# Patient Record
Sex: Female | Born: 1935 | Race: Black or African American | Hispanic: No | Smoking: Former smoker
Health system: Southern US, Community
[De-identification: ages and names within clinical notes are randomized; demographics above are authoritative.]

## PROBLEM LIST (undated history)

## (undated) DIAGNOSIS — K649 Unspecified hemorrhoids: Secondary | ICD-10-CM

## (undated) DIAGNOSIS — I10 Essential (primary) hypertension: Secondary | ICD-10-CM

## (undated) DIAGNOSIS — I4891 Unspecified atrial fibrillation: Secondary | ICD-10-CM

## (undated) DIAGNOSIS — E0520 Thyrotoxicosis with toxic multinodular goiter without thyrotoxic crisis or storm: Secondary | ICD-10-CM

## (undated) HISTORY — DX: Unspecified hemorrhoids: K64.9

## (undated) HISTORY — DX: Essential (primary) hypertension: I10

## (undated) HISTORY — DX: Unspecified atrial fibrillation: I48.91

## (undated) HISTORY — DX: Thyrotoxicosis with toxic multinodular goiter without thyrotoxic crisis or storm: E05.20

---

## 2008-03-16 ENCOUNTER — Inpatient Hospital Stay (HOSPITAL_COMMUNITY): Admission: EM | Admit: 2008-03-16 | Discharge: 2008-03-19 | Payer: Self-pay | Admitting: Emergency Medicine

## 2008-03-16 ENCOUNTER — Encounter (INDEPENDENT_AMBULATORY_CARE_PROVIDER_SITE_OTHER): Payer: Self-pay | Admitting: Emergency Medicine

## 2008-03-16 ENCOUNTER — Ambulatory Visit: Payer: Self-pay | Admitting: Cardiovascular Disease

## 2009-01-12 ENCOUNTER — Encounter: Admission: RE | Admit: 2009-01-12 | Discharge: 2009-01-12 | Payer: Self-pay | Admitting: Internal Medicine

## 2009-05-30 ENCOUNTER — Encounter: Admission: RE | Admit: 2009-05-30 | Discharge: 2009-05-30 | Payer: Self-pay | Admitting: Interventional Cardiology

## 2009-11-30 IMAGING — CT CT CHEST W/ CM
3 series · 18 of 29 positions shown, 19 images · IV contrast (80 ml omni 300)
Comparison: Recent chest radiograph.

CLINICAL DATA: Mediastinal mass seen on chest radiograph.  Chest
pain.

CT CHEST WITH CONTRAST
TECHNIQUE: Multidetector CT imaging of the chest was performed
following the standard protocol during bolus administration of
intravenous contrast.
Contrast: 100 ml [id number]-ZVV

[Series 2: routine chest · axial · 0.59mm/px · z∈[-252,-112]mm · 4 of 58 slices shown, 5 images]
[im 15/58  mediastinal]
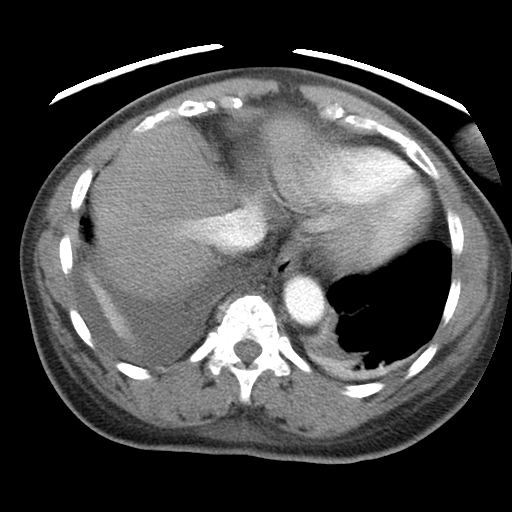
[im 15/58  lung]
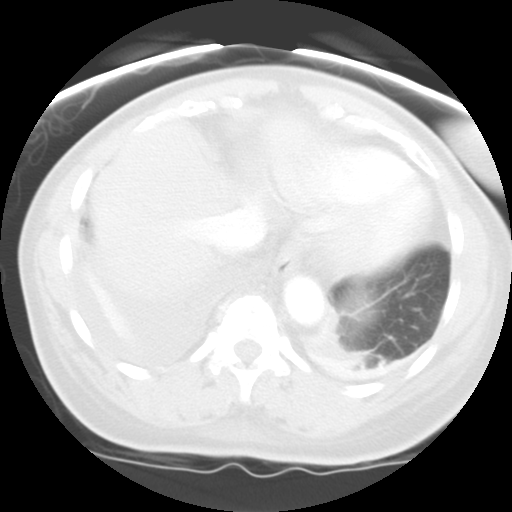
[im 29/58  lung]
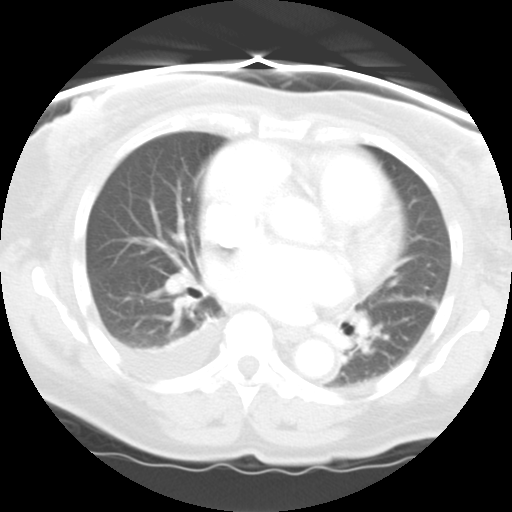
[im 30/58  lung]
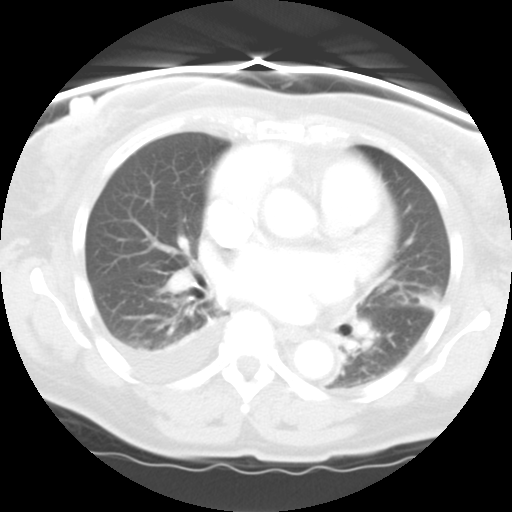
[im 43/58  lung]
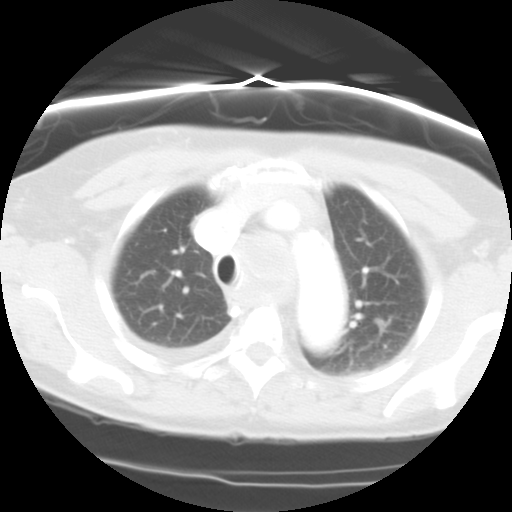

[Series 400: cor chest · coronal · 0.65mm/px · 6 of 93 slices shown]
[im 11/93  lung]
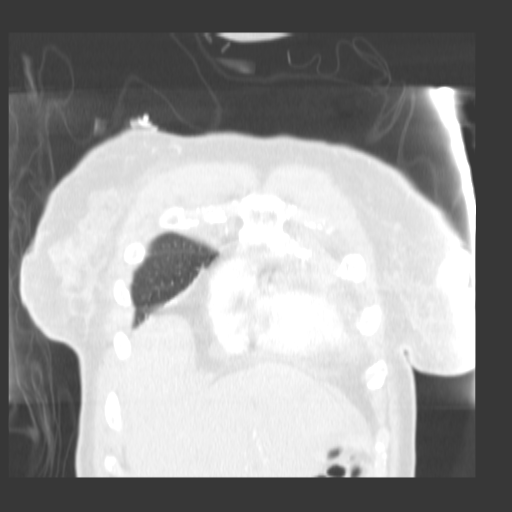
[im 21/93  lung]
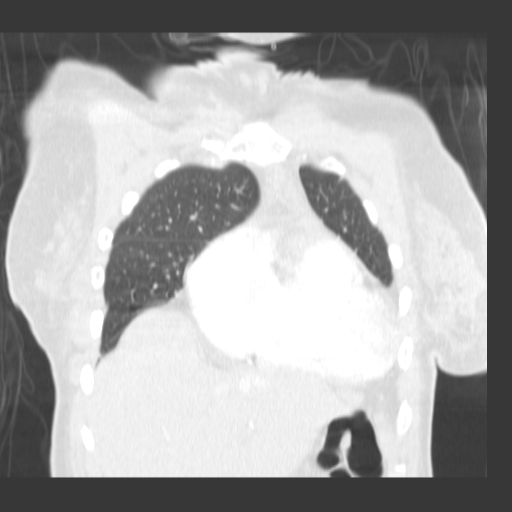
[im 31/93  lung]
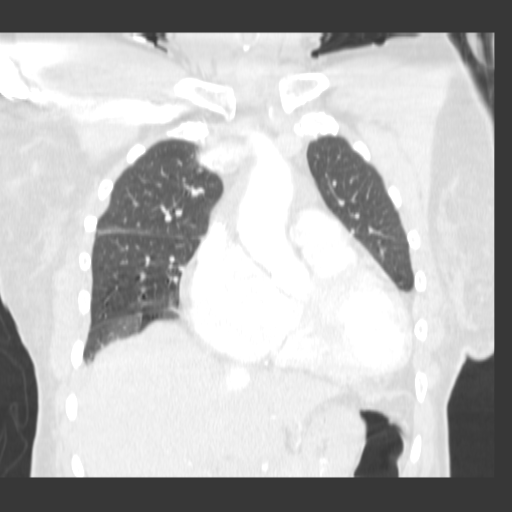
[im 41/93  lung]
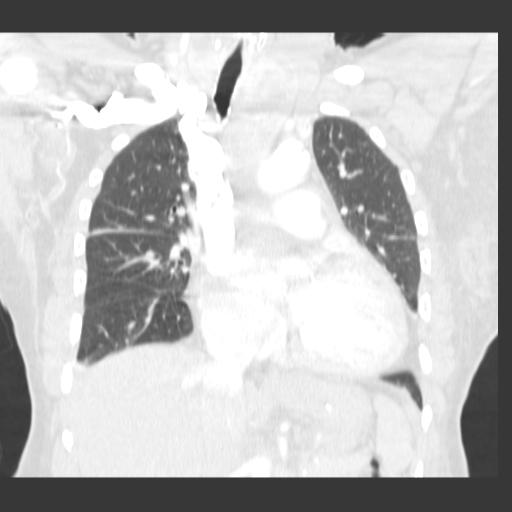
[im 52/93  lung]
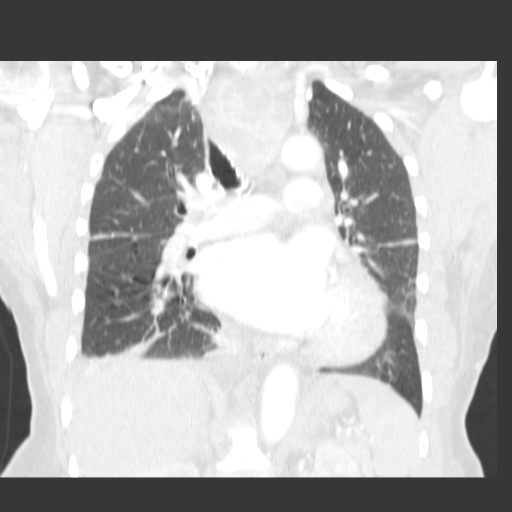
[im 62/93  lung]
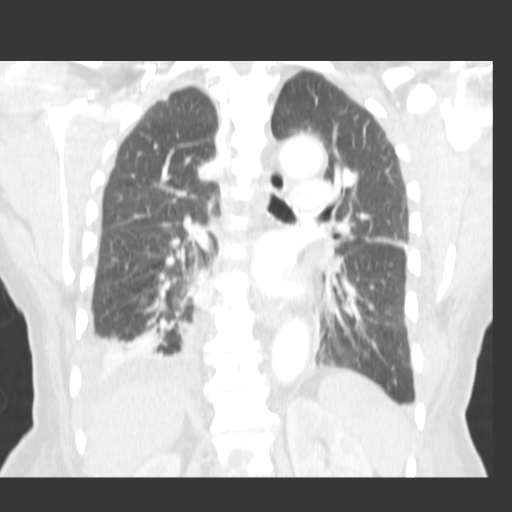

[Series 401: sag chest · sagittal · 0.65mm/px · 8 of 122 slices shown]
[im 11/122  lung]
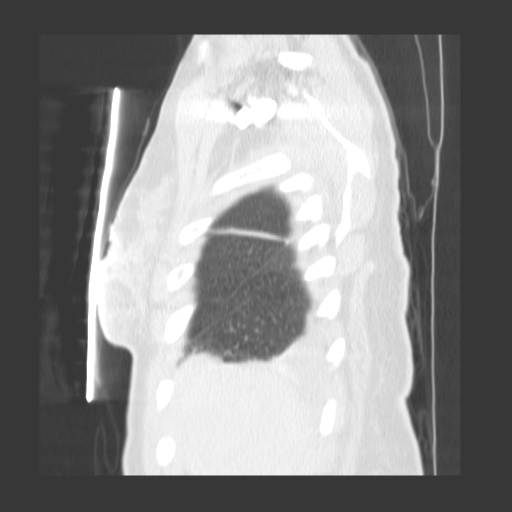
[im 31/122  lung]
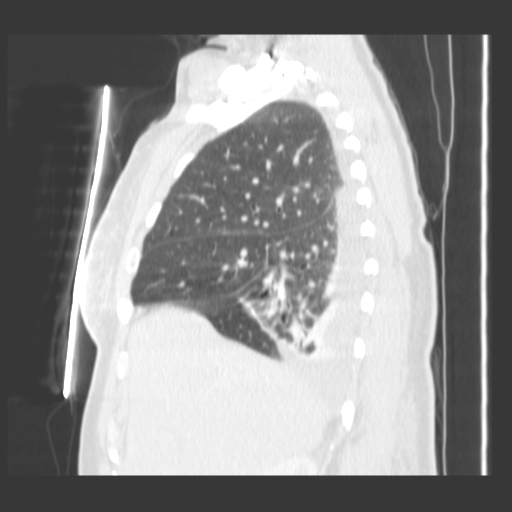
[im 41/122  lung]
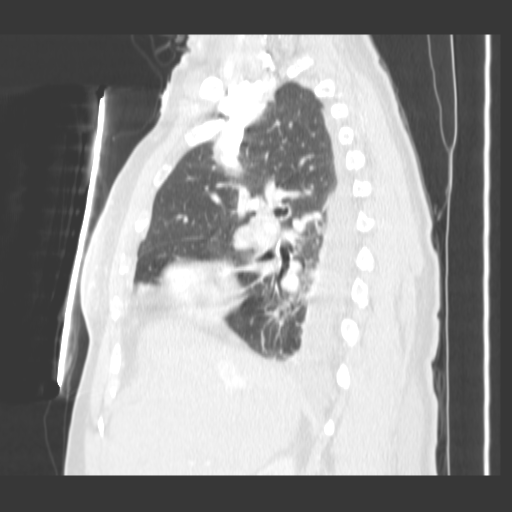
[im 51/122  lung]
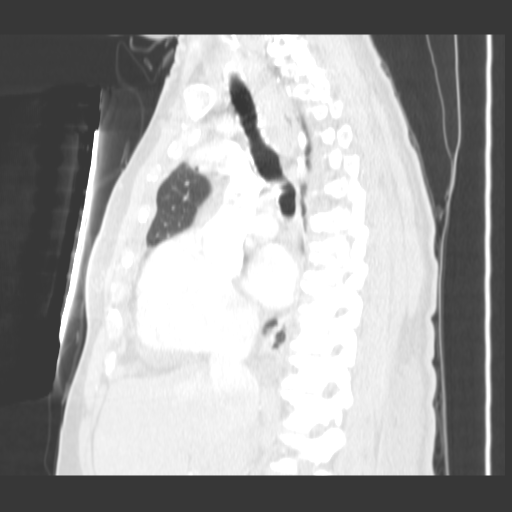
[im 71/122  lung]
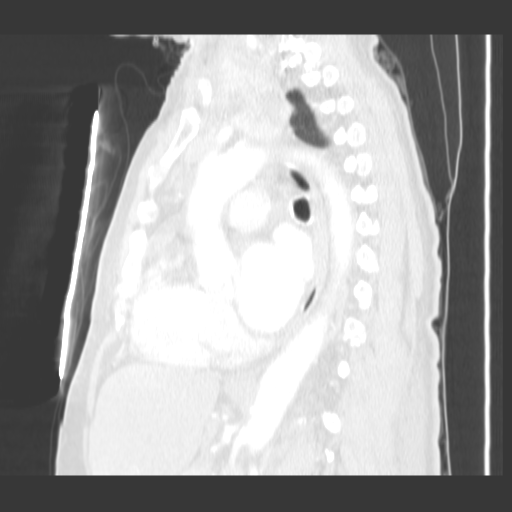
[im 81/122  lung]
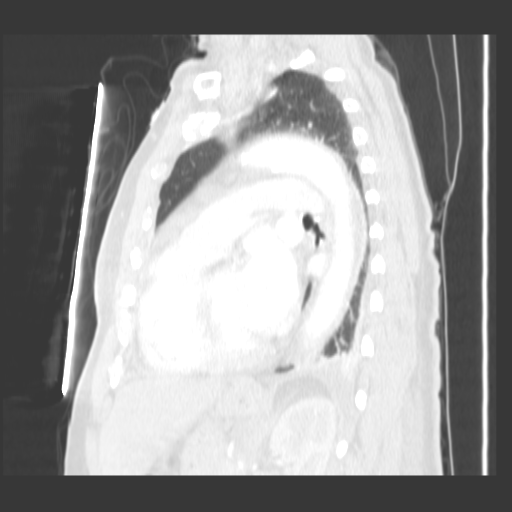
[im 91/122  lung]
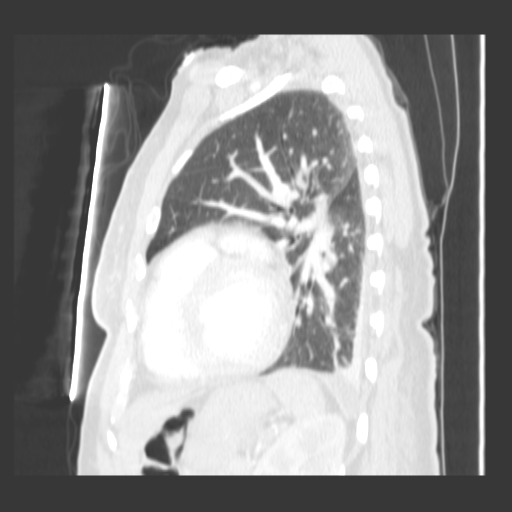
[im 111/122  lung]
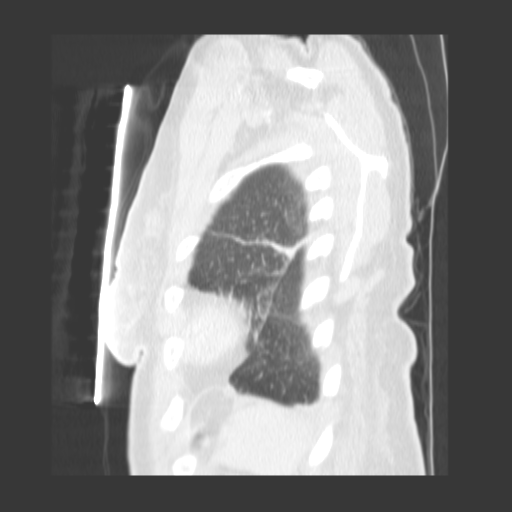

[18 of 29 positions shown; findings below may reference images not displayed]

FINDINGS: Diffuse multinodular goiter is seen.  Substernal
extension is seen resulting in tracheal deviation to the right.

No other mediastinal masses are seen.  There is no evidence of
lymphadenopathy.

Mild cardiomegaly is noted.  Small right pleural effusion is noted
as well as dependent bibasilar atelectasis.  Diffuse body wall
edema is noted.  There is no evidence of pulmonary consolidation or
mass.  Central tracheobronchial airways are patent.

Images obtained to the upper abdomen show partial visualization of
a low attenuation lesion in the lateral segment left hepatic lobe
measuring at least 2.3 cm.  This cannot be characterized on this
exam.
IMPRESSION: 1.  Diffuse multinodular goiter with substernal extension, which
accounts for the mediastinal mass seen on chest radiograph.
2.  Mild cardiomegaly and small right pleural effusion.  Mild
bibasilar atelectasis and diffuse body wall edema.
3.  Nonspecific low attenuation mass in the left hepatic lobe
measuring at least 2.3 cm. Further characterization is recommended
with abdomen MRI without and with contrast. Non-emergent MRI should
be deferred until patient has been discharged for the acute
illness, and can optimally cooperate with positioning and breath-
holding instructions.

## 2010-06-11 LAB — URINALYSIS, ROUTINE W REFLEX MICROSCOPIC
Bilirubin Urine: NEGATIVE
Bilirubin Urine: NEGATIVE
Glucose, UA: NEGATIVE mg/dL
Glucose, UA: NEGATIVE mg/dL
Hgb urine dipstick: NEGATIVE
Hgb urine dipstick: NEGATIVE
Ketones, ur: NEGATIVE mg/dL
Ketones, ur: NEGATIVE mg/dL
Leukocytes, UA: NEGATIVE
Nitrite: NEGATIVE
Nitrite: NEGATIVE
Protein, ur: 30 mg/dL — AB
Protein, ur: NEGATIVE mg/dL
Specific Gravity, Urine: 1.009 (ref 1.005–1.030)
Specific Gravity, Urine: 1.017 (ref 1.005–1.030)
Urobilinogen, UA: 0.2 mg/dL (ref 0.0–1.0)
Urobilinogen, UA: 1 mg/dL (ref 0.0–1.0)
pH: 5.5 (ref 5.0–8.0)
pH: 6 (ref 5.0–8.0)

## 2010-06-11 LAB — DIFFERENTIAL
Basophils Absolute: 0 10*3/uL (ref 0.0–0.1)
Basophils Relative: 0 % (ref 0–1)
Eosinophils Absolute: 0.1 10*3/uL (ref 0.0–0.7)
Eosinophils Relative: 2 % (ref 0–5)
Lymphocytes Relative: 24 % (ref 12–46)
Lymphs Abs: 1.3 10*3/uL (ref 0.7–4.0)
Monocytes Absolute: 0.3 10*3/uL (ref 0.1–1.0)
Monocytes Relative: 6 % (ref 3–12)
Neutro Abs: 3.9 10*3/uL (ref 1.7–7.7)
Neutrophils Relative %: 69 % (ref 43–77)

## 2010-06-11 LAB — CBC
HCT: 37 % (ref 36.0–46.0)
HCT: 38.1 % (ref 36.0–46.0)
Hemoglobin: 12.1 g/dL (ref 12.0–15.0)
Hemoglobin: 12.4 g/dL (ref 12.0–15.0)
Hemoglobin: 12.4 g/dL (ref 12.0–15.0)
MCV: 91.3 fL (ref 78.0–100.0)
Platelets: 185 10*3/uL (ref 150–400)
Platelets: 186 10*3/uL (ref 150–400)
Platelets: 193 10*3/uL (ref 150–400)
RDW: 15.1 % (ref 11.5–15.5)
RDW: 15.3 % (ref 11.5–15.5)
RDW: 15.9 % — ABNORMAL HIGH (ref 11.5–15.5)
WBC: 6 10*3/uL (ref 4.0–10.5)
WBC: 6.6 10*3/uL (ref 4.0–10.5)

## 2010-06-11 LAB — CARDIAC PANEL(CRET KIN+CKTOT+MB+TROPI)
CK, MB: 2 ng/mL (ref 0.3–4.0)
CK, MB: 2.7 ng/mL (ref 0.3–4.0)
Relative Index: INVALID (ref 0.0–2.5)
Total CK: 92 U/L (ref 7–177)
Troponin I: 0.01 ng/mL (ref 0.00–0.06)

## 2010-06-11 LAB — LIPID PANEL
Cholesterol: 109 mg/dL (ref 0–200)
HDL: 36 mg/dL — ABNORMAL LOW (ref >39)
LDL Cholesterol: 65 mg/dL (ref 0–99)
Total CHOL/HDL Ratio: 3 RATIO
Triglycerides: 39 mg/dL (ref <150)
VLDL: 8 mg/dL (ref 0–40)

## 2010-06-11 LAB — POCT CARDIAC MARKERS
CKMB, poc: 1.9 ng/mL (ref 1.0–8.0)
Myoglobin, poc: 73.7 ng/mL (ref 12–200)
Troponin i, poc: <0.05 ng/mL (ref 0.00–0.09)

## 2010-06-11 LAB — CALCIUM: Calcium: 8.6 mg/dL (ref 8.4–10.5)

## 2010-06-11 LAB — BASIC METABOLIC PANEL
BUN: 11 mg/dL (ref 6–23)
CO2: 25 mEq/L (ref 19–32)
Calcium: 8.3 mg/dL — ABNORMAL LOW (ref 8.4–10.5)
Calcium: 8.5 mg/dL (ref 8.4–10.5)
Chloride: 108 mEq/L (ref 96–112)
Creatinine, Ser: 0.78 mg/dL (ref 0.4–1.2)
Creatinine, Ser: 0.91 mg/dL (ref 0.4–1.2)
GFR calc Af Amer: >60 mL/min (ref >60)
GFR calc non Af Amer: >60 mL/min (ref >60)
GFR calc non Af Amer: >60 mL/min (ref >60)
Glucose, Bld: 103 mg/dL — ABNORMAL HIGH (ref 70–99)
Glucose, Bld: 112 mg/dL — ABNORMAL HIGH (ref 70–99)
Potassium: 3.3 mEq/L — ABNORMAL LOW (ref 3.5–5.1)
Potassium: 4 mEq/L (ref 3.5–5.1)
Sodium: 138 mEq/L (ref 135–145)
Sodium: 140 mEq/L (ref 135–145)

## 2010-06-11 LAB — URINE MICROSCOPIC-ADD ON

## 2010-06-11 LAB — HEMOGLOBIN A1C
Hgb A1c MFr Bld: 6 % (ref 4.6–6.1)
Mean Plasma Glucose: 126 mg/dL

## 2010-06-11 LAB — RAPID URINE DRUG SCREEN, HOSP PERFORMED
Amphetamines: NOT DETECTED
Barbiturates: NOT DETECTED
Cocaine: NOT DETECTED
Tetrahydrocannabinol: NOT DETECTED

## 2010-06-11 LAB — T4, FREE: Free T4: 1.13 ng/dL (ref 0.89–1.80)

## 2010-06-11 LAB — PHOSPHORUS: Phosphorus: 3 mg/dL (ref 2.3–4.6)

## 2010-06-11 LAB — CK TOTAL AND CKMB (NOT AT ARMC): Relative Index: 3.8 — ABNORMAL HIGH (ref 0.0–2.5)

## 2010-06-11 LAB — TSH: TSH: 1.112 u[IU]/mL (ref 0.350–4.500)

## 2010-06-11 LAB — TROPONIN I: Troponin I: 0.01 ng/mL (ref 0.00–0.06)

## 2010-06-11 LAB — BRAIN NATRIURETIC PEPTIDE: Pro B Natriuretic peptide (BNP): 214 pg/mL — ABNORMAL HIGH (ref 0.0–100.0)

## 2010-07-10 NOTE — H&P (Signed)
NAME:  Mary Chaney, Mary Chaney NO.:  [id number]   MEDICAL RECORD NO.:  [id number]          PATIENT TYPE:  EMS   LOCATION:  MINO                         FACILITY:  [facility]   PHYSICIAN:  Peggye Pitt, M.D. DATE OF BIRTH:  03/15/1931   DATE OF ADMISSION:  03/16/2008  DATE OF DISCHARGE:                              HISTORY & PHYSICAL   PRIMARY CARE PHYSICIAN:  She does not have one; that makes her  unassigned to [place].   CHIEF COMPLAINT:  Bilateral lower extremity edema.   HISTORY OF PRESENT ILLNESS:  Mary Chaney is a healthy and active 77-year-  old African American woman who has no major past medical history,  although she has not visited a physician in many years.  She states that  since Thursday of last week, which would be exactly 1 week prior to  admission, she started noticing increased bilateral lower extremity  edema.  For that reason, she went to Urgent Care today.  While at Urgent  Care, she was found to be in atrial fibrillation with rapid ventricular  response with a heart rate of around 150, and for that reason she was  transferred to the emergency department for further evaluation.  She  denies any chest pain, shortness of breath, palpitations, diaphoresis or  any other problems.   ALLERGIES:  She has no known drug allergies.   PAST MEDICAL HISTORY:  Noncontributory.   HOME MEDICATIONS:  1. Aspirin 81 mg daily.  2. Some herbal joint medication.   SOCIAL HISTORY:  She denies any alcohol, tobacco or illicit drug use.  Lives by herself.  Has two daughters.  Is now retired.   FAMILY HISTORY:  Nonsignificant.  She does have a daughter with  hypertension.   REVIEW OF SYSTEMS:  Negative except as already mentioned in HPI.   PHYSICAL EXAMINATION:  VITAL SIGNS:  Upon admission, blood pressure  147/96, heart rate 147, respirations 20, O2 saturations 99% on 2 liters  with a temperature of 97.6.  GENERAL:  She is awake and oriented x3.  Does not appear to be in  any  distress.  HEENT:  Normocephalic, atraumatic.  Her pupils are equal, reactive to  light and accommodation with intact extraocular movements.  NECK:  Supple.  No JVD, no lymphadenopathy, no bruits, no goiter.  LUNGS:  She has some bibasilar crackles.  ABDOMEN:  Soft, nontender, nondistended with positive bowel sounds.  EXTREMITIES:  She has 3+ pitting edema bilaterally up to her mid thighs.  NEUROLOGIC:  Grossly intact and nonfocal.   LABORATORY DATA:  Upon admission, sodium 140, potassium 4.0, chloride  108, bicarbonate 25, BUN 11, creatinine 0.78, glucose of 112.  WBCs 5.6,  hemoglobin 12.4, platelets of 185.  Urinalysis is negative.  First set  of point of care markers is negative.  BNP of 214.  Chest x-ray  shows  cardiomegaly with right basilar atelectasis versus infiltrate, probable  mild congestive/early edema.  EKG showing atrial fibrillation with rapid  ventricular response.   ASSESSMENT AND PLAN:  New onset atrial fibrillation with rapid  ventricular response.  Will admit to the  step-down unit.  Will start her  on IV Cardizem.  She has already received a bolus in the ED and will  continue to titrate her drip for a goal heart rate between 60 and 90.  Will also start her on Lopressor 12.5 mg twice daily.  Will obtain a 2-D  echo looking for structural abnormalities.  We will also rule out  hyperthyroidism with a TSH.  Will also cycle EKGs and cardiac enzymes  just to make sure an acute coronary syndrome is not the cause of her new  onset atrial fibrillation.  Will also check a magnesium level.  Her  potassium is within normal limits.  Will also need to make further  decisions as to whether or not her CHADS score qualifies her to be on  Coumadin.  At this point, she only has one point for her age.  However,  this will be further elucidated by her 2-D echo which will tell us if  she indeed has heart failure or not, as well as I have ordered an A1c to  see if she might have  diabetes, and she may have high blood pressure  with a slightly elevated blood pressure here today.  We will see how her  blood pressure evolves while in the hospital.  I suspect she will likely  need Coumadin prior to discharge.  At this point in time, we will place  her on full doses of Lovenox, pending completion of workup.  As for her  mild congestive heart failure as per EKG, will start her on Lasix,  strict Is and Os and daily weights.  Will check a 2-D echo, and further  diuresis will be mandated by her Is and Os.  For prophylaxis while in  the hospital, she will be on Protonix for GI prophylaxis and on Lovenox  for DVT prophylaxis.      Peggye Pitt, M.D.  Electronically Signed     EH/MEDQ  D:  03/16/2008  T:  03/16/2008  Job:  [id number]

## 2010-07-10 NOTE — Discharge Summary (Signed)
Mary Chaney, DRUM NO.:  [id number]   MEDICAL RECORD NO.:  [id number]          PATIENT TYPE:  INP   LOCATION:  2922                         FACILITY:  [facility]   PHYSICIAN:  Peggye Pitt, M.D. DATE OF BIRTH:  03/15/1931   DATE OF ADMISSION:  03/16/2008  DATE OF DISCHARGE:  03/19/2008                               DISCHARGE SUMMARY   DISCHARGE DIAGNOSES:  1. Atrial fibrillation with rapid ventricular response, rate      controlled, with a CHADS score of 1.  2. Large multinodular goiter, euthyroid.  3. Low-attenuation mass in the left hepatic lobe measuring 2.3 cm,      nonemergent MRI with and without contrast as an outpatient      recommended by Radiology.   DISCHARGE MEDICATIONS:  1. Lopressor 12.5 mg twice daily.  2. Cardizem 60 mg 4 times a day.  3. Lasix 40 mg daily.  4. Aspirin 81 mg daily.   CONSULTATION THIS HOSPITALIZATION:  None.   IMAGES AND PROCEDURES PERFORMED THIS HOSPITALIZATION:  1. A chest x-ray on March 16, 2008 that showed a right basilar      atelectasis or infiltrate with mild congestion, early edema.  2. A repeat chest x-ray on March 18, 2008 that showed improved edema      with a left superior mediastinal mass with resulting tracheal      deviation to the right.  3. A CT scan of the chest with contrast on March 18, 2008 that      showed a diffuse multinodular goiter with substernal extension,      which accounts for the mediastinal mass seen on chest x-ray.  Mild      right pleural effusion and mild bibasilar atelectasis.  A      nonspecific low-attenuation mass in the left hepatic lobe measuring      2.3 cm.  Further characterization is recommended with abdominal MRI      with and without contrast that can be deferred until after      discharge.  4. The patient had a 2-D echo on March 16, 2008 that showed an      ejection fraction of 55% with left ventricular wall thickness at      the upper limits of normal with a  trivial posterior pericardial      effusion.   HISTORY AND PHYSICAL EXAMINATION:  For full details, please refer to  history and physical dictated by myself on March 16, 2008, but in  brief Mary Chaney is a very healthy 77 year old woman with no major past  medical history, who stated that for about a week prior to admission,  she started noticing bilateral lower extremity edema.  When she came  into the emergency department, she was found to be in atrial  fibrillation with rapid ventricular response.  For that reason, the  Hospital Service was consulted for admission for further evaluation and  management.   HOSPITAL COURSE:  1. Atrial fibrillation.  Initially with a rapid ventricular response,      she was started on IV Cardizem.  First dose given as  a bolus and      then placed on maintenance drip.  Her IV Cardizem was subsequently      transitioned over to oral Cardizem, which she had been tolerating      well and maintaining heart rates in the 80s to 90s.  I have also      added Lopressor 12.5 mg twice daily to assist with rate control.      Please note that her CHADS score is only 1 and 1 point given for      her age.  She does not have any evidence of hypertension, diabetes,      stroke, or any congestive heart failure.  For that reason, we have      elected to not start her on warfarin and just start her on aspirin.      She needs close followup with her primary care physician and may be      at that point, her Cardizem can to be transitioned over to extended      release Cardizem for ease of dosing.  2. For her large multinodular goiter, this is euthyroid with normal      TSH, free T4, and T3.  I have called Surgery, and they have      recommended that she followup as an outpatient for resection of      this mass.  Although she does have tracheal deviation to the right,      she does not have any current airway or esophageal compromise.  I      have advised them, however, to  make this appointment fairly quickly      following her discharge.  3. For her left hepatic lobe mass that was seen on her CT scan, the      radiologist as recommended that an outpatient MRI be done and this      can certainly be done when she follows up with her primary care      physician.   DISPOSITION:  Please note that the patient does not currently have a  primary care physician.  She has elected to go with the [facility] at  [place].  I have provided them with her number and asked them to call  on Monday to schedule a followup with both new primary care physician  and with Dr. Gerrit Friends, Surgery for her thyroid resection.   VITAL SIGNS ON DISCHARGE:  Blood pressure 130/62, heart rate 85-100,  respirations 20, and O2 sats 95% on room air with a temperature of 100.   LABORATORIES ON DAY OF DISCHARGE:  WBCs of 6.0, hemoglobin 12.4, and  platelets of 186.      Peggye Pitt, M.D.  Electronically Signed     EH/MEDQ  D:  03/19/2008  T:  03/19/2008  Job:  [id number]   cc:   Velora Heckler, MD

## 2011-03-12 DIAGNOSIS — E0520 Thyrotoxicosis with toxic multinodular goiter without thyrotoxic crisis or storm: Secondary | ICD-10-CM | POA: Diagnosis not present

## 2011-03-19 DIAGNOSIS — E0520 Thyrotoxicosis with toxic multinodular goiter without thyrotoxic crisis or storm: Secondary | ICD-10-CM | POA: Diagnosis not present

## 2011-04-23 DIAGNOSIS — E0520 Thyrotoxicosis with toxic multinodular goiter without thyrotoxic crisis or storm: Secondary | ICD-10-CM | POA: Diagnosis not present

## 2011-06-18 DIAGNOSIS — E0520 Thyrotoxicosis with toxic multinodular goiter without thyrotoxic crisis or storm: Secondary | ICD-10-CM | POA: Diagnosis not present

## 2011-06-25 DIAGNOSIS — E0580 Other thyrotoxicosis without thyrotoxic crisis or storm: Secondary | ICD-10-CM | POA: Diagnosis not present

## 2011-06-25 DIAGNOSIS — E0520 Thyrotoxicosis with toxic multinodular goiter without thyrotoxic crisis or storm: Secondary | ICD-10-CM | POA: Diagnosis not present

## 2011-06-26 DIAGNOSIS — I4891 Unspecified atrial fibrillation: Secondary | ICD-10-CM | POA: Diagnosis not present

## 2011-06-26 DIAGNOSIS — I10 Essential (primary) hypertension: Secondary | ICD-10-CM | POA: Diagnosis not present

## 2011-06-26 DIAGNOSIS — Z79899 Other long term (current) drug therapy: Secondary | ICD-10-CM | POA: Diagnosis not present

## 2011-06-26 DIAGNOSIS — Z7901 Long term (current) use of anticoagulants: Secondary | ICD-10-CM | POA: Diagnosis not present

## 2011-09-24 DIAGNOSIS — Z79899 Other long term (current) drug therapy: Secondary | ICD-10-CM | POA: Diagnosis not present

## 2011-09-24 DIAGNOSIS — E0520 Thyrotoxicosis with toxic multinodular goiter without thyrotoxic crisis or storm: Secondary | ICD-10-CM | POA: Diagnosis not present

## 2011-12-24 DIAGNOSIS — E0520 Thyrotoxicosis with toxic multinodular goiter without thyrotoxic crisis or storm: Secondary | ICD-10-CM | POA: Diagnosis not present

## 2011-12-31 DIAGNOSIS — E0520 Thyrotoxicosis with toxic multinodular goiter without thyrotoxic crisis or storm: Secondary | ICD-10-CM | POA: Diagnosis not present

## 2011-12-31 DIAGNOSIS — Z23 Encounter for immunization: Secondary | ICD-10-CM | POA: Diagnosis not present

## 2011-12-31 DIAGNOSIS — E0580 Other thyrotoxicosis without thyrotoxic crisis or storm: Secondary | ICD-10-CM | POA: Diagnosis not present

## 2012-01-06 DIAGNOSIS — Z79899 Other long term (current) drug therapy: Secondary | ICD-10-CM | POA: Diagnosis not present

## 2012-01-06 DIAGNOSIS — I4891 Unspecified atrial fibrillation: Secondary | ICD-10-CM | POA: Diagnosis not present

## 2012-01-06 DIAGNOSIS — I5033 Acute on chronic diastolic (congestive) heart failure: Secondary | ICD-10-CM | POA: Diagnosis not present

## 2012-01-06 DIAGNOSIS — I10 Essential (primary) hypertension: Secondary | ICD-10-CM | POA: Diagnosis not present

## 2012-11-21 ENCOUNTER — Other Ambulatory Visit: Payer: Self-pay | Admitting: Interventional Cardiology

## 2012-11-21 DIAGNOSIS — Z79899 Other long term (current) drug therapy: Secondary | ICD-10-CM

## 2012-12-29 ENCOUNTER — Encounter: Payer: Self-pay | Admitting: Cardiology

## 2012-12-29 DIAGNOSIS — I10 Essential (primary) hypertension: Secondary | ICD-10-CM | POA: Insufficient documentation

## 2012-12-29 DIAGNOSIS — K649 Unspecified hemorrhoids: Secondary | ICD-10-CM

## 2012-12-29 DIAGNOSIS — E0520 Thyrotoxicosis with toxic multinodular goiter without thyrotoxic crisis or storm: Secondary | ICD-10-CM

## 2012-12-29 DIAGNOSIS — I4891 Unspecified atrial fibrillation: Secondary | ICD-10-CM | POA: Insufficient documentation

## 2012-12-30 ENCOUNTER — Other Ambulatory Visit: Payer: Medicare HMO

## 2012-12-30 ENCOUNTER — Encounter (INDEPENDENT_AMBULATORY_CARE_PROVIDER_SITE_OTHER): Payer: Self-pay

## 2012-12-30 ENCOUNTER — Ambulatory Visit (INDEPENDENT_AMBULATORY_CARE_PROVIDER_SITE_OTHER): Payer: Medicare HMO | Admitting: Interventional Cardiology

## 2012-12-30 ENCOUNTER — Encounter: Payer: Self-pay | Admitting: Interventional Cardiology

## 2012-12-30 VITALS — BP 140/80 | HR 53 | Ht 60.0 in | Wt 144.0 lb

## 2012-12-30 DIAGNOSIS — Z79899 Other long term (current) drug therapy: Secondary | ICD-10-CM

## 2012-12-30 DIAGNOSIS — I10 Essential (primary) hypertension: Secondary | ICD-10-CM

## 2012-12-30 DIAGNOSIS — Z7901 Long term (current) use of anticoagulants: Secondary | ICD-10-CM

## 2012-12-30 DIAGNOSIS — I4891 Unspecified atrial fibrillation: Secondary | ICD-10-CM

## 2012-12-30 LAB — HEPATIC FUNCTION PANEL
AST: 17 U/L (ref 0–37)
Alkaline Phosphatase: 78 U/L (ref 39–117)
Bilirubin, Direct: 0.1 mg/dL (ref 0.0–0.3)

## 2012-12-30 NOTE — Progress Notes (Signed)
Patient ID: Mary Chaney, female   DOB: February 01, 1928, 78 y.o.   MRN: [id number]    [address] [place], [place]  [address] Phone: [phone] Fax:  [phone]  Date:  12/30/2012   ID:  Mary Chaney, DOB 1927/05/20, MRN [id number]  PCP:  No PCP Per Patient   ASSESSMENT:  1. History of atrial fibrillation, but now with rhythm control on amiodarone therapy 2. Amiodarone therapy 3. Hypertension under good control  PLAN:  1. Continue amiodarone therapy as listed 2. TSH and hepatic panel along with chest x-ray in 6 months on return 3. Maintain an active lifestyle   SUBJECTIVE: Mary Chaney is a 79 y.o. female is doing well. No episodes of fatigue of palpitation. She denies lower extremity swelling. She works out 3 times per week. He denies lightheadedness, dizziness, and transient neurological symptoms. She perceives no medication side effects.   Wt Readings from Last 3 Encounters:  12/30/12 144 lb (65.318 kg)     Past Medical History  Diagnosis Date  . Atrial fibrillation   . Hypertension   . Toxic multinodular goiter   . Hemorrhoids     Current Outpatient Prescriptions  Medication Sig Dispense Refill  . amiodarone (PACERONE) 200 MG tablet Take 100 mg by mouth daily.      Marland Kitchen amLODipine (NORVASC) 2.5 MG tablet Take 2.5 mg by mouth daily.      Marland Kitchen aspirin 81 MG tablet Take 81 mg by mouth daily.      . COLLAGEN-VITAMIN C PO Take by mouth. Take 3 pills daily as diected      . methimazole (TAPAZOLE) 10 MG tablet Take 15 mg by mouth 3 (three) times daily.      . Misc Natural Products (SM GLUCOSAMINE CHONDROITIN MSM PO) Take by mouth. Take at least 3 pills daily for knee joint problem      . valsartan-hydrochlorothiazide (DIOVAN-HCT) 320-25 MG per tablet Take 1 tablet by mouth daily.       No current facility-administered medications for this visit.    Allergies:    Allergies  Allergen Reactions  . Bee Venom     Social History:  The patient  reports  that she quit smoking about 29 years ago. She does not have any smokeless tobacco history on file. She reports that she does not drink alcohol or use illicit drugs.   ROS:  Please see the history of present illness.   Denies edema, bleeding, palpitations, syncope, and neurological complaints   All other systems reviewed and negative.   OBJECTIVE: VS:  BP 140/80  Pulse 53  Ht 5' (1.524 m)  Wt 144 lb (65.318 kg)  BMI 28.12 kg/m2  SpO2 94% Well nourished, well developed, in no acute distress, appearing younger than stated age HEENT: normal Neck: JVD flat. Carotid bruit absent  Cardiac:  normal S1, S2; RRR; no murmur Lungs:  clear to auscultation bilaterally, no wheezing, rhonchi or rales Abd: soft, nontender, no hepatomegaly Ext: Edema absent. Pulses trace to 1+ bilateral Skin: warm and dry Neuro:  CNs 2-12 intact, no focal abnormalities noted  EKG:  Sinus bradycardia 53 beats per minute with otherwise no abnormalities noted       Signed, Darci Needle III, MD 12/30/2012 3:30 PM

## 2012-12-30 NOTE — Patient Instructions (Signed)
Your physician recommends that you continue on your current medications as directed. Please refer to the Current Medication list given to you today.  Your physician wants you to follow-up in: 6 months You will receive a reminder letter in the mail two months in advance. If you don't receive a letter, please call our office to schedule the follow-up appointment.  Labs today Tsh,Hepatic  A chest x-ray takes a picture of the organs and structures inside the chest, including the heart, lungs, and blood vessels. This test can show several things, including, whether the heart is enlarges; whether fluid is building up in the lungs; and whether pacemaker / defibrillator leads are still in place.

## 2012-12-31 ENCOUNTER — Ambulatory Visit
Admission: RE | Admit: 2012-12-31 | Discharge: 2012-12-31 | Disposition: A | Discharge status: Home / Self Care | Payer: Medicare HMO | Source: Ambulatory Visit | Attending: Interventional Cardiology | Admitting: Interventional Cardiology

## 2012-12-31 DIAGNOSIS — Z79899 Other long term (current) drug therapy: Secondary | ICD-10-CM

## 2013-01-01 ENCOUNTER — Telehealth: Payer: Self-pay

## 2013-01-01 NOTE — Telephone Encounter (Signed)
pt made ware of labs pt verbalized undertstanding

## 2013-01-01 NOTE — Telephone Encounter (Signed)
Message copied by Jarvis Newcomer on Fri Jan 01, 2013  9:46 AM ------      Message from: Verdis Prime      Created: Thu Dec 31, 2012  3:59 PM       Liver and thyroid are normal. ------

## 2013-01-01 NOTE — Telephone Encounter (Signed)
pt aware chest xray normal.X ray is normal with no evidence of lung damage or CHF.pt verbalized understanding

## 2013-01-01 NOTE — Telephone Encounter (Signed)
Message copied by Jarvis Newcomer on Fri Jan 01, 2013  9:32 AM ------      Message from: Verdis Prime      Created: Thu Dec 31, 2012  3:53 PM       X ray is normal with no evidence of lung damage or CHF. ------

## 2013-01-15 ENCOUNTER — Other Ambulatory Visit: Payer: Self-pay

## 2013-01-15 MED ORDER — AMLODIPINE BESYLATE 2.5 MG PO TABS: 2.5000 mg | ORAL_TABLET | Freq: Every day | ORAL | Status: DC

## 2013-03-19 ENCOUNTER — Other Ambulatory Visit: Payer: Self-pay | Admitting: *Deleted

## 2013-03-19 MED ORDER — AMIODARONE HCL 200 MG PO TABS: 100.0000 mg | ORAL_TABLET | Freq: Every day | ORAL | Status: DC

## 2013-06-06 ENCOUNTER — Encounter: Payer: Self-pay | Admitting: *Deleted

## 2013-06-11 ENCOUNTER — Other Ambulatory Visit: Payer: Self-pay

## 2013-06-11 MED ORDER — VALSARTAN-HYDROCHLOROTHIAZIDE 320-25 MG PO TABS: 1.0000 | ORAL_TABLET | Freq: Every day | ORAL | Status: DC

## 2013-07-12 ENCOUNTER — Other Ambulatory Visit: Payer: Medicare HMO

## 2013-07-12 ENCOUNTER — Ambulatory Visit (INDEPENDENT_AMBULATORY_CARE_PROVIDER_SITE_OTHER): Payer: Medicare HMO | Admitting: Interventional Cardiology

## 2013-07-12 ENCOUNTER — Telehealth: Payer: Self-pay | Admitting: Interventional Cardiology

## 2013-07-12 ENCOUNTER — Ambulatory Visit
Admission: RE | Admit: 2013-07-12 | Discharge: 2013-07-12 | Disposition: A | Discharge status: Home / Self Care | Payer: Medicare HMO | Source: Ambulatory Visit | Attending: Interventional Cardiology | Admitting: Interventional Cardiology

## 2013-07-12 ENCOUNTER — Encounter: Payer: Self-pay | Admitting: Interventional Cardiology

## 2013-07-12 VITALS — BP 130/88 | HR 50 | Ht 60.0 in | Wt 140.0 lb

## 2013-07-12 DIAGNOSIS — Z79899 Other long term (current) drug therapy: Secondary | ICD-10-CM

## 2013-07-12 DIAGNOSIS — I10 Essential (primary) hypertension: Secondary | ICD-10-CM

## 2013-07-12 DIAGNOSIS — I4891 Unspecified atrial fibrillation: Secondary | ICD-10-CM

## 2013-07-12 DIAGNOSIS — Z7901 Long term (current) use of anticoagulants: Secondary | ICD-10-CM

## 2013-07-12 DIAGNOSIS — Z9229 Personal history of other drug therapy: Secondary | ICD-10-CM | POA: Insufficient documentation

## 2013-07-12 DIAGNOSIS — K649 Unspecified hemorrhoids: Secondary | ICD-10-CM

## 2013-07-12 LAB — TSH: TSH: 0.2 u[IU]/mL — AB (ref 0.35–4.50)

## 2013-07-12 LAB — HEPATIC FUNCTION PANEL
ALBUMIN: 3.8 g/dL (ref 3.5–5.2)
ALK PHOS: 68 U/L (ref 39–117)
ALT: 14 U/L (ref 0–35)
AST: 18 U/L (ref 0–37)
Bilirubin, Direct: 0.1 mg/dL (ref 0.0–0.3)
Total Bilirubin: 0.6 mg/dL (ref 0.2–1.2)
Total Protein: 7.5 g/dL (ref 6.0–8.3)

## 2013-07-12 NOTE — Telephone Encounter (Signed)
pt adv that Dr.Smith would like her to f/u in 88mo.recall letter in.pt to call and schedule when letter id recieved.pt verbalized understanding.

## 2013-07-12 NOTE — Progress Notes (Signed)
Patient ID: Mary MomentJannie W Chaney, female   DOB: 1927/05/05, 80 y.o.   MRN: [id number]    [address] [place], [place]  [id number] Phone: [phone] Fax:  [phone]  Date:  07/12/2013   ID:  Mary Chaney, DOB 1927/05/05, MRN [id number]  PCP:  No PCP Per Patient   ASSESSMENT:  1. Paroxysmal atrial fibrillation, controlled on amiodarone 2. Chronic amiodarone therapy 3. Chronic anticoagulation therapy is not being used 4. Hypertension  PLAN:  1. continue amiodarone at the current dose of 100 mg per day 2. Continue aerobic activity 3. TSH and hepatic panel today 4. Clinical followup in one year   SUBJECTIVE: Mary Chaney is a 80 y.o. female who has no cardio or pulmonary complaints. She has decreased her exertional activities because of arthritis and joint complaints. She has not had syncope. She denies prolonged palpitations   Wt Readings from Last 3 Encounters:  07/12/13 140 lb (63.504 kg)  12/30/12 144 lb (65.318 kg)     Past Medical History  Diagnosis Date  . Atrial fibrillation   . Hypertension   . Toxic multinodular goiter   . Hemorrhoids     Current Outpatient Prescriptions  Medication Sig Dispense Refill  . amiodarone (PACERONE) 200 MG tablet Take 0.5 tablets (100 mg total) by mouth daily.  15 tablet  5  . amLODipine (NORVASC) 2.5 MG tablet Take 1 tablet (2.5 mg total) by mouth daily.  30 tablet  6  . aspirin 81 MG tablet Take 81 mg by mouth daily.      . COLLAGEN-VITAMIN C PO Take by mouth. Take 3 pills daily as diected      . methimazole (TAPAZOLE) 10 MG tablet Take 15 mg by mouth 3 (three) times daily.      . Misc Natural Products (SM GLUCOSAMINE CHONDROITIN MSM PO) Take by mouth. Take at least 3 pills daily for knee joint problem      . valsartan-hydrochlorothiazide (DIOVAN-HCT) 320-25 MG per tablet Take 1 tablet by mouth daily.  30 tablet  1   No current facility-administered medications for this visit.    Allergies:    Allergies    Allergen Reactions  . Bee Venom     Social History:  The patient  reports that she quit smoking about 30 years ago. She does not have any smokeless tobacco history on file. She reports that she does not drink alcohol or use illicit drugs.   ROS:  Please see the history of present illness.   No transient neurological symptoms   All other systems reviewed and negative.   OBJECTIVE: VS:  BP 130/88  Pulse 50  Ht 5' (1.524 m)  Wt 140 lb (63.504 kg)  BMI 27.34 kg/m2 Well nourished, well developed, in no acute distress, younger than stated age HEENT: normal Neck: JVD flat. Carotid bruit 2+  Cardiac:  normal S1, S2; RRR; no murmur Lungs:  clear to auscultation bilaterally, no wheezing, rhonchi or rales Abd: soft, nontender, no hepatomegaly Ext: Edema absent. Pulses 2+ Skin: warm and dry Neuro:  CNs 2-12 intact, no focal abnormalities noted  EKG:  Not repeated       Signed, Darci NeedleHenry W. B. Amelya Mabry III, MD 07/12/2013 11:50 AM

## 2013-07-12 NOTE — Patient Instructions (Signed)
Your physician recommends that you continue on your current medications as directed. Please refer to the Current Medication list given to you today.  Lab today: Tsh, Hepatic  A chest x-ray takes a picture of the organs and structures inside the chest, including the heart, lungs, and blood vessels. This test can show several things, including, whether the heart is enlarges; whether fluid is building up in the lungs; and whether pacemaker / defibrillator leads are still in place.( [facility] [address] floor)  Your physician wants you to follow-up in: 6-8 months You will receive a reminder letter in the mail two months in advance. If you don't receive a letter, please call our office to schedule the follow-up appointment.

## 2013-07-12 NOTE — Telephone Encounter (Signed)
New problem    Pt want to know when does she need to come back for her f/u. Please call pt.

## 2013-07-14 ENCOUNTER — Telehealth: Payer: Self-pay

## 2013-07-14 NOTE — Telephone Encounter (Signed)
Message copied by Jarvis NewcomerPARRIS-GODLEY, LISA S on Wed Jul 14, 2013  4:03 PM ------      Message from: Verdis PrimeSMITH, HENRY      Created: Tue Jul 13, 2013  7:35 PM       Thyroid is overactive. Send copy to Dr. Sharl MaKerr ------

## 2013-07-14 NOTE — Telephone Encounter (Signed)
Message copied by Jarvis NewcomerPARRIS-GODLEY, Dontavius Keim S on Wed Jul 14, 2013  4:04 PM ------      Message from: Verdis PrimeSMITH, HENRY      Created: Tue Jul 13, 2013  7:35 PM       Thyroid is overactive. Send copy to Dr. Sharl MaKerr ------

## 2013-07-14 NOTE — Telephone Encounter (Signed)
pt aware of cxr results are ok.pt  tsh lab results.Thyroid is overactive.will fwd copy to Dr. Sharl MaKerr.pt should f/u with him.pt verbalized understanding.

## 2013-09-01 ENCOUNTER — Other Ambulatory Visit: Payer: Self-pay | Admitting: Interventional Cardiology

## 2013-09-02 ENCOUNTER — Other Ambulatory Visit: Payer: Self-pay

## 2013-09-02 MED ORDER — AMIODARONE HCL 200 MG PO TABS: 100.0000 mg | ORAL_TABLET | Freq: Every day | ORAL | Status: DC

## 2013-09-07 ENCOUNTER — Other Ambulatory Visit: Payer: Self-pay

## 2013-09-07 MED ORDER — AMLODIPINE BESYLATE 2.5 MG PO TABS: 2.5000 mg | ORAL_TABLET | Freq: Every day | ORAL | Status: DC

## 2013-09-07 MED ORDER — VALSARTAN-HYDROCHLOROTHIAZIDE 320-25 MG PO TABS: 1.0000 | ORAL_TABLET | Freq: Every day | ORAL | Status: DC

## 2013-10-04 ENCOUNTER — Emergency Department (INDEPENDENT_AMBULATORY_CARE_PROVIDER_SITE_OTHER)
Admission: EM | Admit: 2013-10-04 | Discharge: 2013-10-04 | Disposition: A | Discharge status: Home / Self Care | Payer: Medicare HMO | Source: Home / Self Care | Attending: Family Medicine | Admitting: Family Medicine

## 2013-10-04 ENCOUNTER — Encounter (HOSPITAL_COMMUNITY): Payer: Self-pay | Admitting: Emergency Medicine

## 2013-10-04 DIAGNOSIS — T63461A Toxic effect of venom of wasps, accidental (unintentional), initial encounter: Secondary | ICD-10-CM

## 2013-10-04 DIAGNOSIS — T7840XA Allergy, unspecified, initial encounter: Secondary | ICD-10-CM

## 2013-10-04 DIAGNOSIS — T6391XA Toxic effect of contact with unspecified venomous animal, accidental (unintentional), initial encounter: Secondary | ICD-10-CM

## 2013-10-04 MED ORDER — FAMOTIDINE 20 MG PO TABS
20.0000 mg | ORAL_TABLET | Freq: Once | ORAL | Status: AC
  Administered 2013-10-04: 20 mg via ORAL

## 2013-10-04 MED ORDER — PREDNISONE 50 MG PO TABS: ORAL_TABLET | ORAL | Status: DC

## 2013-10-04 MED ORDER — EPINEPHRINE 0.3 MG/0.3ML IJ SOAJ: 0.3000 mg | Freq: Once | INTRAMUSCULAR | Status: DC

## 2013-10-04 MED ORDER — METHYLPREDNISOLONE SODIUM SUCC 125 MG IJ SOLR
125.0000 mg | Freq: Once | INTRAMUSCULAR | Status: AC
  Administered 2013-10-04: 125 mg via INTRAMUSCULAR

## 2013-10-04 MED ORDER — METHYLPREDNISOLONE SODIUM SUCC 125 MG IJ SOLR
INTRAMUSCULAR | Status: AC
  Filled 2013-10-04: qty 2

## 2013-10-04 MED ORDER — DIPHENHYDRAMINE HCL 50 MG/ML IJ SOLN: 25.0000 mg | Freq: Once | INTRAMUSCULAR | Status: DC

## 2013-10-04 MED ORDER — DIPHENHYDRAMINE HCL 50 MG/ML IJ SOLN
25.0000 mg | Freq: Once | INTRAMUSCULAR | Status: AC
  Administered 2013-10-04: 25 mg via INTRAMUSCULAR

## 2013-10-04 MED ORDER — FAMOTIDINE 20 MG PO TABS
ORAL_TABLET | ORAL | Status: AC
  Filled 2013-10-04: qty 1

## 2013-10-04 MED ORDER — DIPHENHYDRAMINE HCL 50 MG/ML IJ SOLN
INTRAMUSCULAR | Status: AC
  Filled 2013-10-04: qty 1

## 2013-10-04 MED ORDER — METHYLPREDNISOLONE SODIUM SUCC 125 MG IJ SOLR: 125.0000 mg | Freq: Once | INTRAMUSCULAR | Status: DC

## 2013-10-04 NOTE — ED Notes (Signed)
Pt c/o multiple bee sting to right elbow and right hand onset 1030 this am Reports she was doing yard work and had gloves on Swelling, redness, tender, localized fever and numbness are some of the sx Denies SOB, dyspnea Alert w/no signs of acute distress.

## 2013-10-04 NOTE — Discharge Instructions (Signed)
You are experiencing an allergic reaction to the wasp or hornet sting.  This will require steroids and time to resolve You were given steroids, benadryl and pepcid tonight to help reverse the reaction Please take the steroids every morning Plesae use benadryl for the itching Please use an epipen in the future if needed

## 2013-10-04 NOTE — ED Provider Notes (Signed)
CSN: [id number]     Arrival date & time 10/04/13  1851 History   First MD Initiated Contact with Patient 10/04/13 1945     Chief Complaint  Patient presents with  . Insect Bite   (Consider location/radiation/quality/duration/timing/severity/associated sxs/prior Treatment) HPI  "bee sting" on R hand through glove and R elbow at 10:30. Slowly swelling and hand is now stiff. Continue to work. Ice w/ some benefit. Denies any CH, palpitations, SOB, dysphagia, difficulty breathing, tongue swelling. R arm now itching.    Past Medical History  Diagnosis Date  . Atrial fibrillation   . Hypertension   . Toxic multinodular goiter   . Hemorrhoids    History reviewed. No pertinent past surgical history. Family History  Problem Relation Age of Onset  . Other Mother   . Other Father    History  Substance Use Topics  . Smoking status: Former Smoker -- 1.00 packs/day    Quit date: 02/25/1983  . Smokeless tobacco: Not on file  . Alcohol Use: No   OB History   Grav Para Term Preterm Abortions TAB SAB Ect Mult Living                 Review of Systems Per HPI with all other pertinent systems negative.   Allergies  Bee venom  Home Medications   Prior to Admission medications   Medication Sig Start Date End Date Taking? Authorizing Provider  amLODipine (NORVASC) 2.5 MG tablet Take 1 tablet (2.5 mg total) by mouth daily. 09/07/13  Yes Lyn RecordsHenry W Smith III, MD  aspirin 81 MG tablet Take 81 mg by mouth daily.   Yes Historical Provider, MD  methimazole (TAPAZOLE) 10 MG tablet Take 15 mg by mouth 3 (three) times daily.   Yes Historical Provider, MD  valsartan-hydrochlorothiazide (DIOVAN-HCT) 320-25 MG per tablet Take 1 tablet by mouth daily. 09/07/13  Yes Lyn RecordsHenry W Smith III, MD  amiodarone (PACERONE) 200 MG tablet Take 0.5 tablets (100 mg total) by mouth daily. 09/02/13   Lyn RecordsHenry W Smith III, MD  COLLAGEN-VITAMIN C PO Take by mouth. Take 3 pills daily as diected    Historical Provider, MD   EPINEPHrine (EPIPEN) 0.3 mg/0.3 mL IJ SOAJ injection Inject 0.3 mLs (0.3 mg total) into the muscle once. 10/04/13   Ozella Rocksavid J Merrell, MD  Misc Natural Products (SM GLUCOSAMINE CHONDROITIN MSM PO) Take by mouth. Take at least 3 pills daily for knee joint problem    Historical Provider, MD  predniSONE (DELTASONE) 50 MG tablet Take daily with breakfast 10/04/13   Ozella Rocksavid J Merrell, MD   BP 152/72  Pulse 54  Temp(Src) 98 F (36.7 C) (Oral)  Resp 16  SpO2 95% Physical Exam  Constitutional: She is oriented to person, place, and time. She appears well-developed and well-nourished. No distress.  HENT:  Head: Normocephalic and atraumatic.  Eyes: EOM are normal. Pupils are equal, round, and reactive to light.  Neck: Normal range of motion.  Cardiovascular: Normal rate, normal heart sounds and intact distal pulses.   No murmur heard. Pulmonary/Chest: Effort normal and breath sounds normal. No respiratory distress. She has no wheezes. She has no rales. She exhibits no tenderness.  Airway patent   Musculoskeletal: Normal range of motion. She exhibits no edema.  Neurological: She is alert and oriented to person, place, and time. No cranial nerve deficit. Coordination normal.  Skin: She is not diaphoretic.  Diffuse swelling of the 2-4th knuckles of the R hand w/ erythema, and the elbow. Macular papular rash  of the R and L forearms.   Psychiatric: She has a normal mood and affect. Her behavior is normal. Judgment and thought content normal.    ED Course  Procedures (including critical care time) Labs Review Labs Reviewed - No data to display  Imaging Review No results found.   MDM   1. Allergic reaction, initial encounter   2. Wasp sting, accidental or unintentional, initial encounter    Systemic allergic reaction. Stable - solumedrol 125, benadryl, pepcid - start benadryl PRN at home - prednisone 50 daily x 5 days - epipen for emergency  Precautions given and all questions  answered  Shelly Flatten, MD Family Medicine 10/04/2013, 8:27 PM      Ozella Rocks, MD 10/04/13 2027

## 2014-03-26 ENCOUNTER — Other Ambulatory Visit: Payer: Self-pay | Admitting: Interventional Cardiology

## 2014-04-20 ENCOUNTER — Other Ambulatory Visit: Payer: Self-pay | Admitting: Internal Medicine

## 2014-04-20 DIAGNOSIS — E0580 Other thyrotoxicosis without thyrotoxic crisis or storm: Secondary | ICD-10-CM | POA: Diagnosis not present

## 2014-04-20 DIAGNOSIS — R221 Localized swelling, mass and lump, neck: Secondary | ICD-10-CM

## 2014-04-20 DIAGNOSIS — E0520 Thyrotoxicosis with toxic multinodular goiter without thyrotoxic crisis or storm: Secondary | ICD-10-CM | POA: Diagnosis not present

## 2014-04-28 ENCOUNTER — Ambulatory Visit
Admission: RE | Admit: 2014-04-28 | Discharge: 2014-04-28 | Disposition: A | Discharge status: Home / Self Care | Payer: Medicare Other | Source: Ambulatory Visit | Attending: Internal Medicine | Admitting: Internal Medicine

## 2014-04-28 DIAGNOSIS — R221 Localized swelling, mass and lump, neck: Secondary | ICD-10-CM | POA: Diagnosis not present

## 2014-05-24 ENCOUNTER — Other Ambulatory Visit: Payer: Self-pay | Admitting: Interventional Cardiology

## 2014-06-20 ENCOUNTER — Other Ambulatory Visit: Payer: Self-pay | Admitting: Interventional Cardiology

## 2014-07-07 ENCOUNTER — Other Ambulatory Visit: Payer: Self-pay | Admitting: Interventional Cardiology

## 2014-07-08 ENCOUNTER — Other Ambulatory Visit: Payer: Self-pay | Admitting: Interventional Cardiology

## 2014-07-30 ENCOUNTER — Other Ambulatory Visit: Payer: Self-pay | Admitting: Interventional Cardiology

## 2014-08-07 ENCOUNTER — Other Ambulatory Visit: Payer: Self-pay | Admitting: Interventional Cardiology

## 2014-08-18 DIAGNOSIS — Z23 Encounter for immunization: Secondary | ICD-10-CM | POA: Diagnosis not present

## 2014-08-18 DIAGNOSIS — E0580 Other thyrotoxicosis without thyrotoxic crisis or storm: Secondary | ICD-10-CM | POA: Diagnosis not present

## 2014-08-18 DIAGNOSIS — E0520 Thyrotoxicosis with toxic multinodular goiter without thyrotoxic crisis or storm: Secondary | ICD-10-CM | POA: Diagnosis not present

## 2014-08-20 ENCOUNTER — Other Ambulatory Visit: Payer: Self-pay | Admitting: Interventional Cardiology

## 2014-08-22 NOTE — Telephone Encounter (Signed)
Please advise on refill as patient has still failed to schedule an appointment and has been given multiple warnings. Thanks, MI

## 2014-08-25 ENCOUNTER — Telehealth: Payer: Self-pay | Admitting: *Deleted

## 2014-08-25 ENCOUNTER — Other Ambulatory Visit: Payer: Self-pay | Admitting: *Deleted

## 2014-08-25 MED ORDER — VALSARTAN-HYDROCHLOROTHIAZIDE 320-25 MG PO TABS: 1.0000 | ORAL_TABLET | Freq: Every day | ORAL | Status: DC

## 2014-08-25 MED ORDER — AMLODIPINE BESYLATE 2.5 MG PO TABS: 2.5000 mg | ORAL_TABLET | Freq: Every day | ORAL | Status: DC

## 2014-08-25 MED ORDER — AMIODARONE HCL 200 MG PO TABS: ORAL_TABLET | ORAL | Status: DC

## 2014-08-25 NOTE — Telephone Encounter (Signed)
Patient has had multiple warnings to schedule an appointment for refills. She has now scheduled one. Ok to extend refills until her appointment in August?

## 2014-08-25 NOTE — Telephone Encounter (Signed)
Yes it is ok

## 2014-10-07 ENCOUNTER — Ambulatory Visit: Payer: Medicare Other | Admitting: Physician Assistant

## 2014-10-09 NOTE — Progress Notes (Signed)
Cardiology Office Note   Date:  10/10/2014   ID:  Mary Chaney, DOB Aug 01, 1927, MRN [id number]  PCP:  No PCP Per Patient  Cardiologist:  Lesleigh Noe, MD   Chief Complaint  Patient presents with  . Atrial Fibrillation      History of Present Illness: Mary Chaney is a 81 y.o. female who presents for paroxysmal atrial fibrillation, chronic low-dose amiodarone therapy, hypertension.  Patient has a history of persistent atrial fibrillation that was treated with rhythm control greater than 5 years ago. She has been maintained on low-dose aspirin. She denies palpitations, orthopnea, and PND. Does no peripheral edema. She had one severe episode of chest pain that lasted less than 5 seconds occurring perhaps 6 months ago. This was not associated with palpitations.    Past Medical History  Diagnosis Date  . Atrial fibrillation   . Hypertension   . Toxic multinodular goiter   . Hemorrhoids     No past surgical history on file.   Current Outpatient Prescriptions  Medication Sig Dispense Refill  . amiodarone (PACERONE) 200 MG tablet TAKE 0.5 TABLETS (100 MG TOTAL) BY MOUTH DAILY. 15 tablet 1  . amLODipine (NORVASC) 2.5 MG tablet Take 1 tablet (2.5 mg total) by mouth daily. 30 tablet 1  . aspirin 81 MG tablet Take 81 mg by mouth daily.    Marland Kitchen EPINEPHrine (EPIPEN) 0.3 mg/0.3 mL IJ SOAJ injection Inject 0.3 mLs (0.3 mg total) into the muscle once. 2 Device 1  . methimazole (TAPAZOLE) 10 MG tablet Take 15 mg by mouth 3 (three) times daily.    . valsartan-hydrochlorothiazide (DIOVAN-HCT) 320-25 MG per tablet Take 1 tablet by mouth daily. 30 tablet 1   No current facility-administered medications for this visit.    Allergies:   Bee venom    Social History:  The patient  reports that she quit smoking about 31 years ago. She has never used smokeless tobacco. She reports that she does not drink alcohol or use illicit drugs.   Family History:  The patient's family history  includes Other in her father and mother.    ROS:  Please see the history of present illness.   Otherwise, review of systems are positive for frequent headaches for which she will take additional blood pressure medication because she feels it is due to her blood pressure. She has not recorded blood pressures to document elevation in blood pressure..   All other systems are reviewed and negative.    PHYSICAL EXAM: VS:  BP 132/68 mmHg  Pulse 59  Ht 4\' 11"  (1.499 m)  Wt 59.693 kg (131 lb 9.6 oz)  BMI 26.57 kg/m2 , BMI Body mass index is 26.57 kg/(m^2). GEN: Well nourished, well developed, in no acute distress HEENT: normal Neck: no JVD, carotid bruits, or masses Cardiac: RRR; no murmurs, rubs, or gallops,no edema  Respiratory:  clear to auscultation bilaterally, normal work of breathing GI: soft, nontender, nondistended, + BS MS: no deformity or atrophy Skin: warm and dry, no rash Neuro:  Strength and sensation are intact Psych: euthymic mood, full affect   EKG:  EKG is ordered today. The ekg ordered today demonstrates normal sinus rhythm with PACs   Recent Labs: No results found for requested labs within last 365 days.    Lipid Panel    Component Value Date/Time   CHOL  03/17/2008 0440    109        ATP III CLASSIFICATION:  <200  mg/dL   Desirable  161-096  mg/dL   Borderline High  >=045    mg/dL   High          TRIG 39 03/17/2008 0440   HDL 36* 03/17/2008 0440   CHOLHDL 3.0 03/17/2008 0440   VLDL 8 03/17/2008 0440   LDLCALC  03/17/2008 0440    65        Total Cholesterol/HDL:CHD Risk Coronary Heart Disease Risk Table                     Men   Women  1/2 Average Risk   3.4   3.3  Average Risk       5.0   4.4  2 X Average Risk   9.6   7.1  3 X Average Risk  23.4   11.0        Use the calculated Patient Ratio above and the CHD Risk Table to determine the patient's CHD Risk.        ATP III CLASSIFICATION (LDL):  <100     mg/dL   Optimal  409-811  mg/dL    Near or Above                    Optimal  130-159  mg/dL   Borderline  914-782  mg/dL   High  >956     mg/dL   Very High      Wt Readings from Last 3 Encounters:  10/10/14 59.693 kg (131 lb 9.6 oz)  07/12/13 63.504 kg (140 lb)  12/30/12 65.318 kg (144 lb)      Other studies Reviewed: Additional studies/ records that were reviewed today include: All laboratory data. Noted that TSH was mildly suppressed when done a year ago. Review of the above records demonstrates: None   ASSESSMENT AND PLAN:  1. Paroxysmal atrial fibrillation No clinical recurrence  2. H/O amiodarone therapy On low-dose amiodarone, 100 mg daily  3. Long term current use of anticoagulant therapy Taking aspirin daily.  4. Essential hypertension Controlled but I have advised that she get a blood pressure cuff and record pressures when she thinks her numbers are elevated. I have cautioned against taking medication because of headache.    Current medicines are reviewed at length with the patient today.  The patient has concerns regarding medicines.  The following changes have been made:  Vital long discussion concerning headache and blood pressure relationships. I educated her that there is no real relationship. She will purchase a blood pressure recording device and monitor her pressures. She will report [place] if her blood pressures run greater than 140/90 mmHg  Labs/ tests ordered today include:  No orders of the defined types were placed in this encounter.     Disposition:   FU with HS in 6 months  Signed, Lesleigh Noe, MD  10/10/2014 3:50 PM    [facility] [address] [place], [place], [place]  [address] Phone: [phone]; Fax: [phone]

## 2014-10-10 ENCOUNTER — Ambulatory Visit (INDEPENDENT_AMBULATORY_CARE_PROVIDER_SITE_OTHER): Payer: Medicare Other | Admitting: Interventional Cardiology

## 2014-10-10 ENCOUNTER — Encounter: Payer: Self-pay | Admitting: Interventional Cardiology

## 2014-10-10 VITALS — BP 132/68 | HR 59 | Ht 59.0 in | Wt 131.6 lb

## 2014-10-10 DIAGNOSIS — Z09 Encounter for follow-up examination after completed treatment for conditions other than malignant neoplasm: Secondary | ICD-10-CM

## 2014-10-10 DIAGNOSIS — Z7901 Long term (current) use of anticoagulants: Secondary | ICD-10-CM | POA: Diagnosis not present

## 2014-10-10 DIAGNOSIS — I480 Paroxysmal atrial fibrillation: Secondary | ICD-10-CM | POA: Diagnosis not present

## 2014-10-10 DIAGNOSIS — I10 Essential (primary) hypertension: Secondary | ICD-10-CM

## 2014-10-10 DIAGNOSIS — Z9229 Personal history of other drug therapy: Secondary | ICD-10-CM

## 2014-10-10 LAB — T4, FREE: Free T4: 1.01 ng/dL (ref 0.60–1.60)

## 2014-10-10 LAB — TSH: TSH: 2.56 u[IU]/mL (ref 0.35–4.50)

## 2014-10-10 NOTE — Patient Instructions (Addendum)
Medication Instructions:  Your physician recommends that you continue on your current medications as directed. Please refer to the Current Medication list given to you today.   Labwork: Tsh, T4, Lft today  Testing/Procedures: None   Follow-Up: Your physician wants you to follow-up in: 6 months with Dr.Smith You will receive a reminder letter in the mail two months in advance. If you don't receive a letter, please call our office to schedule the follow-up appointment.   Any Other Special Instructions Will Be Listed Below (If Applicable). Purchase a BP cuff. Monitor your blood pressure regularly. Call the office if your BP runs above 140/90 consistently

## 2014-10-11 LAB — HEPATIC FUNCTION PANEL
ALT: 14 U/L (ref 0–35)
AST: 18 U/L (ref 0–37)
Albumin: 3.7 g/dL (ref 3.5–5.2)
Alkaline Phosphatase: 78 U/L (ref 39–117)
BILIRUBIN DIRECT: 0 mg/dL (ref 0.0–0.3)
BILIRUBIN TOTAL: 0.3 mg/dL (ref 0.2–1.2)
Total Protein: 7.4 g/dL (ref 6.0–8.3)

## 2014-10-12 ENCOUNTER — Telehealth: Payer: Self-pay

## 2014-10-12 NOTE — Telephone Encounter (Signed)
Pt aware of lab results. Liver and thyroid are normal.  Pt verbalized understanding.    

## 2014-10-12 NOTE — Telephone Encounter (Signed)
-----   Message from Lyn Records, MD sent at 10/11/2014  6:04 PM EDT ----- Liver and thyroid are normal.

## 2014-10-19 ENCOUNTER — Other Ambulatory Visit: Payer: Self-pay | Admitting: Interventional Cardiology

## 2014-11-10 DIAGNOSIS — E0580 Other thyrotoxicosis without thyrotoxic crisis or storm: Secondary | ICD-10-CM | POA: Diagnosis not present

## 2015-03-01 DIAGNOSIS — E0580 Other thyrotoxicosis without thyrotoxic crisis or storm: Secondary | ICD-10-CM | POA: Diagnosis not present

## 2015-03-03 DIAGNOSIS — E0520 Thyrotoxicosis with toxic multinodular goiter without thyrotoxic crisis or storm: Secondary | ICD-10-CM | POA: Diagnosis not present

## 2015-03-03 DIAGNOSIS — Z13820 Encounter for screening for osteoporosis: Secondary | ICD-10-CM | POA: Diagnosis not present

## 2015-03-03 DIAGNOSIS — Z23 Encounter for immunization: Secondary | ICD-10-CM | POA: Diagnosis not present

## 2015-03-03 DIAGNOSIS — E0580 Other thyrotoxicosis without thyrotoxic crisis or storm: Secondary | ICD-10-CM | POA: Diagnosis not present

## 2015-04-06 DIAGNOSIS — Z780 Asymptomatic menopausal state: Secondary | ICD-10-CM | POA: Diagnosis not present

## 2015-04-06 DIAGNOSIS — Z13820 Encounter for screening for osteoporosis: Secondary | ICD-10-CM | POA: Diagnosis not present

## 2015-05-11 ENCOUNTER — Ambulatory Visit (INDEPENDENT_AMBULATORY_CARE_PROVIDER_SITE_OTHER): Payer: Medicare Other | Admitting: Interventional Cardiology

## 2015-05-11 ENCOUNTER — Ambulatory Visit
Admission: RE | Admit: 2015-05-11 | Discharge: 2015-05-11 | Disposition: A | Discharge status: Home / Self Care | Payer: Medicare Other | Source: Ambulatory Visit | Attending: Interventional Cardiology | Admitting: Interventional Cardiology

## 2015-05-11 ENCOUNTER — Encounter: Payer: Self-pay | Admitting: Interventional Cardiology

## 2015-05-11 VITALS — BP 136/70 | HR 59 | Ht 59.0 in | Wt 137.8 lb

## 2015-05-11 DIAGNOSIS — I10 Essential (primary) hypertension: Secondary | ICD-10-CM

## 2015-05-11 DIAGNOSIS — I480 Paroxysmal atrial fibrillation: Secondary | ICD-10-CM | POA: Diagnosis not present

## 2015-05-11 DIAGNOSIS — J9811 Atelectasis: Secondary | ICD-10-CM | POA: Diagnosis not present

## 2015-05-11 DIAGNOSIS — Z9229 Personal history of other drug therapy: Secondary | ICD-10-CM | POA: Diagnosis not present

## 2015-05-11 DIAGNOSIS — Z09 Encounter for follow-up examination after completed treatment for conditions other than malignant neoplasm: Secondary | ICD-10-CM

## 2015-05-11 DIAGNOSIS — Z7901 Long term (current) use of anticoagulants: Secondary | ICD-10-CM | POA: Diagnosis not present

## 2015-05-11 LAB — COMPREHENSIVE METABOLIC PANEL
ALK PHOS: 85 U/L (ref 33–130)
ALT: 12 U/L (ref 6–29)
AST: 17 U/L (ref 10–35)
Albumin: 3.6 g/dL (ref 3.6–5.1)
BUN: 17 mg/dL (ref 7–25)
CO2: 27 mmol/L (ref 20–31)
Calcium: 9.1 mg/dL (ref 8.6–10.4)
Chloride: 102 mmol/L (ref 98–110)
Creat: 0.7 mg/dL (ref 0.60–0.88)
Glucose, Bld: 100 mg/dL — ABNORMAL HIGH (ref 65–99)
POTASSIUM: 3.9 mmol/L (ref 3.5–5.3)
Sodium: 142 mmol/L (ref 135–146)
Total Bilirubin: 0.3 mg/dL (ref 0.2–1.2)
Total Protein: 6.8 g/dL (ref 6.1–8.1)

## 2015-05-11 LAB — TSH: TSH: 2.17 m[IU]/L

## 2015-05-11 NOTE — Patient Instructions (Signed)
Medication Instructions:  Your physician recommends that you continue on your current medications as directed. Please refer to the Current Medication list given to you today.   Labwork: Tsh, Cmet today  Testing/Procedures: A chest x-ray takes a picture of the organs and structures inside the chest, including the heart, lungs, and blood vessels. This test can show several things, including, whether the heart is enlarges; whether fluid is building up in the lungs; and whether pacemaker / defibrillator leads are still in place. ( [facility] [address] 1st floor)   Follow-Up: Your physician wants you to follow-up in: 6 months You will receive a reminder letter in the mail two months in advance. If you don't receive a letter, please call our office to schedule the follow-up appointment.   Any Other Special Instructions Will Be Listed Below (If Applicable).     If you need a refill on your cardiac medications before your next appointment, please call your pharmacy.

## 2015-05-11 NOTE — Progress Notes (Signed)
Cardiology Office Note   Date:  05/11/2015   ID:  SVARA TWYMAN, DOB 01/29/1928, MRN [id number]  PCP:  No PCP Per Patient  Cardiologist:  Sinclair Grooms, MD   Chief Complaint  Patient presents with  . Atrial Fibrillation      History of Present Illness: Mary Chaney is a 82 y.o. female who presents for Chronic diastolic heart failure, paroxysmal atrial fib, hypertension, and amiodarone therapy.  No complaints. [place]. Denies dyspnea. No episodes of syncope or PND. Appetite is stable. Overall she feels well.    Past Medical History  Diagnosis Date  . Atrial fibrillation ([place])   . Hypertension   . Toxic multinodular goiter   . Hemorrhoids     No past surgical history on file.   Current Outpatient Prescriptions  Medication Sig Dispense Refill  . amiodarone (PACERONE) 200 MG tablet Take 100 mg by mouth daily.    Marland Kitchen amLODipine (NORVASC) 2.5 MG tablet TAKE 1 TABLET (2.5 MG TOTAL) BY MOUTH DAILY. 90 tablet 3  . aspirin 81 MG tablet Take 81 mg by mouth daily.    Marland Kitchen EPINEPHrine (EPIPEN) 0.3 mg/0.3 mL IJ SOAJ injection Inject 0.3 mLs (0.3 mg total) into the muscle once. 2 Device 1  . methimazole (TAPAZOLE) 10 MG tablet Take 15 mg by mouth daily.     . valsartan-hydrochlorothiazide (DIOVAN-HCT) 320-25 MG per tablet TAKE 1 TABLET BY MOUTH DAILY. 90 tablet 3   No current facility-administered medications for this visit.    Allergies:   Bee venom    Social History:  The patient  reports that she quit smoking about 32 years ago. She has never used smokeless tobacco. She reports that she does not drink alcohol or use illicit drugs.   Family History:  The patient's family history includes Other in her father and mother.    ROS:  Please see the history of present illness.   Otherwise, review of systems are positive for Insomnia.   All other systems are reviewed and negative.    PHYSICAL EXAM: VS:  BP 136/70 mmHg  Pulse 59  Ht 4' 11"  (1.499 m)  Wt 137 lb  12.8 oz (62.506 kg)  BMI 27.82 kg/m2 , BMI Body mass index is 27.82 kg/(m^2). GEN: Well nourished, well developed, in no acute distress HEENT: normal Neck: no JVD, carotid bruits, or masses Cardiac: RRR.  There is no murmur, rub, or gallop. There is no edema. Respiratory:  clear to auscultation bilaterally, normal work of breathing. GI: soft, nontender, nondistended, + BS MS: no deformity or atrophy Skin: warm and dry, no rash Neuro:  Strength and sensation are intact Psych: euthymic mood, full affect   EKG:  EKG is not ordered today.   Recent Labs: 10/10/2014: ALT 14; TSH 2.56    Lipid Panel    Component Value Date/Time   CHOL  03/17/2008 0440    109        ATP III CLASSIFICATION:  <200     mg/dL   Desirable  200-239  mg/dL   Borderline High  >=240    mg/dL   High          TRIG 39 03/17/2008 0440   HDL 36* 03/17/2008 0440   CHOLHDL 3.0 03/17/2008 0440   VLDL 8 03/17/2008 0440   LDLCALC  03/17/2008 0440    65        Total Cholesterol/HDL:CHD Risk Coronary Heart Disease Risk Table  Men   Women  1/2 Average Risk   3.4   3.3  Average Risk       5.0   4.4  2 X Average Risk   9.6   7.1  3 X Average Risk  23.4   11.0        Use the calculated Patient Ratio above and the CHD Risk Table to determine the patient's CHD Risk.        ATP III CLASSIFICATION (LDL):  <100     mg/dL   Optimal  100-129  mg/dL   Near or Above                    Optimal  130-159  mg/dL   Borderline  160-189  mg/dL   High  >190     mg/dL   Very High      Wt Readings from Last 3 Encounters:  05/11/15 137 lb 12.8 oz (62.506 kg)  10/10/14 131 lb 9.6 oz (59.693 kg)  07/12/13 140 lb (63.504 kg)      Other studies Reviewed: Additional studies/ records that were reviewed today include: Reviewed most recent labs.. The findings include none of been performed recently..    ASSESSMENT AND PLAN:  1. Paroxysmal atrial fibrillation (HCC) No clinical recurrence  2. H/O  amiodarone therapy Tolerating low-dose amiodarone without complications - TSH - Comp Met (CMET) - DG Chest 2 View; Future  3. Essential hypertension Well controlled  4. Long term current use of anticoagulant therapy No bleeding on aspirin therapy    Current medicines are reviewed at length with the patient today.  The patient has the following concerns regarding medicines: none.  The following changes/actions have been instituted:    Encouraged her to remain active.  Labs/ tests ordered today include:  Orders Placed This Encounter  Procedures  . DG Chest 2 View  . TSH  . Comp Met (CMET)     Disposition:   FU with HS in 6 months  Signed, Sinclair Grooms, MD  05/11/2015 2:31 PM    [place], [place], [place]  [address] Phone: [phone]; Fax: [phone]

## 2015-05-15 ENCOUNTER — Telehealth: Payer: Self-pay

## 2015-05-15 DIAGNOSIS — I4891 Unspecified atrial fibrillation: Secondary | ICD-10-CM

## 2015-05-15 NOTE — Telephone Encounter (Signed)
Informed patient of results and verbal understanding expressed.  Repeat lab work (CMET, TSH) ordered to be scheduled in 6 months. Patient agrees with treatment plan.

## 2015-05-15 NOTE — Telephone Encounter (Signed)
-----   Message from Lyn RecordsHenry W Smith, MD sent at 05/13/2015 11:04 AM EDT ----- Normal. Recheck in 6 months

## 2015-06-01 DIAGNOSIS — E0580 Other thyrotoxicosis without thyrotoxic crisis or storm: Secondary | ICD-10-CM | POA: Diagnosis not present

## 2015-06-01 DIAGNOSIS — E0520 Thyrotoxicosis with toxic multinodular goiter without thyrotoxic crisis or storm: Secondary | ICD-10-CM | POA: Diagnosis not present

## 2015-09-01 DIAGNOSIS — E0580 Other thyrotoxicosis without thyrotoxic crisis or storm: Secondary | ICD-10-CM | POA: Diagnosis not present

## 2015-09-01 DIAGNOSIS — E0520 Thyrotoxicosis with toxic multinodular goiter without thyrotoxic crisis or storm: Secondary | ICD-10-CM | POA: Diagnosis not present

## 2015-10-06 ENCOUNTER — Other Ambulatory Visit: Payer: Self-pay | Admitting: Interventional Cardiology

## 2015-11-08 DIAGNOSIS — Z23 Encounter for immunization: Secondary | ICD-10-CM | POA: Diagnosis not present

## 2015-11-26 ENCOUNTER — Encounter (HOSPITAL_COMMUNITY): Payer: Self-pay | Admitting: Emergency Medicine

## 2015-11-26 ENCOUNTER — Emergency Department (HOSPITAL_BASED_OUTPATIENT_CLINIC_OR_DEPARTMENT_OTHER)
Admit: 2015-11-26 | Discharge: 2015-11-26 | Disposition: A | Discharge status: Home / Self Care | Payer: Medicare Other | Attending: Emergency Medicine | Admitting: Emergency Medicine

## 2015-11-26 ENCOUNTER — Encounter (HOSPITAL_COMMUNITY): Payer: Self-pay | Admitting: *Deleted

## 2015-11-26 ENCOUNTER — Emergency Department (HOSPITAL_COMMUNITY)
Admission: EM | Admit: 2015-11-26 | Discharge: 2015-11-26 | Disposition: A | Discharge status: Home / Self Care | Payer: Medicare Other | Attending: Emergency Medicine | Admitting: Emergency Medicine

## 2015-11-26 ENCOUNTER — Emergency Department (HOSPITAL_COMMUNITY): Payer: Medicare Other

## 2015-11-26 ENCOUNTER — Ambulatory Visit (HOSPITAL_COMMUNITY)
Admission: EM | Admit: 2015-11-26 | Discharge: 2015-11-26 | Disposition: A | Discharge status: Nursing Home (Includes ICF) | Payer: Medicare Other

## 2015-11-26 DIAGNOSIS — R079 Chest pain, unspecified: Secondary | ICD-10-CM | POA: Diagnosis not present

## 2015-11-26 DIAGNOSIS — Z79899 Other long term (current) drug therapy: Secondary | ICD-10-CM | POA: Diagnosis not present

## 2015-11-26 DIAGNOSIS — R42 Dizziness and giddiness: Secondary | ICD-10-CM | POA: Insufficient documentation

## 2015-11-26 DIAGNOSIS — E860 Dehydration: Secondary | ICD-10-CM | POA: Diagnosis not present

## 2015-11-26 DIAGNOSIS — I10 Essential (primary) hypertension: Secondary | ICD-10-CM | POA: Diagnosis not present

## 2015-11-26 DIAGNOSIS — R51 Headache: Secondary | ICD-10-CM | POA: Diagnosis not present

## 2015-11-26 DIAGNOSIS — R0602 Shortness of breath: Secondary | ICD-10-CM | POA: Diagnosis not present

## 2015-11-26 DIAGNOSIS — Z87891 Personal history of nicotine dependence: Secondary | ICD-10-CM | POA: Diagnosis not present

## 2015-11-26 DIAGNOSIS — Z7982 Long term (current) use of aspirin: Secondary | ICD-10-CM | POA: Insufficient documentation

## 2015-11-26 DIAGNOSIS — M79609 Pain in unspecified limb: Secondary | ICD-10-CM

## 2015-11-26 LAB — BRAIN NATRIURETIC PEPTIDE: B Natriuretic Peptide: 43.6 pg/mL (ref 0.0–100.0)

## 2015-11-26 LAB — CBC WITH DIFFERENTIAL/PLATELET
BASOS ABS: 0 10*3/uL (ref 0.0–0.1)
BASOS PCT: 0 %
EOS PCT: 3 %
Eosinophils Absolute: 0.1 10*3/uL (ref 0.0–0.7)
HEMATOCRIT: 43.5 % (ref 36.0–46.0)
Hemoglobin: 14.1 g/dL (ref 12.0–15.0)
LYMPHS PCT: 35 %
Lymphs Abs: 1.8 10*3/uL (ref 0.7–4.0)
MCH: 29.5 pg (ref 26.0–34.0)
MCHC: 32.4 g/dL (ref 30.0–36.0)
MCV: 91 fL (ref 78.0–100.0)
Monocytes Absolute: 0.3 10*3/uL (ref 0.1–1.0)
Monocytes Relative: 6 %
NEUTROS ABS: 2.8 10*3/uL (ref 1.7–7.7)
Neutrophils Relative %: 56 %
PLATELETS: 225 10*3/uL (ref 150–400)
RBC: 4.78 MIL/uL (ref 3.87–5.11)
RDW: 14.3 % (ref 11.5–15.5)
WBC: 5 10*3/uL (ref 4.0–10.5)

## 2015-11-26 LAB — COMPREHENSIVE METABOLIC PANEL
ALBUMIN: 4 g/dL (ref 3.5–5.0)
ALK PHOS: 85 U/L (ref 38–126)
ALT: 17 U/L (ref 14–54)
ANION GAP: 8 (ref 5–15)
AST: 25 U/L (ref 15–41)
BILIRUBIN TOTAL: 0.5 mg/dL (ref 0.3–1.2)
BUN: 21 mg/dL — AB (ref 6–20)
CALCIUM: 9.6 mg/dL (ref 8.9–10.3)
CO2: 27 mmol/L (ref 22–32)
CREATININE: 0.84 mg/dL (ref 0.44–1.00)
Chloride: 101 mmol/L (ref 101–111)
GFR calc Af Amer: >60 mL/min (ref >60)
GFR calc non Af Amer: >60 mL/min (ref >60)
GLUCOSE: 94 mg/dL (ref 65–99)
Potassium: 3.8 mmol/L (ref 3.5–5.1)
Sodium: 136 mmol/L (ref 135–145)
TOTAL PROTEIN: 8 g/dL (ref 6.5–8.1)

## 2015-11-26 LAB — D-DIMER, QUANTITATIVE (NOT AT ARMC): D DIMER QUANT: 1.24 ug{FEU}/mL — AB (ref 0.00–0.50)

## 2015-11-26 LAB — URINALYSIS, ROUTINE W REFLEX MICROSCOPIC
BILIRUBIN URINE: NEGATIVE
Glucose, UA: NEGATIVE mg/dL
HGB URINE DIPSTICK: NEGATIVE
Ketones, ur: NEGATIVE mg/dL
Leukocytes, UA: NEGATIVE
Nitrite: NEGATIVE
PH: 6 (ref 5.0–8.0)
Protein, ur: NEGATIVE mg/dL
SPECIFIC GRAVITY, URINE: 1.01 (ref 1.005–1.030)

## 2015-11-26 LAB — I-STAT CG4 LACTIC ACID, ED
LACTIC ACID, VENOUS: 1.57 mmol/L (ref 0.5–1.9)
Lactic Acid, Venous: 1.34 mmol/L (ref 0.5–1.9)

## 2015-11-26 LAB — I-STAT TROPONIN, ED
TROPONIN I, POC: 0.01 ng/mL (ref 0.00–0.08)
Troponin i, poc: 0 ng/mL (ref 0.00–0.08)
Troponin i, poc: 0.01 ng/mL (ref 0.00–0.08)

## 2015-11-26 LAB — MAGNESIUM: Magnesium: 2 mg/dL (ref 1.7–2.4)

## 2015-11-26 LAB — TSH: TSH: 2.876 u[IU]/mL (ref 0.350–4.500)

## 2015-11-26 MED ORDER — SODIUM CHLORIDE 0.9 % IV BOLUS (SEPSIS)
1000.0000 mL | Freq: Once | INTRAVENOUS | Status: AC
  Administered 2015-11-26: 1000 mL via INTRAVENOUS

## 2015-11-26 MED ORDER — IOPAMIDOL (ISOVUE-370) INJECTION 76%
INTRAVENOUS | Status: AC
  Administered 2015-11-26: 100 mL
  Filled 2015-11-26: qty 100

## 2015-11-26 NOTE — ED Triage Notes (Signed)
Pt sent here from urgent care with c/o dizziness for 2 weeks, worse today-- took extra losartan this am, states "That normally helps with the dizziness, but it didn't today" pt has hx of AFib,

## 2015-11-26 NOTE — ED Provider Notes (Signed)
CSN: [id number]     Arrival date & time 11/26/15  1352 History   None    Chief Complaint  Patient presents with  . Headache  . Dizziness  . Leg Swelling   (Consider location/radiation/quality/duration/timing/severity/associated sxs/prior Treatment) 82 y.o. female presents with dizzness, headache and left foot swelling intermittently X 2 weeks. Patient reports changes in vision this morning that has improved but has not self resolved with her second BP medication and fish Condition is acute on chronic in nature. Condition is made better by nothing. Condition is made worse by nothing. Patient denies any relief from hypertension medication prior to there arrival at this facility. Patient denies any additional neurological deficits        Past Medical History:  Diagnosis Date  . Atrial fibrillation (HCC)   . Hemorrhoids   . Hypertension   . Toxic multinodular goiter    History reviewed. No pertinent surgical history. Family History  Problem Relation Age of Onset  . Other Mother   . Other Father    Social History  Substance Use Topics  . Smoking status: Former Smoker    Packs/day: 1.00    Quit date: 02/25/1983  . Smokeless tobacco: Never Used  . Alcohol use No   OB History    No data available     Review of Systems  Allergies  Bee venom  Home Medications   Prior to Admission medications   Medication Sig Start Date End Date Taking? Authorizing Provider  amiodarone (PACERONE) 200 MG tablet Take 0.5 tablets (100 mg total) by mouth daily. 10/06/15  Yes Lyn RecordsHenry W Smith, MD  amLODipine (NORVASC) 2.5 MG tablet TAKE 1 TABLET (2.5 MG TOTAL) BY MOUTH DAILY. 10/06/15  Yes Lyn RecordsHenry W Smith, MD  aspirin 81 MG tablet Take 81 mg by mouth daily.   Yes Historical Provider, MD  methimazole (TAPAZOLE) 10 MG tablet Take 15 mg by mouth daily.    Yes Historical Provider, MD  valsartan-hydrochlorothiazide (DIOVAN-HCT) 320-25 MG tablet TAKE 1 TABLET BY MOUTH DAILY. 10/06/15  Yes Lyn RecordsHenry W Smith, MD   amiodarone (PACERONE) 200 MG tablet Take 100 mg by mouth daily.    Historical Provider, MD  EPINEPHrine (EPIPEN) 0.3 mg/0.3 mL IJ SOAJ injection Inject 0.3 mLs (0.3 mg total) into the muscle once. 10/04/13   Ozella Rocksavid J Merrell, MD   Meds Ordered and Administered this Visit  Medications - No data to display  BP 140/59 (BP Location: Right Arm)   Pulse (!) 58   Temp 97.8 F (36.6 C) (Oral)   Resp 20   Ht 4\' 11"  (1.499 m)   Wt 134 lb (60.8 kg)   SpO2 99%   BMI 27.06 kg/m  No data found.   Physical Exam  Urgent Care Course   Clinical Course    Procedures (including critical care time)  Labs Review Labs Reviewed - No data to display  Imaging Review No results found.   Visual Acuity Review  Right Eye Distance:   Left Eye Distance:   Bilateral Distance:    Right Eye Near:   Left Eye Near:    Bilateral Near:         MDM   1. Dizziness        Alene MiresJennifer C Roberta Angell, NP 11/26/15 1524

## 2015-11-26 NOTE — ED Notes (Signed)
Patient transported to CT 

## 2015-11-26 NOTE — ED Notes (Signed)
Patient returned from CT

## 2015-11-26 NOTE — ED Provider Notes (Signed)
[facility]Y DEPT Provider Note   CSN: [id number] Arrival date & time: 11/26/15  1539     History   Chief Complaint Chief Complaint  Patient presents with  . Dizziness    HPI Mary Chaney is a 82 y.o. female With a past medical history significant for hypertension, atrial fibrillation, Prior episodes of dehydration who presents with lightheadedness, mild dizziness, chronic blurry vision, and a brief second of sharp chest pain. Patient denies fevers, chills, shortness of breath, nausea, vomiting,Constipation, diarrhea, dysuria. Patient says that she has had some mild left ankle pain and swelling recently. No history of DVT or PE. No numbness, tingling, or weakness of a extremity. Patient reports her symptoms have gone for the last two weeks and seem to worsen after she takes her blood pressure medicine. Patient reports feeling similar when she was dehydrated in the past.   The history is provided by the patient, a relative and medical records. No language interpreter was used.  Dizziness  Quality:  Lightheadedness and imbalance Severity:  Moderate Onset quality:  Gradual Duration:  2 weeks Timing:  Intermittent Progression:  Waxing and waning Chronicity:  Recurrent Context: medication and standing up   Context: not with loss of consciousness and not with physical activity   Relieved by:  Lying down and fluids Worsened by:  Standing up Ineffective treatments:  None tried Associated symptoms: chest pain and vision changes (chronic blurry viision)   Associated symptoms: no diarrhea, no headaches, no nausea, no palpitations, no shortness of breath, no syncope, no vomiting and no weakness   Chest pain:    Quality: sharp     Severity:  Mild   Onset quality:  Sudden   Duration: seconds.   Timing:  Rare   Progression:  Resolved   Chronicity:  New Risk factors: no new medications     Past Medical History:  Diagnosis Date  . Atrial fibrillation (HCC)   . Hemorrhoids   .  Hypertension   . Toxic multinodular goiter     Patient Active Problem List   Diagnosis Date Noted  . H/O amiodarone therapy 07/12/2013  . Long term current use of anticoagulant therapy 12/30/2012    Class: Chronic  . Atrial fibrillation (HCC)   . Hypertension   . Toxic multinodular goiter   . Hemorrhoids     History reviewed. No pertinent surgical history.  OB History    No data available       Home Medications    Prior to Admission medications   Medication Sig Start Date End Date Taking? Authorizing Provider  amiodarone (PACERONE) 200 MG tablet Take 100 mg by mouth daily.    Historical Provider, MD  amiodarone (PACERONE) 200 MG tablet Take 0.5 tablets (100 mg total) by mouth daily. 10/06/15   Lyn Records, MD  amLODipine (NORVASC) 2.5 MG tablet TAKE 1 TABLET (2.5 MG TOTAL) BY MOUTH DAILY. 10/06/15   Lyn Records, MD  aspirin 81 MG tablet Take 81 mg by mouth daily.    Historical Provider, MD  EPINEPHrine (EPIPEN) 0.3 mg/0.3 mL IJ SOAJ injection Inject 0.3 mLs (0.3 mg total) into the muscle once. 10/04/13   Ozella Rocks, MD  methimazole (TAPAZOLE) 10 MG tablet Take 15 mg by mouth daily.     Historical Provider, MD  valsartan-hydrochlorothiazide (DIOVAN-HCT) 320-25 MG tablet TAKE 1 TABLET BY MOUTH DAILY. 10/06/15   Lyn Records, MD    Family History Family History  Problem Relation Age of Onset  . Other  Mother   . Other Father     Social History Social History  Substance Use Topics  . Smoking status: Former Smoker    Packs/day: 1.00    Quit date: 02/25/1983  . Smokeless tobacco: Never Used  . Alcohol use No     Allergies   Bee venom   Review of Systems Review of Systems  Constitutional: Negative for chills, diaphoresis, fatigue and fever.  Respiratory: Negative for cough, chest tightness, shortness of breath, wheezing and stridor.   Cardiovascular: Positive for chest pain and leg swelling (ankle welling). Negative for palpitations and syncope.    Gastrointestinal: Negative for abdominal pain, constipation, diarrhea, nausea and vomiting.  Genitourinary: Negative for dysuria and flank pain.  Musculoskeletal: Negative for back pain, gait problem, neck pain and neck stiffness.  Skin: Negative for rash and wound.  Neurological: Positive for dizziness and light-headedness. Negative for tremors, seizures, speech difficulty, weakness, numbness and headaches.  Psychiatric/Behavioral: Negative for agitation.  All other systems reviewed and are negative.    Physical Exam Updated Vital Signs BP 143/71 (BP Location: Right Arm)   Pulse (!) 55   Temp 97.8 F (36.6 C) (Oral)   Resp 20   Ht 4\' 11"  (1.499 m)   Wt 134 lb (60.8 kg)   SpO2 97%   BMI 27.06 kg/m   Physical Exam  Constitutional: She is oriented to person, place, and time. She appears well-developed and well-nourished. No distress.  HENT:  Head: Normocephalic and atraumatic.  Right Ear: External ear normal.  Left Ear: External ear normal.  Mouth/Throat: Oropharynx is clear and moist. No oropharyngeal exudate.  Eyes: Conjunctivae and EOM are normal. Pupils are equal, round, and reactive to light.  Neck: Normal range of motion. Neck supple.  Cardiovascular: Normal rate and regular rhythm.   No murmur heard. Pulmonary/Chest: Effort normal and breath sounds normal. No stridor. No respiratory distress. She has no rales. She exhibits no tenderness.  Abdominal: Soft. There is no tenderness. There is no guarding.  Musculoskeletal: She exhibits no edema.  Neurological: She is alert and oriented to person, place, and time. She has normal reflexes. She is not disoriented. She displays no tremor and normal reflexes. No cranial nerve deficit or sensory deficit. She exhibits normal muscle tone. Coordination and gait normal. GCS eye subscore is 4. GCS verbal subscore is 5. GCS motor subscore is 6.  Skin: Skin is warm and dry. Capillary refill takes less than 2 seconds. She is not  diaphoretic. No erythema.  Psychiatric: She has a normal mood and affect.  Nursing note and vitals reviewed.    ED Treatments / Results  Labs (all labs ordered are listed, but only abnormal results are displayed) Labs Reviewed  COMPREHENSIVE METABOLIC PANEL - Abnormal; Notable for the following:       Result Value   BUN 21 (*)    All other components within normal limits  URINALYSIS, ROUTINE W REFLEX MICROSCOPIC (NOT AT [facility]) - Abnormal; Notable for the following:    APPearance HAZY (*)    All other components within normal limits  D-DIMER, QUANTITATIVE (NOT AT [facility]) - Abnormal; Notable for the following:    D-Dimer, Quant 1.24 (*)    All other components within normal limits  CBC WITH DIFFERENTIAL/PLATELET  MAGNESIUM  BRAIN NATRIURETIC PEPTIDE  TSH  I-STAT CG4 LACTIC ACID, ED  I-STAT TROPOININ, ED  I-STAT CG4 LACTIC ACID, ED  I-STAT TROPOININ, ED  I-STAT TROPOININ, ED    EKG  EKG Interpretation  Date/Time:  Sunday November 26 2015 15:48:46 EDT Ventricular Rate:  55 PR Interval:  166 QRS Duration: 72 QT Interval:  478 QTC Calculation: 457 R Axis:   -30 Text Interpretation:  Sinus bradycardia with Premature atrial complexes Left axis deviation Cannot rule out Anterior infarct , age undetermined Abnormal ECG Confirmed by Rush LandmarkEGELER MD, Venissa Nappi [phone]([id number]) on 11/26/2015 4:07:22 PM       Radiology Dg Chest 2 View  Result Date: 11/26/2015 CLINICAL DATA:  Left foot and ankle swelling. Dizziness. Hypertension. EXAM: CHEST  2 VIEW COMPARISON:  05/11/2015 and 07/12/2013 radiographs. Chest CT 03/18/2008. FINDINGS: Stable cardiomegaly and aortic atherosclerosis. There is stable tracheal deviation to the right, corresponding with substernal extension of thyroid goiter. There is no edema, confluent airspace opacity or pleural effusion. Several EKG snaps overlie the chest and upper abdomen. In addition to this, there is an 11 mm nodular density overlapping the posterior aspect of the  left seventh rib on the frontal examination, not clearly an EKG snap. This is suspicious for a pulmonary nodule. It was not present on the 2010 CT. It is questionably present on interval radiographs. IMPRESSION: 1. No acute chest findings. 2. Stable substernal extension of left thyroid goiter. 3. Possible left pulmonary nodule. Based on conspicuity, this may reflect a granuloma. Chest CT recommended for further evaluation. If done solely for this, no contrast necessary. Electronically Signed   By: Carey BullocksWilliam  Veazey M.D.   On: 11/26/2015 17:24   Ct Head Wo Contrast  Result Date: 11/26/2015 CLINICAL DATA:  Headache.  Dizziness. EXAM: CT HEAD WITHOUT CONTRAST TECHNIQUE: Contiguous axial images were obtained from the base of the skull through the vertex without intravenous contrast. COMPARISON:  None. FINDINGS: Brain: Normal appearance of the brain for age. No evidence of acute infarction, hemorrhage, hydrocephalus, extra-axial collection or mass lesion/mass effect. Vascular: No non artifactual hyperdense vessel or unexpected calcification. Skull: No acute or aggressive finding Sinuses/Orbits: Negative Other: Negative IMPRESSION: Normal for age head CT. Electronically Signed   By: Marnee SpringJonathon  Watts M.D.   On: 11/26/2015 17:03   Ct Angio Chest Pe W And/or Wo Contrast  Result Date: 11/26/2015 CLINICAL DATA:  Dizziness for 2 weeks, worse today. Weakness, shortness of breath, elevated D-dimer. EXAM: CT ANGIOGRAPHY CHEST WITH CONTRAST TECHNIQUE: Multidetector CT imaging of the chest was performed using the standard protocol during bolus administration of intravenous contrast. Multiplanar CT image reconstructions and MIPs were obtained to evaluate the vascular anatomy. CONTRAST:  100 mL Isovue 370 COMPARISON:  03/16/2008 FINDINGS: Cardiovascular: Technically adequate study with good opacification of the central and segmental pulmonary arteries. No focal filling defects. No evidence of significant pulmonary embolus.  Diffuse cardiac enlargement. Calcification in the aortic valve and mitral valve. Calcification of the aorta. No aortic aneurysm or dissection. Coronary artery calcifications. Mediastinum/Nodes: Massive enlargement of the thyroid gland with large substernal extension. Substernal thyroid mass measures about 6.1 cm diameter. This is consistent with thyroid goiter and is enlarged since the previous study. The thyroid goiter displaces the trachea towards the right with mild narrowing of the trachea. The esophagus is also displaced towards the right. Esophagus is decompressed. No significant lymphadenopathy in the chest. Lungs/Pleura: Evaluation of the lungs is limited due to respiratory motion. Atelectasis in the lung bases. 11 mm solid nodule demonstrated in the superior segment of the left lower lung. This is new since previous study. Malignancy should be excluded. No other discrete pulmonary nodules identified. No pleural effusions. No pneumothorax. Airways are patent. Upper Abdomen: Limited visualization. No significant abnormality  demonstrated. Musculoskeletal: Degenerative changes throughout the spine. No destructive bone lesions. Review of the MIP images confirms the above findings. IMPRESSION: No evidence of significant pulmonary embolus. Very large thyroid goiter with substernal extension, displacing the trachea and esophagus towards the right. This is enlarging since previous study. Diffuse cardiac enlargement. **An incidental finding of potential clinical significance has been found. 11 mm solid noncalcified nodule demonstrated in the superior segment left lower lung, corresponding to lesion seen on chest radiograph. This is indeterminate. Consider one of the following in 3 months for both low-risk and high-risk individuals: (a) repeat chest CT, (b) follow-up PET-CT, or (c) tissue sampling. This recommendation follows the consensus statement: Guidelines for Management of Incidental Pulmonary Nodules Detected  on CT Images: From the Fleischner Society 2017; Radiology 2017; 284:228-243. ** Electronically Signed   By: Burman Nieves M.D.   On: 11/26/2015 21:18    Procedures Procedures (including critical care time)  Medications Ordered in ED Medications  sodium chloride 0.9 % bolus 1,000 mL (0 mLs Intravenous Stopped 11/26/15 1954)  sodium chloride 0.9 % bolus 1,000 mL (0 mLs Intravenous Stopped 11/26/15 2100)  iopamidol (ISOVUE-370) 76 % injection (100 mLs  Contrast Given 11/26/15 2026)     Initial Impression / Assessment and Plan / ED Course  I have reviewed the triage vital signs and the nursing notes.  Pertinent labs & imaging results that were available during my care of the patient were reviewed by me and considered in my medical decision making (see chart for details).  Clinical Course    Mary Chaney is a 82 y.o. female With a past medical history significant for hypertension, atrial fibrillation, Prior episodes of dehydration who presents with lightheadedness, mild dizziness, chronic blurry vision, and a brief second of sharp chest pain. History and exam are seen above.   Given patients lightheadednessAnd report of chest pain, workup initiated to look for occult infection, PE, dehydration, or cardiac etiology of symptoms. Given the report of associated dizziness, CT had ordered to look for intracranial abnormality.  EKG showed sinus bradycardia however, patient's heart rate remained in normal range for most of encounter. Patient was not symptomatic during her encounter.  Lab testing showed normal lactic acid, negative troponin, Nonelevated BNP, normal magnesium, normal TSH, Unremarkable CMP, and unremarkable CBC. Urinalysis did not show evidence of infection.  CT of the head showed no acute intracranial abnormality.  Chest x-ray showed no evidence of pneumonia but did show a nodule that needed further evaluation.  The dimer was positive, prompting CTA to look for PE as well as  further investigate nodule.  On further exam, patient did have mild swelling of left ankle. Given elevated D dimer, DVT study ordered.  DVT study negative for DVT but there was evidence of echogenicity and left posterior lower calf area. While patient was laying in exam room, swelling resolved and patient's pain also resolved an ankle. Feel this is likely dependent edema secondary to patient sitting in chair for a long period of time today. Do not feel patient had deep infection and ankle even reassuring lab testing, resolution of swelling with lifting leg, and improvement in exam. Patient able to move ankle and walk without difficulty, do not feel muscle tear or injury was present.  PT study showed no evidence of pulmonary Embolism. Patient was found to have been large goiter that is larger than prior extending into chest. Patient knows she has large goiter and TSH was normal. Patient formed of finding and will follow  up with PCP. Doubt thoracic outlet syndrome at this time. Pulmonary nodule was again demonstrated and patient will follow up with PCP for further workup.  Patient was ambulated after workup and had no lightheadedness, coordination problems and a completely normal neurologic exam.  After reassuring,Feel patient symptoms likely secondary to dehydration as patient then reported minimal fluid intake over period of time with her symptoms. Do not feel there significant infection, stroke, arrhythmia, or other abnormality requiring admission. Patient felt much better after fluids. Patient and family agreed with plan for discharge outpatient management of symptoms. Patient had no other questions or concerns and return precautions were given. Patient discharged in good condition    Final Clinical Impressions(s) / ED Diagnoses   Final diagnoses:  Dehydration  Lightheadedness    New Prescriptions Discharge Medication List as of 11/26/2015 11:23 PM      Clinical Impression: 1. Dehydration    2. Lightheadedness     Disposition: Discharge  Condition: Good  I have discussed the results, Dx and Tx plan with the pt(& family if present). He/she/they expressed understanding and agree(s) with the plan. Discharge instructions discussed at great length. Strict return precautions discussed and pt &/or family have verbalized understanding of the instructions. No further questions at time of discharge.    Discharge Medication List as of 11/26/2015 11:23 PM      Follow Up: Lyn Records, MD [address] [place] [address] [phone]  Schedule an appointment as soon as possible for a visit  If symptoms worsen, please return to the nearest ED.     Canary Brim Lattie Riege, MD 11/27/15 [phone]

## 2015-11-26 NOTE — Discharge Instructions (Signed)
ED for follow up

## 2015-11-26 NOTE — ED Notes (Signed)
Patient returned from Radiology; assisted to bathroom.

## 2015-11-26 NOTE — ED Triage Notes (Signed)
Reports left foot and ankle swelling x 2 wks.  Took her normal dose of Valsartan last night before bed; woke up @ 0530 with HA - felt her BP was high, so took another Valsartan.  After returning to sleep for a short period of time, HA improved.  After eating lunch, HA returned and felt dizzy.  Has since improved and states feeling better now.  Denies any dizziness at present.

## 2015-11-26 NOTE — ED Notes (Signed)
Report called to Clydie BraunKaren, ED Triage RN.

## 2016-02-02 ENCOUNTER — Encounter (INDEPENDENT_AMBULATORY_CARE_PROVIDER_SITE_OTHER): Payer: Self-pay

## 2016-02-02 ENCOUNTER — Encounter: Payer: Self-pay | Admitting: Interventional Cardiology

## 2016-02-02 ENCOUNTER — Ambulatory Visit (INDEPENDENT_AMBULATORY_CARE_PROVIDER_SITE_OTHER): Payer: Medicare Other | Admitting: Interventional Cardiology

## 2016-02-02 VITALS — BP 124/72 | HR 51 | Ht 59.0 in | Wt 136.2 lb

## 2016-02-02 DIAGNOSIS — Z9229 Personal history of other drug therapy: Secondary | ICD-10-CM | POA: Diagnosis not present

## 2016-02-02 DIAGNOSIS — I10 Essential (primary) hypertension: Secondary | ICD-10-CM | POA: Diagnosis not present

## 2016-02-02 DIAGNOSIS — Z7901 Long term (current) use of anticoagulants: Secondary | ICD-10-CM | POA: Diagnosis not present

## 2016-02-02 DIAGNOSIS — I4891 Unspecified atrial fibrillation: Secondary | ICD-10-CM | POA: Diagnosis not present

## 2016-02-02 DIAGNOSIS — I2510 Atherosclerotic heart disease of native coronary artery without angina pectoris: Secondary | ICD-10-CM

## 2016-02-02 NOTE — Patient Instructions (Signed)
Medication Instructions:  None  Labwork: None  Testing/Procedures: None  Follow-Up: Your physician wants you to follow-up in: 6 months with Dr. Katrinka BlazingSmith with TSH and Liver panel at the same time.  You will receive a reminder letter in the mail two months in advance. If you don't receive a letter, please call our office to schedule the follow-up appointment.   Any Other Special Instructions Will Be Listed Below (If Applicable).     If you need a refill on your cardiac medications before your next appointment, please call your pharmacy.

## 2016-02-02 NOTE — Progress Notes (Signed)
Cardiology Office Note    Date:  02/02/2016   ID:  Mary Chaney, DOB 19-Jun-1927, MRN [id number]  PCP:  Lesleigh NoeHenry W Smith III, MD  Cardiologist: Lesleigh NoeHenry W Smith III, MD   Chief Complaint  Patient presents with  . Atrial Fibrillation    History of Present Illness:  Mary Chaney is a 82 y.o. female who presents for Chronic diastolic heart failure, paroxysmal atrial fib, hypertension, and amiodarone therapy.  She is doing well. She has not had syncope. No medication side effects. No bleeding on aspirin and Plavix. Recent ER visit for weakness and was felt to have dehydration.  Past Medical History:  Diagnosis Date  . Atrial fibrillation (HCC)   . Hemorrhoids   . Hypertension   . Toxic multinodular goiter     No past surgical history on file.  Current Medications: Outpatient Medications Prior to Visit  Medication Sig Dispense Refill  . amiodarone (PACERONE) 200 MG tablet Take 0.5 tablets (100 mg total) by mouth daily. 45 tablet 1  . amLODipine (NORVASC) 2.5 MG tablet TAKE 1 TABLET (2.5 MG TOTAL) BY MOUTH DAILY. 90 tablet 1  . aspirin 81 MG tablet Take 81 mg by mouth daily.    . methimazole (TAPAZOLE) 10 MG tablet Take 15 mg by mouth daily.     . valsartan-hydrochlorothiazide (DIOVAN-HCT) 320-25 MG tablet TAKE 1 TABLET BY MOUTH DAILY. 90 tablet 1  . amiodarone (PACERONE) 200 MG tablet Take 100 mg by mouth daily.    Marland Kitchen. EPINEPHrine (EPIPEN) 0.3 mg/0.3 mL IJ SOAJ injection Inject 0.3 mLs (0.3 mg total) into the muscle once. (Patient not taking: Reported on 02/02/2016) 2 Device 1   No facility-administered medications prior to visit.      Allergies:   Bee venom   Social History   Social History  . Marital status: Widowed    Spouse name: N/A  . Number of children: N/A  . Years of education: N/A   Social History Main Topics  . Smoking status: Former Smoker    Packs/day: 1.00    Quit date: 02/25/1983  . Smokeless tobacco: Never Used  . Alcohol use No  . Drug use: No  .  Sexual activity: Not Asked   Other Topics Concern  . None   Social History Narrative  . None     Family History:  The patient's family history includes Other in her father and mother.   ROS:   Please see the history of present illness.    Stiff right ankle otherwise no complaints. Emergency room visit in October where she was diagnosed with dehydration. Some left lower extremity swelling. DVT study done in the emergency room in October was unremarkable. All other systems reviewed and are negative.   PHYSICAL EXAM:   VS:  BP 124/72 (BP Location: Left Arm)   Pulse (!) 51   Ht 4\' 11"  (1.499 m)   Wt 136 lb 3.2 oz (61.8 kg)   BMI 27.51 kg/m    GEN: Well nourished, well developed, in no acute distress  HEENT: normal  Neck: no JVD, carotid bruits, or masses Cardiac: RRR; no murmurs, rubs, or gallops,no edema  Respiratory:  clear to auscultation bilaterally, normal work of breathing GI: soft, nontender, nondistended, + BS MS: no deformity or atrophy  Skin: warm and dry, no rash Neuro:  Alert and Oriented x 3, Strength and sensation are intact Psych: euthymic mood, full affect  Wt Readings from Last 3 Encounters:  02/02/16 136 lb 3.2 oz (  61.8 kg)  11/26/15 134 lb (60.8 kg)  11/26/15 134 lb (60.8 kg)      Studies/Labs Reviewed:   EKG:  EKG  Reviewed EKG done in October which was without significant abnormality.  Recent Labs: 11/26/2015: ALT 17; B Natriuretic Peptide 43.6; BUN 21; Creatinine, Ser 0.84; Hemoglobin 14.1; Magnesium 2.0; Platelets 225; Potassium 3.8; Sodium 136; TSH 2.876   Lipid Panel    Component Value Date/Time   CHOL  03/17/2008 0440    109        ATP III CLASSIFICATION:  <200     mg/dL   Desirable  161-096200-239  mg/dL   Borderline High  >=045>=240    mg/dL   High          TRIG 39 03/17/2008 0440   HDL 36 (L) 03/17/2008 0440   CHOLHDL 3.0 03/17/2008 0440   VLDL 8 03/17/2008 0440   LDLCALC  03/17/2008 0440    65        Total Cholesterol/HDL:CHD  Risk Coronary Heart Disease Risk Table                     Men   Women  1/2 Average Risk   3.4   3.3  Average Risk       5.0   4.4  2 X Average Risk   9.6   7.1  3 X Average Risk  23.4   11.0        Use the calculated Patient Ratio above and the CHD Risk Table to determine the patient's CHD Risk.        ATP III CLASSIFICATION (LDL):  <100     mg/dL   Optimal  [id number]  mg/dL   Near or Above                    Optimal  130-159  mg/dL   Borderline  [id number]  mg/dL   High  >956>190     mg/dL   Very High    Additional studies/ records that were reviewed today include:  Reviewed chest CT performed in October which revealed three-vessel coronary calcification. CT also suggested diffuse cardiac enlargement (mild)  Liver panel and TSH done October 1 were normal.    ASSESSMENT:    1. Essential hypertension   2. Atrial fibrillation, unspecified type (HCC)   3. Long term current use of anticoagulant therapy   4. H/O amiodarone therapy      PLAN:  In order of problems listed above:  1. Well controlled on current medical regimen. 2 g sodium diet is recommended. 2. Continue amiodarone 100 mg per day for suppression of atrial fib 3. No bleeding complications on aspirin daily 4. Amiodarone therapy without complications. TSH a panic panel on return.    Medication Adjustments/Labs and Tests Ordered: Current medicines are reviewed at length with the patient today.  Concerns regarding medicines are outlined above.  Medication changes, Labs and Tests ordered today are listed in the Patient Instructions below. There are no Patient Instructions on file for this visit.   Signed, Lesleigh NoeHenry W Smith III, MD  02/02/2016 1:40 PM    [facility] [address], [place], [place]  [address] Phone: [phone]; Fax: [phone]

## 2016-03-08 DIAGNOSIS — E0520 Thyrotoxicosis with toxic multinodular goiter without thyrotoxic crisis or storm: Secondary | ICD-10-CM | POA: Diagnosis not present

## 2016-03-08 DIAGNOSIS — Z79899 Other long term (current) drug therapy: Secondary | ICD-10-CM | POA: Diagnosis not present

## 2016-03-08 DIAGNOSIS — E0580 Other thyrotoxicosis without thyrotoxic crisis or storm: Secondary | ICD-10-CM | POA: Diagnosis not present

## 2016-04-03 ENCOUNTER — Other Ambulatory Visit: Payer: Self-pay | Admitting: Interventional Cardiology

## 2016-06-05 DIAGNOSIS — G8929 Other chronic pain: Secondary | ICD-10-CM | POA: Diagnosis not present

## 2016-06-05 DIAGNOSIS — M1711 Unilateral primary osteoarthritis, right knee: Secondary | ICD-10-CM | POA: Diagnosis not present

## 2016-06-05 DIAGNOSIS — M25561 Pain in right knee: Secondary | ICD-10-CM | POA: Diagnosis not present

## 2016-08-08 NOTE — Progress Notes (Signed)
Cardiology Office Note    Date:  08/09/2016   ID:  Mary Chaney, DOB June 16, 1927, MRN [id number]  PCP:  Lyn Records, MD  Cardiologist: Lesleigh Noe, MD   Chief Complaint  Patient presents with  . Atrial Fibrillation    History of Present Illness:  Mary Chaney is a 83 y.o. female  who presents for Chronic diastolic heart failure, paroxysmal atrial fib, hypertension, and amiodarone therapy.  Has noticed some lower extremity swelling involving the left ankle.. She actually has swelling in both lower extremities but left is worse and right. She denies orthopnea. It is difficult to keep her on point during conversation because she tends to wander. She denies orthopnea and PND. No exertional dyspnea.  Her daughter has encouraged increased fluid intake, so she has been forcing fluids.    Past Medical History:  Diagnosis Date  . Atrial fibrillation (HCC)   . Hemorrhoids   . Hypertension   . Toxic multinodular goiter     No past surgical history on file.  Current Medications: Outpatient Medications Prior to Visit  Medication Sig Dispense Refill  . aspirin 81 MG tablet Take 81 mg by mouth daily.    Marland Kitchen EPINEPHrine 0.3 mg/0.3 mL IJ SOAJ injection Inject 0.3 mg into the muscle as needed (allergies).    . methimazole (TAPAZOLE) 10 MG tablet Take 15 mg by mouth daily.     . valsartan-hydrochlorothiazide (DIOVAN-HCT) 320-25 MG tablet TAKE 1 TABLET BY MOUTH DAILY. 90 tablet 1  . amLODipine (NORVASC) 2.5 MG tablet TAKE 1 TABLET (2.5 MG TOTAL) BY MOUTH DAILY. 90 tablet 1  . amiodarone (PACERONE) 200 MG tablet TAKE 1/2 TABLEY DAILY (Patient not taking: Reported on 08/09/2016) 45 tablet 1   No facility-administered medications prior to visit.      Allergies:   Bee venom   Social History   Social History  . Marital status: Widowed    Spouse name: N/A  . Number of children: N/A  . Years of education: N/A   Social History Main Topics  . Smoking status: Former Smoker   Packs/day: 1.00    Quit date: 02/25/1983  . Smokeless tobacco: Never Used  . Alcohol use No  . Drug use: No  . Sexual activity: Not Asked   Other Topics Concern  . None   Social History Narrative  . None     Family History:  The patient's family history includes Other in her father and mother.   ROS:   Please see the history of present illness.    Recent episode of syncope while sitting in a chair. No premonitory symptoms. She is told no one about this until today when I asked her how she injured her left lower extremity. She states that the skin scraped off when she had the syncopal episode, postulating that she hit it on the chair on her way down. She denies chills, fever, and cough.  All other systems reviewed and are negative.   PHYSICAL EXAM:   VS:  BP (!) 100/54 (BP Location: Right Arm)   Pulse 69   Ht 4\' 11"  (1.499 m)   Wt 130 lb 12.8 oz (59.3 kg)   BMI 26.42 kg/m    GEN: Well nourished, well developed, in no acute distress  HEENT: normal  Neck: no JVD, carotid bruits, or masses Cardiac: I and RRR; no murmurs, rubs, or gallops. Left lower extremity edema including the dorsum of the foot up to the knee. Edema in  the left lower extremity is 2+. There is 1+ right lower extremity edema. Respiratory:  clear to auscultation bilaterally, normal work of breathing GI: soft, nontender, nondistended, + BS MS: no deformity or atrophy  Skin: warm and dry, no rash Neuro:  Alert and Oriented x 3, Strength and sensation are intact Psych: euthymic mood, full affect  Wt Readings from Last 3 Encounters:  08/09/16 130 lb 12.8 oz (59.3 kg)  02/02/16 136 lb 3.2 oz (61.8 kg)  11/26/15 134 lb (60.8 kg)      Studies/Labs Reviewed:   EKG:  EKG  Not repeated.  Recent Labs: 11/26/2015: ALT 17; B Natriuretic Peptide 43.6; BUN 21; Creatinine, Ser 0.84; Hemoglobin 14.1; Magnesium 2.0; Platelets 225; Potassium 3.8; Sodium 136; TSH 2.876   Lipid Panel    Component Value Date/Time   CHOL   03/17/2008 0440    109        ATP III CLASSIFICATION:  <200     mg/dL   Desirable  161-096  mg/dL   Borderline High  >=045    mg/dL   High          TRIG 39 03/17/2008 0440   HDL 36 (L) 03/17/2008 0440   CHOLHDL 3.0 03/17/2008 0440   VLDL 8 03/17/2008 0440   LDLCALC  03/17/2008 0440    65        Total Cholesterol/HDL:CHD Risk Coronary Heart Disease Risk Table                     Men   Women  1/2 Average Risk   3.4   3.3  Average Risk       5.0   4.4  2 X Average Risk   9.6   7.1  3 X Average Risk  23.4   11.0        Use the calculated Patient Ratio above and the CHD Risk Table to determine the patient's CHD Risk.        ATP III CLASSIFICATION (LDL):  <100     mg/dL   Optimal  409-811  mg/dL   Near or Above                    Optimal  130-159  mg/dL   Borderline  914-782  mg/dL   High  >956     mg/dL   Very High    Additional studies/ records that were reviewed today include:  No new data.    ASSESSMENT:    1. Paroxysmal atrial fibrillation (HCC)   2. Essential hypertension   3. Long term current use of amiodarone   4. Syncope, unspecified syncope type   5. Lower extremity edema      PLAN:  In order of problems listed above:  1. Clinically in normal sinus rhythm. 2. Blood pressure is relatively low today. Discontinue amlodipine. 3. Continue amiodarone at the current dose. We'll get a liver panel and TSH today. 4. Episode of syncope of uncertain etiology. Given her history of atrial fibrillation, we need to exclude the possibility of sinus node dysfunction/pauses/ventricular arrhythmia that could've been responsible. Thirty-day monitoring is indicated. 5. Lower extremity Doppler study will be done to exclude DVT. Moderate tension compression stockings to the level of the knee. BNP level. Amlodipine is discontinued.    Follow-up in 4-6 weeks with team member or myself.  Medication Adjustments/Labs and Tests Ordered: Current medicines are reviewed at length  with the patient today.  Concerns regarding medicines  are outlined above.  Medication changes, Labs and Tests ordered today are listed in the Patient Instructions below. Patient Instructions  Medication Instructions:  1) DISCONTINUE Amlodipine  Labwork: TSH, CMET and BNP today  Testing/Procedures: Your physician has recommended that you wear an event monitor. Event monitors are medical devices that record the heart's electrical activity. Doctors most often us these monitors to diagnose arrhythmias. Arrhythmias are problems with the speed or rhythm of the heartbeat. The monitor is a small, portable device. You can wear one while you do your normal daily activities. This is usually used to diagnose what is causing palpitations/syncope (passing out).  Your physician has requested that you have a lower or upper extremity venous duplex. This test is an ultrasound of the veins in the legs or arms. It looks at venous blood flow that carries blood from the heart to the legs or arms. Allow one hour for a Lower Venous exam. Allow thirty minutes for an Upper Venous exam. There are no restrictions or special instructions.   Follow-Up: Your physician recommends that you schedule a follow-up appointment in: 1 month with Dr. Katrinka BlazingSmith.   Any Other Special Instructions Will Be Listed Below (If Applicable).  Only drink when you feel thirsty.  Please do not overdo it.  This can cause worsening of the swelling.   Dr. Katrinka BlazingSmith would like for you to wear moderate tension knee high compression stockings.  Put these on the morning when, especially if you know you are going to be up on your feet for most of the day.  Take them off in the evenings.    If you need a refill on your cardiac medications before your next appointment, please call your pharmacy.      Signed, Lesleigh NoeHenry W Casy Tavano III, MD  08/09/2016 2:01 PM    [facility] [address], [place], [place]  [address] Phone: [phone]) [phone]; Fax:  [phone]

## 2016-08-09 ENCOUNTER — Encounter: Payer: Self-pay | Admitting: Interventional Cardiology

## 2016-08-09 ENCOUNTER — Ambulatory Visit (INDEPENDENT_AMBULATORY_CARE_PROVIDER_SITE_OTHER): Payer: Medicare Other | Admitting: Interventional Cardiology

## 2016-08-09 VITALS — BP 100/54 | HR 69 | Ht 59.0 in | Wt 130.8 lb

## 2016-08-09 DIAGNOSIS — I10 Essential (primary) hypertension: Secondary | ICD-10-CM | POA: Diagnosis not present

## 2016-08-09 DIAGNOSIS — Z79899 Other long term (current) drug therapy: Secondary | ICD-10-CM | POA: Diagnosis not present

## 2016-08-09 DIAGNOSIS — R55 Syncope and collapse: Secondary | ICD-10-CM | POA: Diagnosis not present

## 2016-08-09 DIAGNOSIS — I480 Paroxysmal atrial fibrillation: Secondary | ICD-10-CM | POA: Diagnosis not present

## 2016-08-09 DIAGNOSIS — R600 Localized edema: Secondary | ICD-10-CM | POA: Diagnosis not present

## 2016-08-09 NOTE — Patient Instructions (Addendum)
Medication Instructions:  1) DISCONTINUE Amlodipine  Labwork: TSH, CMET and BNP today  Testing/Procedures: Your physician has recommended that you wear an event monitor. Event monitors are medical devices that record the heart's electrical activity. Doctors most often us these monitors to diagnose arrhythmias. Arrhythmias are problems with the speed or rhythm of the heartbeat. The monitor is a small, portable device. You can wear one while you do your normal daily activities. This is usually used to diagnose what is causing palpitations/syncope (passing out).  Your physician has requested that you have a lower or upper extremity venous duplex. This test is an ultrasound of the veins in the legs or arms. It looks at venous blood flow that carries blood from the heart to the legs or arms. Allow one hour for a Lower Venous exam. Allow thirty minutes for an Upper Venous exam. There are no restrictions or special instructions.   Follow-Up: Your physician recommends that you schedule a follow-up appointment in: 1 month with Dr. Katrinka BlazingSmith.   Any Other Special Instructions Will Be Listed Below (If Applicable).  Only drink when you feel thirsty.  Please do not overdo it.  This can cause worsening of the swelling.   Dr. Katrinka BlazingSmith would like for you to wear moderate tension knee high compression stockings.  Put these on the morning when, especially if you know you are going to be up on your feet for most of the day.  Take them off in the evenings.    If you need a refill on your cardiac medications before your next appointment, please call your pharmacy.

## 2016-08-10 LAB — COMPREHENSIVE METABOLIC PANEL
A/G RATIO: 1.1 — AB (ref 1.2–2.2)
ALT: 14 IU/L (ref 0–32)
AST: 15 IU/L (ref 0–40)
Albumin: 3.5 g/dL (ref 3.5–4.7)
Alkaline Phosphatase: 73 IU/L (ref 39–117)
BUN/Creatinine Ratio: 24 (ref 12–28)
BUN: 16 mg/dL (ref 8–27)
CALCIUM: 8.9 mg/dL (ref 8.7–10.3)
CHLORIDE: 102 mmol/L (ref 96–106)
CO2: 31 mmol/L — ABNORMAL HIGH (ref 20–29)
Creatinine, Ser: 0.67 mg/dL (ref 0.57–1.00)
GFR calc Af Amer: 91 mL/min/{1.73_m2} (ref >59)
GFR, EST NON AFRICAN AMERICAN: 79 mL/min/{1.73_m2} (ref >59)
GLOBULIN, TOTAL: 3.1 g/dL (ref 1.5–4.5)
Glucose: 98 mg/dL (ref 65–99)
POTASSIUM: 4 mmol/L (ref 3.5–5.2)
SODIUM: 143 mmol/L (ref 134–144)
Total Protein: 6.6 g/dL (ref 6.0–8.5)

## 2016-08-10 LAB — TSH: TSH: 2.89 u[IU]/mL (ref 0.450–4.500)

## 2016-08-10 LAB — PRO B NATRIURETIC PEPTIDE: NT-Pro BNP: 288 pg/mL (ref 0–738)

## 2016-08-23 ENCOUNTER — Ambulatory Visit (INDEPENDENT_AMBULATORY_CARE_PROVIDER_SITE_OTHER): Payer: Medicare Other

## 2016-08-23 DIAGNOSIS — R55 Syncope and collapse: Secondary | ICD-10-CM

## 2016-08-30 ENCOUNTER — Ambulatory Visit (HOSPITAL_COMMUNITY)
Admission: RE | Admit: 2016-08-30 | Discharge: 2016-08-30 | Disposition: A | Discharge status: Home / Self Care | Payer: Medicare Other | Source: Ambulatory Visit | Attending: Cardiovascular Disease | Admitting: Cardiovascular Disease

## 2016-08-30 DIAGNOSIS — R600 Localized edema: Secondary | ICD-10-CM | POA: Insufficient documentation

## 2016-08-30 DIAGNOSIS — M7989 Other specified soft tissue disorders: Secondary | ICD-10-CM | POA: Diagnosis not present

## 2016-09-19 ENCOUNTER — Telehealth: Payer: Self-pay | Admitting: Interventional Cardiology

## 2016-09-19 NOTE — Telephone Encounter (Signed)
New Message  Pt call requesting to speak with RN about getting the correct size for her stockings. [facility] but they will not provide them without the correct size. Please call back to discuss

## 2016-09-19 NOTE — Telephone Encounter (Signed)
Pt had been given a script to get compression stockings and got them from somewhere else.  They were not right so now she wants to [facility] but needed new script.  Pt asked this be mailed.  Placed new script in mail to pt.  Pt appreciative for assistance.

## 2016-10-03 ENCOUNTER — Other Ambulatory Visit: Payer: Self-pay | Admitting: Interventional Cardiology

## 2016-10-04 ENCOUNTER — Encounter: Payer: Self-pay | Admitting: Interventional Cardiology

## 2016-10-04 ENCOUNTER — Ambulatory Visit (INDEPENDENT_AMBULATORY_CARE_PROVIDER_SITE_OTHER): Payer: Medicare Other | Admitting: Interventional Cardiology

## 2016-10-04 VITALS — BP 136/64 | HR 52 | Ht 59.0 in | Wt 125.8 lb

## 2016-10-04 DIAGNOSIS — R600 Localized edema: Secondary | ICD-10-CM

## 2016-10-04 DIAGNOSIS — R55 Syncope and collapse: Secondary | ICD-10-CM

## 2016-10-04 DIAGNOSIS — Z7901 Long term (current) use of anticoagulants: Secondary | ICD-10-CM

## 2016-10-04 DIAGNOSIS — I480 Paroxysmal atrial fibrillation: Secondary | ICD-10-CM

## 2016-10-04 DIAGNOSIS — Z9229 Personal history of other drug therapy: Secondary | ICD-10-CM

## 2016-10-04 NOTE — Progress Notes (Signed)
Cardiology Office Note    Date:  10/04/2016   ID:  Mary Chaney, DOB 06-Jun-1927, MRN [id number]  PCP:  Lyn Records, MD  Cardiologist: Lesleigh Noe, MD   Chief Complaint  Patient presents with  . Atrial Fibrillation    History of Present Illness:  Mary Chaney is a 83 y.o. female who presents for Chronic diastolic heart failure, paroxysmal atrial fib, hypertension, and amiodarone therapy.  She has had no recurrent syncope. The 30 day monitor did not reveal any cardiac rhythm etiology for potential fainting spell.  Off amlodipine and with compression stockings, lower extremity swelling is improved.   Past Medical History:  Diagnosis Date  . Atrial fibrillation (HCC)   . Hemorrhoids   . Hypertension   . Toxic multinodular goiter     No past surgical history on file.  Current Medications: Outpatient Medications Prior to Visit  Medication Sig Dispense Refill  . amiodarone (PACERONE) 200 MG tablet Take 0.5 tablets (100 mg total) by mouth daily. 45 tablet 3  . aspirin 81 MG tablet Take 81 mg by mouth daily.    Marland Kitchen EPINEPHrine 0.3 mg/0.3 mL IJ SOAJ injection Inject 0.3 mg into the muscle as needed (allergies).    . methimazole (TAPAZOLE) 10 MG tablet Take 15 mg by mouth daily.     . valsartan-hydrochlorothiazide (DIOVAN-HCT) 320-25 MG tablet Take 1 tablet by mouth daily. 90 tablet 3  . amiodarone (PACERONE) 200 MG tablet Take 100 mg by mouth daily.     No facility-administered medications prior to visit.      Allergies:   Bee venom   Social History   Social History  . Marital status: Widowed    Spouse name: N/A  . Number of children: N/A  . Years of education: N/A   Social History Main Topics  . Smoking status: Former Smoker    Packs/day: 1.00    Quit date: 02/25/1983  . Smokeless tobacco: Never Used  . Alcohol use No  . Drug use: No  . Sexual activity: Not Asked   Other Topics Concern  . None   Social History Narrative  . None     Family  History:  The patient's family history includes Other in her father and mother.   ROS:   Please see the history of present illness.    She still complains of the left lower extremity swelling and being painful.  All other systems reviewed and are negative.   PHYSICAL EXAM:   VS:  BP 136/64 (BP Location: Left Arm)   Pulse (!) 52   Ht 4\' 11"  (1.499 m)   Wt 125 lb 12.8 oz (57.1 kg)   BMI 25.41 kg/m    GEN: Well nourished, well developed, in no acute distress  HEENT: normal  Neck: no JVD, carotid bruits, or masses Cardiac: RRR; no murmurs, rubs, or gallops,no edema  Respiratory:  clear to auscultation bilaterally, normal work of breathing GI: soft, nontender, nondistended, + BS MS: no deformity or atrophy  Skin: warm and dry, no rash Neuro:  Alert and Oriented x 3, Strength and sensation are intact Psych: euthymic mood, full affect  Wt Readings from Last 3 Encounters:  10/04/16 125 lb 12.8 oz (57.1 kg)  08/09/16 130 lb 12.8 oz (59.3 kg)  02/02/16 136 lb 3.2 oz (61.8 kg)      Studies/Labs Reviewed:   EKG:  EKG  Not repeated.  Recent Labs: 11/26/2015: B Natriuretic Peptide 43.6; Hemoglobin 14.1; Magnesium 2.0;  Platelets 225 08/09/2016: ALT 14; BUN 16; Creatinine, Ser 0.67; NT-Pro BNP 288; Potassium 4.0; Sodium 143; TSH 2.890   Lipid Panel    Component Value Date/Time   CHOL  03/17/2008 0440    109        ATP III CLASSIFICATION:  <200     mg/dL   Desirable  865-784200-239  mg/dL   Borderline High  >=696>=240    mg/dL   High          TRIG 39 03/17/2008 0440   HDL 36 (L) 03/17/2008 0440   CHOLHDL 3.0 03/17/2008 0440   VLDL 8 03/17/2008 0440   LDLCALC  03/17/2008 0440    65        Total Cholesterol/HDL:CHD Risk Coronary Heart Disease Risk Table                     Men   Women  1/2 Average Risk   3.4   3.3  Average Risk       5.0   4.4  2 X Average Risk   9.6   7.1  3 X Average Risk  23.4   11.0        Use the calculated Patient Ratio above and the CHD Risk Table to  determine the patient's CHD Risk.        ATP III CLASSIFICATION (LDL):  <100     mg/dL   Optimal  295-284100-129  mg/dL   Near or Above                    Optimal  130-159  mg/dL   Borderline  132-440160-189  mg/dL   High  >102>190     mg/dL   Very High    Additional studies/ records that were reviewed today include:  Thirty-day continuous monitor performed on 08/23/2016: Study Highlights     Normal sinus and sinus bradycardia.  Incomplete study as moniotr was worn only limited time frame.   Monitor disconnected most of time. No abnormality noted during brief period of monitoring   Bilateral lower extremity vascular ultrasound, venous: 08/30/2016 No evidence of left lower extremity deep or superficial venous thrombus or incompetence  ASSESSMENT:    1. Paroxysmal atrial fibrillation (HCC)   2. H/O amiodarone therapy   3. Long term current use of anticoagulant therapy   4. Syncope, unspecified syncope type   5. Lower extremity edema      PLAN:  In order of problems listed above:  1. No recurrence of atrial fibrillation noted on 30 day monitor. No bradycardia to explain episode of syncope 2. No evidence of amiodarone toxicity. She is on 100 mg daily. Return in 6 months for TSH and hepatic panel. 3. No bleeding complications on current medical regimen which includes only an aspirin daily. 4. Syncope etiology unidentified. 5. Left greater than right lower extremity edema is improved with discontinuation of amlodipine, moderate tension support stockings, and elevation. No evidence of DVT.  Clinical follow-up in 6 months for amiodarone surveillance.  Medication Adjustments/Labs and Tests Ordered: Current medicines are reviewed at length with the patient today.  Concerns regarding medicines are outlined above.  Medication changes, Labs and Tests ordered today are listed in the Patient Instructions below. There are no Patient Instructions on file for this visit.   Signed, Lesleigh NoeHenry W Audrey Thull III,  MD  10/04/2016 4:06 PM    [facility] [address], [place], [place]  [address] Phone: [phone]  [phone]; Fax: [phone]

## 2016-10-04 NOTE — Patient Instructions (Signed)
Medication Instructions:  None  Labwork: None  Testing/Procedures: None  Follow-Up: Your physician wants you to follow-up in: 6 months with Dr. Smith.  You will receive a reminder letter in the mail two months in advance. If you don't receive a letter, please call our office to schedule the follow-up appointment.   Any Other Special Instructions Will Be Listed Below (If Applicable).     If you need a refill on your cardiac medications before your next appointment, please call your pharmacy.   

## 2016-11-13 ENCOUNTER — Ambulatory Visit (HOSPITAL_COMMUNITY)
Admission: EM | Admit: 2016-11-13 | Discharge: 2016-11-13 | Disposition: A | Discharge status: Home / Self Care | Payer: Medicare Other | Attending: Family Medicine | Admitting: Family Medicine

## 2016-11-13 ENCOUNTER — Encounter (HOSPITAL_COMMUNITY): Payer: Self-pay | Admitting: Family Medicine

## 2016-11-13 DIAGNOSIS — R002 Palpitations: Secondary | ICD-10-CM
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
## 2016-11-23 DIAGNOSIS — Z23 Encounter for immunization: Secondary | ICD-10-CM | POA: Diagnosis not present

## 2016-12-26 DIAGNOSIS — Z23 Encounter for immunization: Secondary | ICD-10-CM | POA: Diagnosis not present

## 2016-12-26 DIAGNOSIS — R160 Hepatomegaly, not elsewhere classified: Secondary | ICD-10-CM | POA: Diagnosis not present

## 2016-12-26 DIAGNOSIS — I482 Chronic atrial fibrillation: Secondary | ICD-10-CM | POA: Diagnosis not present

## 2016-12-26 DIAGNOSIS — I10 Essential (primary) hypertension: Secondary | ICD-10-CM | POA: Diagnosis not present

## 2016-12-26 DIAGNOSIS — E0580 Other thyrotoxicosis without thyrotoxic crisis or storm: Secondary | ICD-10-CM | POA: Diagnosis not present

## 2016-12-26 DIAGNOSIS — Z0000 Encounter for general adult medical examination without abnormal findings: Secondary | ICD-10-CM | POA: Diagnosis not present

## 2016-12-26 DIAGNOSIS — E0520 Thyrotoxicosis with toxic multinodular goiter without thyrotoxic crisis or storm: Secondary | ICD-10-CM | POA: Diagnosis not present

## 2017-03-14 DIAGNOSIS — Z5181 Encounter for therapeutic drug level monitoring: Secondary | ICD-10-CM | POA: Diagnosis not present

## 2017-03-14 DIAGNOSIS — Z79899 Other long term (current) drug therapy: Secondary | ICD-10-CM | POA: Diagnosis not present

## 2017-03-14 DIAGNOSIS — E0520 Thyrotoxicosis with toxic multinodular goiter without thyrotoxic crisis or storm: Secondary | ICD-10-CM | POA: Diagnosis not present

## 2017-03-14 DIAGNOSIS — E0580 Other thyrotoxicosis without thyrotoxic crisis or storm: Secondary | ICD-10-CM | POA: Diagnosis not present
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
## 2017-04-28 ENCOUNTER — Encounter: Payer: Self-pay | Admitting: Interventional Cardiology

## 2017-04-28 ENCOUNTER — Ambulatory Visit (INDEPENDENT_AMBULATORY_CARE_PROVIDER_SITE_OTHER): Payer: Medicare Other | Admitting: Interventional Cardiology

## 2017-04-28 VITALS — BP 140/92 | HR 109 | Ht 59.0 in | Wt 128.8 lb

## 2017-04-28 DIAGNOSIS — Z7901 Long term (current) use of anticoagulants: Secondary | ICD-10-CM | POA: Diagnosis not present

## 2017-04-28 DIAGNOSIS — I480 Paroxysmal atrial fibrillation: Secondary | ICD-10-CM

## 2017-04-28 DIAGNOSIS — I10 Essential (primary) hypertension: Secondary | ICD-10-CM

## 2017-04-28 DIAGNOSIS — Z9229 Personal history of other drug therapy: Secondary | ICD-10-CM

## 2017-04-28 MED ORDER — AMIODARONE HCL 200 MG PO TABS: 200.0000 mg | ORAL_TABLET | Freq: Two times a day (BID) | ORAL | 5 refills | Status: DC

## 2017-04-28 MED ORDER — APIXABAN 5 MG PO TABS: 5.0000 mg | ORAL_TABLET | Freq: Two times a day (BID) | ORAL | 11 refills | Status: DC

## 2017-04-28 NOTE — Patient Instructions (Signed)
Medication Instructions:  1) START Eliquis 5mg - one tablet twice daily 2) INCREASE Amiodarone to 200mg  twice daily 3) STOP Aspirin  Labwork: None  Testing/Procedures: None  Follow-Up: Your physician recommends that you schedule a follow-up appointment in: 2 weeks with an APP with an EKG   Any Other Special Instructions Will Be Listed Below (If Applicable).     If you need a refill on your cardiac medications before your next appointment, please call your pharmacy.

## 2017-05-15 ENCOUNTER — Ambulatory Visit: Payer: Medicare Other | Admitting: Cardiology

## 2017-05-22 ENCOUNTER — Encounter: Payer: Self-pay | Admitting: Cardiology

## 2017-05-22 ENCOUNTER — Ambulatory Visit (INDEPENDENT_AMBULATORY_CARE_PROVIDER_SITE_OTHER): Payer: Medicare Other | Admitting: Cardiology

## 2017-05-22 VITALS — BP 144/90 | HR 93 | Ht 59.0 in | Wt 128.8 lb

## 2017-05-22 DIAGNOSIS — I481 Persistent atrial fibrillation: Secondary | ICD-10-CM

## 2017-05-22 DIAGNOSIS — Z7901 Long term (current) use of anticoagulants: Secondary | ICD-10-CM | POA: Diagnosis not present

## 2017-05-22 DIAGNOSIS — I10 Essential (primary) hypertension: Secondary | ICD-10-CM | POA: Diagnosis not present

## 2017-05-22 DIAGNOSIS — I4819 Other persistent atrial fibrillation: Secondary | ICD-10-CM

## 2017-05-22 MED ORDER — METOPROLOL SUCCINATE ER 25 MG PO TB24: 25.0000 mg | ORAL_TABLET | Freq: Every day | ORAL | 3 refills | Status: DC

## 2017-05-22 MED ORDER — RIVAROXABAN 15 MG PO TABS: 15.0000 mg | ORAL_TABLET | Freq: Every day | ORAL | 0 refills | Status: DC

## 2017-05-22 NOTE — Patient Instructions (Signed)
Medication Instructions:  Your physician has recommended you make the following change in your medication:   STOP Eliquis (Apixaban) START Xarelto (Rivaroxaban) 15 mg once daily - take with your heaviest meal STOP Amiodarone (Pacerone) START Toprol (Toprol XL) 25 mg once daily   Labwork: Your physician recommends that you return for lab work in: 1 month for CBC and BMET   Testing/Procedures: Your physician has requested that you have an echocardiogram. Echocardiography is a painless test that uses sound waves to create images of your heart. It provides your doctor with information about the size and shape of your heart and how well your heart's chambers and valves are working. This procedure takes approximately one hour. There are no restrictions for this procedure.  Follow-Up: Your physician recommends that you schedule a follow-up appointment in: 1 month with CVRR for new Xarelto start  Your physician recommends that you schedule a follow-up appointment in: 1 month with APP on same day as CVRR appointment    If you need a refill on your cardiac medications before your next appointment, please call your pharmacy.   Thank you for choosing [facility]! Eligha BridegroomMichelle Nhi Butrum, RN [phone]
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
## 2017-05-30 ENCOUNTER — Ambulatory Visit (HOSPITAL_COMMUNITY): Payer: Medicare Other | Attending: Cardiovascular Disease

## 2017-05-30 ENCOUNTER — Other Ambulatory Visit: Payer: Self-pay

## 2017-05-30 DIAGNOSIS — Z87891 Personal history of nicotine dependence: Secondary | ICD-10-CM | POA: Insufficient documentation

## 2017-05-30 DIAGNOSIS — I4819 Other persistent atrial fibrillation: Secondary | ICD-10-CM

## 2017-05-30 DIAGNOSIS — I088 Other rheumatic multiple valve diseases: Secondary | ICD-10-CM | POA: Diagnosis not present

## 2017-05-30 DIAGNOSIS — I10 Essential (primary) hypertension: Secondary | ICD-10-CM | POA: Insufficient documentation

## 2017-05-30 DIAGNOSIS — I481 Persistent atrial fibrillation: Secondary | ICD-10-CM

## 2017-06-19 ENCOUNTER — Other Ambulatory Visit: Payer: Self-pay

## 2017-06-20 MED ORDER — RIVAROXABAN 15 MG PO TABS: 15.0000 mg | ORAL_TABLET | Freq: Every day | ORAL | 0 refills | Status: DC

## 2017-06-20 NOTE — Telephone Encounter (Signed)
Pt is a 84 yr old female who was seen on 05/22/17 in the office. Her weight at that time was 58.4 Kg. Last SCr on 03/14/17 was 0.69 from PCP. Cr Cl is 52 mL/min. Will refill Xarelto

## 2017-06-23 ENCOUNTER — Telehealth: Payer: Self-pay | Admitting: *Deleted

## 2017-06-23 MED ORDER — RIVAROXABAN 20 MG PO TABS: 20.0000 mg | ORAL_TABLET | Freq: Every day | ORAL | 3 refills | Status: DC

## 2017-06-23 NOTE — Telephone Encounter (Signed)
Will send in Rx for  of Xarelto and call pt and make her aware.

## 2017-06-23 NOTE — Telephone Encounter (Signed)
-----   Message from Lyn Records, MD sent at 06/21/2017  4:53 PM EDT ----- Regarding: RE: Xarelto Dosage  Increase to the correct dose. ----- Message ----- From: Raul Del, RN Sent: 06/20/2017   2:41 PM To: Lyn Records, MD, Berton Bon, NP Subject: Carlena Hurl Dosage                                 Pt was seen in office by you on 05/22/17. Her Eliquis was discontinued on that visit due to severe HAs per your note and Xarelto  was started. Her creatine clearance is 52 mL/min. Should we increase her to  QD because she is above 50 mL/min or keep her on low dose due to her headaches? Please Advise. Thank you.

## 2017-07-04 ENCOUNTER — Ambulatory Visit (INDEPENDENT_AMBULATORY_CARE_PROVIDER_SITE_OTHER): Payer: Medicare Other | Admitting: *Deleted

## 2017-07-04 ENCOUNTER — Ambulatory Visit (INDEPENDENT_AMBULATORY_CARE_PROVIDER_SITE_OTHER): Payer: Medicare Other | Admitting: Cardiology

## 2017-07-04 ENCOUNTER — Encounter: Payer: Self-pay | Admitting: Cardiology

## 2017-07-04 ENCOUNTER — Ambulatory Visit: Payer: Medicare Other

## 2017-07-04 VITALS — BP 138/80 | HR 69 | Ht 59.0 in | Wt 128.0 lb

## 2017-07-04 DIAGNOSIS — I482 Chronic atrial fibrillation: Secondary | ICD-10-CM | POA: Diagnosis not present

## 2017-07-04 DIAGNOSIS — Z7901 Long term (current) use of anticoagulants: Secondary | ICD-10-CM

## 2017-07-04 DIAGNOSIS — I10 Essential (primary) hypertension: Secondary | ICD-10-CM | POA: Diagnosis not present

## 2017-07-04 DIAGNOSIS — I480 Paroxysmal atrial fibrillation: Secondary | ICD-10-CM | POA: Diagnosis not present

## 2017-07-04 DIAGNOSIS — I4821 Permanent atrial fibrillation: Secondary | ICD-10-CM
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
## 2017-07-04 NOTE — Progress Notes (Signed)
Pt was started on Xarelto  for Afib on 05/22/17 after OV when Eliquis was discontinued. On 06/23/17 note sent to Dr. Katrinka Blazing and Xarelto dosage was increased to Xarelto  as her CrCl was 52 mL/min. Pt saw NP, Jeraldine Loots today in office. Her weight today was 58.18 Kg.   Reviewed patients medication list.  Pt is not currently on any combined P-gp and strong CYP3A4 inhibitors/inducers (ketoconazole, traconazole, ritonavir, carbamazepine, phenytoin, rifampin, St. John's wort).  Reviewed labs: SCr-0.80, Hgb-13.2, HCT-39.2, Weight-58.18kg (128lbs), CrCl- 44.38ml/min.  Dose inappropriate based on CrCl.  Hgb and HCT within normal limits.   A full discussion of the nature of anticoagulants has been carried out.  A benefit/risk analysis has been presented to the patient, so that they understand the justification for choosing anticoagulation with Xarelto at this time.  The need for compliance is stressed.  Pt is aware to take the medication once daily with the largest meal of the day.  Side effects of potential bleeding are discussed, including unusual colored urine or stools, coughing up blood or coffee ground emesis, nose bleeds or serious fall or head trauma.  Discussed signs and symptoms of stroke. The patient should avoid any OTC items containing aspirin or ibuprofen.  Avoid alcohol consumption.   Call if any signs of abnormal bleeding.  Discussed financial obligations and resolved any difficulty in obtaining medication.    07/07/17-discussed labs with pt and sending Dr. Katrinka Blazing a note regarding the pt's CrCl. Pt should be on Xarelto  per CrCl of 44.9ml/min. Pt is aware that a message will be sent to Dr. Katrinka Blazing & she will continue the Xarelto  at this time & await an update. AWAITING STAFF MESSAGE FROM DR. SMITH.  07/09/17 Called pt & had to leave a message for pt to call back regarding orders from Dr. Katrinka Blazing.   Message from Lyn Records, MD sent at 07/07/2017  8:34 PM EDT ----- Continue 20 mg. Repeat BMET 2  weeks. Encourage fluid intake.   07/09/17: Pt returned the call and pt was instructed of orders per Dr. Katrinka Blazing. Pt will continue taking Xarelto  daily and have labs repeated on 07/25/17. Also, pt was encouraged to drink more fluids and she was agreeble with the plan. Orders placed and lab appt placed.

## 2017-07-04 NOTE — Patient Instructions (Signed)
Medication Instructions:   Your physician recommends that you continue on your current medications as directed. Please refer to the Current Medication list given to you today.   If you need a refill on your cardiac medications before your next appointment, please call your pharmacy.  Labwork: NONE ORDERED  TODAY    Testing/Procedures: NONE ORDERED  TODAY    Follow-Up:  WITH DR Katrinka Blazing OR AVAILABLE APP Your physician wants you to follow-up in:  IN  6  MONTHS WITH DR Marlou Starks will receive a reminder letter in the mail two months in advance. If you don't receive a letter, please call our office to schedule the follow-up appointment.      Any Other Special Instructions Will Be Listed Below (If Applicable).

## 2017-07-05 LAB — CBC
Hematocrit: 39.2 % (ref 34.0–46.6)
Hemoglobin: 13.2 g/dL (ref 11.1–15.9)
MCH: 29.1 pg (ref 26.6–33.0)
MCHC: 33.7 g/dL (ref 31.5–35.7)
MCV: 86 fL (ref 79–97)
PLATELETS: 233 10*3/uL (ref 150–379)
RBC: 4.54 x10E6/uL (ref 3.77–5.28)
RDW: 15.1 % (ref 12.3–15.4)
WBC: 5 10*3/uL (ref 3.4–10.8)

## 2017-07-05 LAB — BASIC METABOLIC PANEL
BUN / CREAT RATIO: 24 (ref 12–28)
BUN: 19 mg/dL (ref 8–27)
CHLORIDE: 99 mmol/L (ref 96–106)
CO2: 22 mmol/L (ref 20–29)
Calcium: 9.2 mg/dL (ref 8.7–10.3)
Creatinine, Ser: 0.8 mg/dL (ref 0.57–1.00)
GFR calc non Af Amer: 66 mL/min/{1.73_m2} (ref >59)
GFR, EST AFRICAN AMERICAN: 76 mL/min/{1.73_m2} (ref >59)
GLUCOSE: 104 mg/dL — AB (ref 65–99)
POTASSIUM: 3.9 mmol/L (ref 3.5–5.2)
SODIUM: 137 mmol/L (ref 134–144)

## 2017-07-08 ENCOUNTER — Encounter: Payer: Self-pay | Admitting: *Deleted

## 2017-07-09 ENCOUNTER — Telehealth: Payer: Self-pay | Admitting: *Deleted

## 2017-07-09 DIAGNOSIS — I4891 Unspecified atrial fibrillation: Secondary | ICD-10-CM

## 2017-07-09 NOTE — Telephone Encounter (Signed)
-----   Message from Lyn Records, MD sent at 07/07/2017  8:34 PM EDT ----- Continue 20 mg. Repeat BMET 2 weeks. Encourage fluid intake. ----- Message ----- From: Kandace Parkins, RN Sent: 07/07/2017   4:21 PM To: Lyn Records, MD  Dr. Katrinka Blazing, This pt was recently started on Xarelto  for Afib on 05/22/17 at her office visit and her Eliquis was discontinued. Then on 06/23/17, Tiffany RN sent you a message regarding her CrCl-67ml/min, and per that message her Xarelto dose was increased to . Pt has been taking Xarelto  and while she was here to see Berton Bon on 07/04/17, she had labs drawn and her CrCl is now 44.25ml/min, which prompts a dose change.  Pt just picked up the Xarelto  last week and has plenty on hand.  Please advise & Thank you.

## 2017-07-09 NOTE — Telephone Encounter (Addendum)
Left message on voicemail for pt to callback regarding an update from Dr. Katrinka Blazing; pt to call the [facility] for further details.   Spoke with pt & she is aware that labs need to be repeated in 2 weeks per Dr. Katrinka Blazing and to continue taking Xarelto  daily. Pt states she does usually drink water but she will start today. Appt set for 2 weeks on a Friday because her car is in the shop & she should have it back within 2 weeks. Pt aware to continue Xarelto  daily at this time.

## 2017-07-25 ENCOUNTER — Other Ambulatory Visit: Payer: Medicare Other | Admitting: *Deleted

## 2017-07-25 DIAGNOSIS — I4891 Unspecified atrial fibrillation: Secondary | ICD-10-CM

## 2017-07-26 LAB — BASIC METABOLIC PANEL
BUN/Creatinine Ratio: 21 (ref 12–28)
BUN: 18 mg/dL (ref 8–27)
CO2: 31 mmol/L — AB (ref 20–29)
Calcium: 9.7 mg/dL (ref 8.7–10.3)
Chloride: 102 mmol/L (ref 96–106)
Creatinine, Ser: 0.84 mg/dL (ref 0.57–1.00)
GFR, EST AFRICAN AMERICAN: 72 mL/min/{1.73_m2} (ref >59)
GFR, EST NON AFRICAN AMERICAN: 62 mL/min/{1.73_m2} (ref >59)
Glucose: 88 mg/dL (ref 65–99)
POTASSIUM: 4.1 mmol/L (ref 3.5–5.2)
SODIUM: 143 mmol/L (ref 134–144)

## 2017-08-04 ENCOUNTER — Telehealth: Payer: Self-pay | Admitting: *Deleted

## 2017-08-04 MED ORDER — RIVAROXABAN 15 MG PO TABS: 15.0000 mg | ORAL_TABLET | Freq: Every day | ORAL | 6 refills | Status: DC

## 2017-08-04 NOTE — Telephone Encounter (Addendum)
08/04/17-Called pt regarding message from Dr. Katrinka BlazingSmith that her Xarelto dose needs to be reduced to 15mg . Patient was upset as she has 17 days worth of 20mg  left. She was willing to reduce her dose after stating they cost was $40 each time she had to get it filled. Apologized to the pt and called refill dept to ask for samples of Xarelto 15mg . Xarelto 15mg  samples for 2 weeks will be left up front for pt to pick up. Pt updated to come by and pick up samples and she verbalized understanding.  Also, pt aware that Xarelto 15mg  refill will be sent to pharmacy.  ----- Message from Lyn RecordsHenry W Smith, MD sent at 08/03/2017 12:11 PM EDT ----- Needs to go back to 15 mg daily. ----- Message ----- From: Kandace ParkinsHemphill, Candance B, RN Sent: 08/01/2017   4:30 PM To: Lyn RecordsHenry W Smith, MD  Hi Dr. Katrinka BlazingSmith, This pt was recently started on Xarelto 15mg  for Afib on 05/22/17 at her office visit and her Eliquis was discontinued. Then on 06/23/17, Tiffany RN sent you a message regarding her CrCl-1652ml/min, and per that message her Xarelto dose was increased to 20mg . Pt has been taking Xarelto 20mg  and while she was here to see Berton BonJanine Hammond on 07/04/17, she had labs drawn and her CrCl was 44.565ml/min, which prompted a dose change. On 07/07/17, I messaged you regarding this and you gave me orders to have pt continue 20 mg and repeat BMET 2 weeks and encourage fluid intake. Pt states she increased fluid intake and CrCl is now  42.6246ml/min from 07/25/17 labs and this prompts a dose change.  Pt just picked up the Xarelto 20mg  a couple weeks ago and has plenty on hand.  Please advise on the dose & Thank You.

## 2017-09-12 DIAGNOSIS — Z79899 Other long term (current) drug therapy: Secondary | ICD-10-CM | POA: Diagnosis not present

## 2017-09-12 DIAGNOSIS — E0580 Other thyrotoxicosis without thyrotoxic crisis or storm: Secondary | ICD-10-CM | POA: Diagnosis not present

## 2017-09-12 DIAGNOSIS — E0520 Thyrotoxicosis with toxic multinodular goiter without thyrotoxic crisis or storm: Secondary | ICD-10-CM | POA: Diagnosis not present

## 2017-09-28 ENCOUNTER — Other Ambulatory Visit: Payer: Self-pay | Admitting: Interventional Cardiology

## 2017-12-12 DIAGNOSIS — Z79899 Other long term (current) drug therapy: Secondary | ICD-10-CM | POA: Diagnosis not present

## 2017-12-12 DIAGNOSIS — E0580 Other thyrotoxicosis without thyrotoxic crisis or storm: Secondary | ICD-10-CM | POA: Diagnosis not present

## 2017-12-12 DIAGNOSIS — E0520 Thyrotoxicosis with toxic multinodular goiter without thyrotoxic crisis or storm: Secondary | ICD-10-CM | POA: Diagnosis not present

## 2018-01-01 NOTE — Progress Notes (Signed)
Cardiology Office Note:    Date:  01/02/2018   ID:  Mary Chaney, DOB Jan 10, 1928, MRN [id number]  PCP:  Patient, No Pcp Per  Cardiologist:  Sinclair Grooms, MD   Referring MD: No ref. provider found   Chief Complaint  Patient presents with  . Atrial Fibrillation    History of Present Illness:    Mary Chaney is a 84 y.o. female with a hx of chronic diastolic heart failure, persistent atrial fibrillation on Xarelto, hypertension and toxic multinodular goiter.  Doing quite well.  Denies chest discomfort.  Breathing okay.  Was having some dizziness during the day until she switch Diovan HCT to nighttime.  No palpitations or chest pain.  No episodes of syncope.  Past Medical History:  Diagnosis Date  . Atrial fibrillation ([place])   . Hemorrhoids   . Hypertension   . Toxic multinodular goiter     History reviewed. No pertinent surgical history.  Current Medications: Current Meds  Medication Sig  . methimazole (TAPAZOLE) 10 MG tablet Take 15 mg by mouth daily.   . metoprolol succinate (TOPROL XL) 25 MG 24 hr tablet Take 1 tablet (25 mg total) by mouth daily.  . Rivaroxaban (XARELTO) 15 MG TABS tablet Take 1 tablet (15 mg total) by mouth daily with supper.  . valsartan-hydrochlorothiazide (DIOVAN-HCT) 320-25 MG tablet TAKE 1 TABLET BY MOUTH DAILY.     Allergies:   Bee venom   Social History   Socioeconomic History  . Marital status: Widowed    Spouse name: Not on file  . Number of children: Not on file  . Years of education: Not on file  . Highest education level: Not on file  Occupational History  . Not on file  Social Needs  . Financial resource strain: Not on file  . Food insecurity:    Worry: Not on file    Inability: Not on file  . Transportation needs:    Medical: Not on file    Non-medical: Not on file  Tobacco Use  . Smoking status: Former Smoker    Packs/day: 1.00    Last attempt to quit: 02/25/1983    Years since quitting: 34.8  . Smokeless  tobacco: Never Used  Substance and Sexual Activity  . Alcohol use: No    Alcohol/week: 0.0 standard drinks  . Drug use: No  . Sexual activity: Not on file  Lifestyle  . Physical activity:    Days per week: Not on file    Minutes per session: Not on file  . Stress: Not on file  Relationships  . Social connections:    Talks on phone: Not on file    Gets together: Not on file    Attends religious service: Not on file    Active member of club or organization: Not on file    Attends meetings of clubs or organizations: Not on file    Relationship status: Not on file  Other Topics Concern  . Not on file  Social History Narrative  . Not on file     Family History: The patient's family history includes Other in her father and mother.  ROS:   Please see the history of present illness.    None all other systems reviewed and are negative.  EKGs/Labs/Other Studies Reviewed:    The following studies were reviewed today: None  EKG:  EKG is no ordered today.    Recent Labs: 07/04/2017: Hemoglobin 13.2; Platelets 233 07/25/2017: BUN 18; Creatinine, Ser  0.84; Potassium 4.1; Sodium 143  Recent Lipid Panel    Component Value Date/Time   CHOL  03/17/2008 0440    109        ATP III CLASSIFICATION:  <200     mg/dL   Desirable  200-239  mg/dL   Borderline High  >=240    mg/dL   High          TRIG 39 03/17/2008 0440   HDL 36 (L) 03/17/2008 0440   CHOLHDL 3.0 03/17/2008 0440   VLDL 8 03/17/2008 0440   LDLCALC  03/17/2008 0440    65        Total Cholesterol/HDL:CHD Risk Coronary Heart Disease Risk Table                     Men   Women  1/2 Average Risk   3.4   3.3  Average Risk       5.0   4.4  2 X Average Risk   9.6   7.1  3 X Average Risk  23.4   11.0        Use the calculated Patient Ratio above and the CHD Risk Table to determine the patient's CHD Risk.        ATP III CLASSIFICATION (LDL):  <100     mg/dL   Optimal  100-129  mg/dL   Near or Above                     Optimal  130-159  mg/dL   Borderline  160-189  mg/dL   High  >190     mg/dL   Very High    Physical Exam:    VS:  BP 114/78   Pulse 87   Ht 4' 11"  (1.499 m)   Wt 125 lb (56.7 kg)   SpO2 95%   BMI 25.25 kg/m     Wt Readings from Last 3 Encounters:  01/02/18 125 lb (56.7 kg)  07/04/17 128 lb (58.1 kg)  05/22/17 128 lb 12.8 oz (58.4 kg)     GEN:  Well nourished, well developed in no acute distress HEENT: Normal NECK: No JVD.Marland Kitchen Goiter large on the right neck than left LYMPHATICS: No lymphadenopathy CARDIAC: IIRR, no murmur, no gallop, no edema. VASCULAR: 2+ bilateral radial and carotid pulses.  No bruits. RESPIRATORY:  Clear to auscultation without rales, wheezing or rhonchi  ABDOMEN: Soft, non-tender, non-distended, No pulsatile mass, MUSCULOSKELETAL: No deformity  SKIN: Warm and dry NEUROLOGIC:  Alert and oriented x 3 PSYCHIATRIC:  Normal affect   ASSESSMENT:    1. Essential hypertension   2. H/O amiodarone therapy   3. Permanent atrial fibrillation   4. Long term current use of anticoagulant therapy    PLAN:    In order of problems listed above:  1. Blood pressure is under excellent control.  C met will be done in 1 to 2 months to check kidney function. 2. Amiodarone has been discontinued because of thyroid abnormality. 3. Adequate rate control. 4. Lower dose anticoagulation therapy.  Patient is in chronic atrial fib with no complaints.  Clinical follow-up 9 to 12 months.  C met in 1 to 2 months.  Adjustment in diuretic/Diovan HCT dose if needed.   Medication Adjustments/Labs and Tests Ordered: Current medicines are reviewed at length with the patient today.  Concerns regarding medicines are outlined above.  No orders of the defined types were placed in this encounter.  No orders of the  defined types were placed in this encounter.   There are no Patient Instructions on file for this visit.   Signed, Sinclair Grooms, MD  01/02/2018 3:39 PM    [place]

## 2018-01-02 ENCOUNTER — Encounter (INDEPENDENT_AMBULATORY_CARE_PROVIDER_SITE_OTHER): Payer: Self-pay

## 2018-01-02 ENCOUNTER — Encounter: Payer: Self-pay | Admitting: Interventional Cardiology

## 2018-01-02 ENCOUNTER — Ambulatory Visit (INDEPENDENT_AMBULATORY_CARE_PROVIDER_SITE_OTHER): Payer: Medicare Other | Admitting: Interventional Cardiology

## 2018-01-02 VITALS — BP 114/78 | HR 87 | Ht 59.0 in | Wt 125.0 lb

## 2018-01-02 DIAGNOSIS — I10 Essential (primary) hypertension: Secondary | ICD-10-CM | POA: Diagnosis not present

## 2018-01-02 DIAGNOSIS — Z9229 Personal history of other drug therapy: Secondary | ICD-10-CM

## 2018-01-02 DIAGNOSIS — Z7901 Long term (current) use of anticoagulants: Secondary | ICD-10-CM | POA: Diagnosis not present

## 2018-01-02 DIAGNOSIS — I4821 Permanent atrial fibrillation: Secondary | ICD-10-CM

## 2018-01-02 NOTE — Patient Instructions (Signed)
Medication Instructions:  Your physician recommends that you continue on your current medications as directed. Please refer to the Current Medication list given to you today.  If you need a refill on your cardiac medications before your next appointment, please call your pharmacy.   Lab work: Your physician recommends that you return for lab work in: 2 months (BMET, Liver)  If you have labs (blood work) drawn today and your tests are completely normal, you will receive your results only by: Marland Kitchen MyChart Message (if you have MyChart) OR . A paper copy in the mail If you have any lab test that is abnormal or we need to change your treatment, we will call you to review the results.  Testing/Procedures: None  Follow-Up: At [facility], you and your health needs are our priority.  As part of our continuing mission to provide you with exceptional heart care, we have created designated Provider Care Teams.  These Care Teams include your primary Cardiologist (physician) and Advanced Practice Providers (APPs -  Physician Assistants and Nurse Practitioners) who all work together to provide you with the care you need, when you need it. You will need a follow up appointment in 9-12 months.  Please call our office 2 months in advance to schedule this appointment.  You may see Lesleigh Noe, MD or one of the following Advanced Practice Providers on your designated Care Team:   Norma Fredrickson, NP Nada Boozer, NP . Georgie Chard, NP  Any Other Special Instructions Will Be Listed Below (If Applicable).

## 2018-01-30 DIAGNOSIS — E0590 Thyrotoxicosis, unspecified without thyrotoxic crisis or storm: Secondary | ICD-10-CM | POA: Diagnosis not present

## 2018-03-06 ENCOUNTER — Other Ambulatory Visit: Payer: Medicare Other | Admitting: *Deleted

## 2018-03-06 DIAGNOSIS — I10 Essential (primary) hypertension: Secondary | ICD-10-CM | POA: Diagnosis not present

## 2018-03-07 LAB — BASIC METABOLIC PANEL
BUN / CREAT RATIO: 20 (ref 12–28)
BUN: 17 mg/dL (ref 8–27)
CALCIUM: 9.5 mg/dL (ref 8.7–10.3)
CO2: 24 mmol/L (ref 20–29)
CREATININE: 0.87 mg/dL (ref 0.57–1.00)
Chloride: 101 mmol/L (ref 96–106)
GFR, EST AFRICAN AMERICAN: 69 mL/min/{1.73_m2} (ref >59)
GFR, EST NON AFRICAN AMERICAN: 60 mL/min/{1.73_m2} (ref >59)
Glucose: 92 mg/dL (ref 65–99)
Potassium: 3.9 mmol/L (ref 3.5–5.2)
Sodium: 140 mmol/L (ref 134–144)

## 2018-03-07 LAB — HEPATIC FUNCTION PANEL
ALK PHOS: 93 IU/L (ref 39–117)
ALT: 19 IU/L (ref 0–32)
AST: 21 IU/L (ref 0–40)
Albumin: 3.4 g/dL — ABNORMAL LOW (ref 3.5–4.7)
BILIRUBIN TOTAL: 0.3 mg/dL (ref 0.0–1.2)
BILIRUBIN, DIRECT: 0.13 mg/dL (ref 0.00–0.40)
Total Protein: 6.7 g/dL (ref 6.0–8.5)

## 2018-03-13 DIAGNOSIS — E0580 Other thyrotoxicosis without thyrotoxic crisis or storm: Secondary | ICD-10-CM | POA: Diagnosis not present

## 2018-03-20 DIAGNOSIS — E0580 Other thyrotoxicosis without thyrotoxic crisis or storm: Secondary | ICD-10-CM | POA: Diagnosis not present

## 2018-03-20 DIAGNOSIS — E0520 Thyrotoxicosis with toxic multinodular goiter without thyrotoxic crisis or storm: Secondary | ICD-10-CM | POA: Diagnosis not present

## 2018-03-20 DIAGNOSIS — Z79899 Other long term (current) drug therapy: Secondary | ICD-10-CM | POA: Diagnosis not present

## 2018-03-30 ENCOUNTER — Other Ambulatory Visit: Payer: Self-pay | Admitting: Interventional Cardiology

## 2018-04-15 ENCOUNTER — Other Ambulatory Visit: Payer: Self-pay | Admitting: Interventional Cardiology

## 2018-05-08 ENCOUNTER — Other Ambulatory Visit: Payer: Self-pay | Admitting: Interventional Cardiology

## 2018-06-19 DIAGNOSIS — Z5181 Encounter for therapeutic drug level monitoring: Secondary | ICD-10-CM | POA: Diagnosis not present

## 2018-06-19 DIAGNOSIS — E0580 Other thyrotoxicosis without thyrotoxic crisis or storm: Secondary | ICD-10-CM | POA: Diagnosis not present

## 2018-06-19 DIAGNOSIS — E0520 Thyrotoxicosis with toxic multinodular goiter without thyrotoxic crisis or storm: Secondary | ICD-10-CM | POA: Diagnosis not present

## 2018-10-15 ENCOUNTER — Other Ambulatory Visit: Payer: Self-pay | Admitting: Interventional Cardiology

## 2018-10-15 NOTE — Telephone Encounter (Signed)
Prescription refill request for Xarelto received.   Last office visit: Daneen Schick (01-02-2018) Weight: 56.7 kg (01-02-2018) Age: 85 y.o. Scr: 0.87 (03-06-2018) CrCl: 39 ml/min  Prescription refill sent.

## 2018-10-26 DIAGNOSIS — Z79899 Other long term (current) drug therapy: Secondary | ICD-10-CM | POA: Diagnosis not present

## 2018-10-26 DIAGNOSIS — Z23 Encounter for immunization: Secondary | ICD-10-CM | POA: Diagnosis not present

## 2018-10-26 DIAGNOSIS — E0580 Other thyrotoxicosis without thyrotoxic crisis or storm: Secondary | ICD-10-CM | POA: Diagnosis not present

## 2018-10-26 DIAGNOSIS — E0520 Thyrotoxicosis with toxic multinodular goiter without thyrotoxic crisis or storm: Secondary | ICD-10-CM | POA: Diagnosis not present

## 2018-11-27 ENCOUNTER — Telehealth: Payer: Self-pay | Admitting: Interventional Cardiology

## 2018-11-27 NOTE — Telephone Encounter (Signed)
Attempted to contact pt. Line busy x 3.  [place] for daughter to come up with pt.

## 2018-11-27 NOTE — Telephone Encounter (Signed)
New Message:  Patient wanted to know if her daughter would be able to go into the clinic with her during her appointment on Tuesday 10-06. Please Advise

## 2018-11-30 NOTE — Progress Notes (Signed)
Cardiology Office Note:    Date:  12/01/2018   ID:  Mary Chaney, DOB [birth year], MRN [id number]  PCP:  Patient, No Pcp Per  Cardiologist:  Sinclair Grooms, MD   Referring MD: No ref. provider found   Chief Complaint  Patient presents with  . Congestive Heart Failure  . Atrial Fibrillation    History of Present Illness:    Mary Chaney is a 85 y.o. female with a hx of chronic diastolic heart failure,persistent atrial fibrillation on Xarelto, hypertension and toxic multinodular goiter.  She is doing well.  She has not taken her medications this morning.  Her legs swell during the day and go down at night.  She does not take her blood pressure medicines before she goes out because it causes her to feel dizzy.  She is not having shortness of breath, chest pain, bleeding, or neurological complaints.  Past Medical History:  Diagnosis Date  . Atrial fibrillation ([place])   . Hemorrhoids   . Hypertension   . Toxic multinodular goiter     History reviewed. No pertinent surgical history.  Current Medications: Current Meds  Medication Sig  . methimazole (TAPAZOLE) 10 MG tablet Take 5 mg by mouth daily.   . metoprolol succinate (TOPROL-XL) 25 MG 24 hr tablet TAKE 1 TABLET BY MOUTH EVERY DAY  . XARELTO 15 MG TABS tablet TAKE 1 TABLET (15 MG TOTAL) BY MOUTH DAILY WITH SUPPER.  . [DISCONTINUED] valsartan-hydrochlorothiazide (DIOVAN-HCT) 320-25 MG tablet TAKE 1 TABLET BY MOUTH DAILY.     Allergies:   Bee venom   Social History   Socioeconomic History  . Marital status: Widowed    Spouse name: Not on file  . Number of children: Not on file  . Years of education: Not on file  . Highest education level: Not on file  Occupational History  . Not on file  Social Needs  . Financial resource strain: Not on file  . Food insecurity    Worry: Not on file    Inability: Not on file  . Transportation needs    Medical: Not on file    Non-medical: Not on file  Tobacco Use  .  Smoking status: Former Smoker    Packs/day: 1.00    Quit date: 02/25/1983    Years since quitting: 35.7  . Smokeless tobacco: Never Used  Substance and Sexual Activity  . Alcohol use: No    Alcohol/week: 0.0 standard drinks  . Drug use: No  . Sexual activity: Not on file  Lifestyle  . Physical activity    Days per week: Not on file    Minutes per session: Not on file  . Stress: Not on file  Relationships  . Social Herbalist on phone: Not on file    Gets together: Not on file    Attends religious service: Not on file    Active member of club or organization: Not on file    Attends meetings of clubs or organizations: Not on file    Relationship status: Not on file  Other Topics Concern  . Not on file  Social History Narrative  . Not on file     Family History: The patient's family history includes Other in her father and mother.  ROS:   Please see the history of present illness.    Decreased memory.  All other systems reviewed and are negative.  EKGs/Labs/Other Studies Reviewed:    The following studies were reviewed today:  No new functional data  EKG:  EKG ATRIAL fibrillation with poor ventricular rate control at 104 bpm.  Vertical axis.  QS pattern V1 through V3.  Recent Labs: 03/06/2018: ALT 19; BUN 17; Creatinine, Ser 0.87; Potassium 3.9; Sodium 140  Recent Lipid Panel    Component Value Date/Time   CHOL  03/17/2008 0440    109        ATP III CLASSIFICATION:  <200     mg/dL   Desirable  200-239  mg/dL   Borderline High  >=240    mg/dL   High          TRIG 39 03/17/2008 0440   HDL 36 (L) 03/17/2008 0440   CHOLHDL 3.0 03/17/2008 0440   VLDL 8 03/17/2008 0440   LDLCALC  03/17/2008 0440    65        Total Cholesterol/HDL:CHD Risk Coronary Heart Disease Risk Table                     Men   Women  1/2 Average Risk   3.4   3.3  Average Risk       5.0   4.4  2 X Average Risk   9.6   7.1  3 X Average Risk  23.4   11.0        Use the calculated  Patient Ratio above and the CHD Risk Table to determine the patient's CHD Risk.        ATP III CLASSIFICATION (LDL):  <100     mg/dL   Optimal  100-129  mg/dL   Near or Above                    Optimal  130-159  mg/dL   Borderline  160-189  mg/dL   High  >190     mg/dL   Very High    Physical Exam:    VS:  BP 128/84   Pulse (!) 104   Ht _0  (1.499 m)   Wt 124 lb 9.6 oz (56.5 kg)   BMI 25.17 kg/m     Wt Readings from Last 3 Encounters:  12/01/18 124 lb 9.6 oz (56.5 kg)  01/02/18 125 lb (56.7 kg)  07/04/17 128 lb (58.1 kg)     GEN: Elderly but vigorous in appearance. No acute distress HEENT: Normal NECK: No JVD. LYMPHATICS: No lymphadenopathy CARDIAC: IIRR without murmur, gallop, but there is lichenification of the skin in both lower extremities with chronic stasis edema. VASCULAR:  Normal Pulses. No bruits. RESPIRATORY:  Clear to auscultation without rales, wheezing or rhonchi  ABDOMEN: Soft, non-tender, non-distended, No pulsatile mass, MUSCULOSKELETAL: No deformity  SKIN: Warm and dry NEUROLOGIC:  Alert and oriented x 3 PSYCHIATRIC:  Normal affect   ASSESSMENT:    1. Permanent atrial fibrillation ([place])   2. Long term current use of anticoagulant therapy   3. Essential hypertension   4. Chronic diastolic HF (heart failure) ([name])   5. Educated about COVID-19 virus infection    PLAN:    In order of problems listed above:  1. Heart rate control is poor.  Increase Toprol-XL to 37.5 mg daily. 2. No bleeding on Xarelto.  Will check hemoglobin in [redacted] weeks along with a C met. 3. Blood pressure control may be excessive.  Decrease valsartan HCT to 160 mg / 25 mg/day.  Basic metabolic panel in 2 weeks.  CBC in 2 weeks.  Follow-up visit with team member to reassess  blood pressure.  She needs to take her medications before coming for the office visit.  Trying to determine the impact of medication adjustments on the blood pressure and whether further adjustments are needed.  4. Stable with chronic lower extremity edema felt to be secondary more to venous insufficiency than heart failure. 5. The 3W's are discussed.   Medication Adjustments/Labs and Tests Ordered: Current medicines are reviewed at length with the patient today.  Concerns regarding medicines are outlined above.  No orders of the defined types were placed in this encounter.  No orders of the defined types were placed in this encounter.   There are no Patient Instructions on file for this visit.   Signed, Sinclair Grooms, MD  12/01/2018 8:30 AM    [place]

## 2018-11-30 NOTE — Telephone Encounter (Signed)
Spoke with pt and made her aware ok for daughter to come with her.

## 2018-12-01 ENCOUNTER — Encounter: Payer: Self-pay | Admitting: Interventional Cardiology

## 2018-12-01 ENCOUNTER — Encounter (INDEPENDENT_AMBULATORY_CARE_PROVIDER_SITE_OTHER): Payer: Self-pay

## 2018-12-01 ENCOUNTER — Ambulatory Visit (INDEPENDENT_AMBULATORY_CARE_PROVIDER_SITE_OTHER): Payer: Medicare Other | Admitting: Interventional Cardiology

## 2018-12-01 ENCOUNTER — Other Ambulatory Visit: Payer: Self-pay

## 2018-12-01 VITALS — BP 128/84 | HR 104 | Ht 59.0 in | Wt 124.6 lb

## 2018-12-01 DIAGNOSIS — I5032 Chronic diastolic (congestive) heart failure: Secondary | ICD-10-CM

## 2018-12-01 DIAGNOSIS — Z7189 Other specified counseling: Secondary | ICD-10-CM | POA: Diagnosis not present

## 2018-12-01 DIAGNOSIS — Z7901 Long term (current) use of anticoagulants: Secondary | ICD-10-CM

## 2018-12-01 DIAGNOSIS — I10 Essential (primary) hypertension: Secondary | ICD-10-CM

## 2018-12-01 DIAGNOSIS — I4821 Permanent atrial fibrillation: Secondary | ICD-10-CM | POA: Diagnosis not present

## 2018-12-01 MED ORDER — VALSARTAN-HYDROCHLOROTHIAZIDE 160-25 MG PO TABS: 1.0000 | ORAL_TABLET | Freq: Every day | ORAL | 3 refills | Status: DC

## 2018-12-01 MED ORDER — METOPROLOL SUCCINATE ER 25 MG PO TB24: 37.5000 mg | ORAL_TABLET | Freq: Every day | ORAL | 2 refills | Status: DC

## 2018-12-01 NOTE — Patient Instructions (Signed)
Medication Instructions:  1) DECREASE Valsartan/HCTZ to 160/25mg  once daily 2) INCREASE Metoprolol Succinate to 37.5mg  once daily (take 1.5 tablets)  If you need a refill on your cardiac medications before your next appointment, please call your pharmacy.   Lab work: You will need a BMET and CBC at your next visit.  If you have labs (blood work) drawn today and your tests are completely normal, you will receive your results only by: Marland Kitchen MyChart Message (if you have MyChart) OR . A paper copy in the mail If you have any lab test that is abnormal or we need to change your treatment, we will call you to review the results.  Testing/Procedures: None  Follow-Up:  Your physician recommends that you schedule a follow-up appointment in: 2-4 weeks with a PA or NP.  Please make sure you take your medication prior to coming to your appointment.   At [facility], you and your health needs are our priority.  As part of our continuing mission to provide you with exceptional heart care, we have created designated Provider Care Teams.  These Care Teams include your primary Cardiologist (physician) and Advanced Practice Providers (APPs -  Physician Assistants and Nurse Practitioners) who all work together to provide you with the care you need, when you need it. You will need a follow up appointment in 12 months.  Please call our office 2 months in advance to schedule this appointment.  You may see Sinclair Grooms, MD or one of the following Advanced Practice Providers on your designated Care Team:   Truitt Merle, NP Cecilie Kicks, NP . Kathyrn Drown, NP  Any Other Special Instructions Will Be Listed Below (If Applicable).

## 2018-12-17 ENCOUNTER — Ambulatory Visit: Payer: Medicare Other | Admitting: Cardiology

## 2018-12-17 ENCOUNTER — Ambulatory Visit (INDEPENDENT_AMBULATORY_CARE_PROVIDER_SITE_OTHER): Payer: Medicare Other | Admitting: Cardiology

## 2018-12-17 ENCOUNTER — Other Ambulatory Visit: Payer: Medicare Other | Admitting: *Deleted

## 2018-12-17 ENCOUNTER — Encounter: Payer: Self-pay | Admitting: Cardiology

## 2018-12-17 ENCOUNTER — Other Ambulatory Visit: Payer: Self-pay

## 2018-12-17 VITALS — BP 122/68 | HR 67 | Ht 59.0 in | Wt 123.6 lb

## 2018-12-17 DIAGNOSIS — I4821 Permanent atrial fibrillation: Secondary | ICD-10-CM | POA: Diagnosis not present

## 2018-12-17 DIAGNOSIS — I5032 Chronic diastolic (congestive) heart failure: Secondary | ICD-10-CM

## 2018-12-17 DIAGNOSIS — Z7901 Long term (current) use of anticoagulants: Secondary | ICD-10-CM | POA: Diagnosis not present

## 2018-12-17 DIAGNOSIS — I10 Essential (primary) hypertension: Secondary | ICD-10-CM

## 2018-12-17 NOTE — Patient Instructions (Signed)
Medication Instructions:   Your physician recommends that you continue on your current medications as directed. Please refer to the Current Medication list given to you today.  *If you need a refill on your cardiac medications before your next appointment, please call your pharmacy*  Lab Work:  You will have labs drawn today: BMET and CBC  If you have labs (blood work) drawn today and your tests are completely normal, you will receive your results only by: Marland Kitchen MyChart Message (if you have MyChart) OR . A paper copy in the mail If you have any lab test that is abnormal or we need to change your treatment, we will call you to review the results.  Testing/Procedures:  None ordered today  Follow-Up: At [facility], you and your health needs are our priority.  As part of our continuing mission to provide you with exceptional heart care, we have created designated Provider Care Teams.  These Care Teams include your primary Cardiologist (physician) and Advanced Practice Providers (APPs -  Physician Assistants and Nurse Practitioners) who all work together to provide you with the care you need, when you need it.  Your next appointment:   12 months  The format for your next appointment:   In Person  Provider:   You may see Sinclair Grooms, MD or one of the following Advanced Practice Providers on your designated Care Team:    Truitt Merle, NP  Cecilie Kicks, NP  Kathyrn Drown, NP

## 2018-12-17 NOTE — Progress Notes (Signed)
Cardiology Office Note   Date:  12/17/2018   ID:  Mary Chaney, DOB [birth year], MRN [id number]  PCP:  Patient, No Pcp Per  Cardiologist:  Dr. Katrinka Blazing    Chief Complaint  Patient presents with  . Tachycardia    with her persistent a fib.       History of Present Illness: Mary Chaney is a 85 y.o. female who presents for BP recheck after meds changed and HR check.     She has a hx of chronic diastolic heart failure,persistent atrial fibrillation on Xarelto, hypertension and toxic multinodular goiter.  On her last visit with Dr. Katrinka Blazing she was doing well.  She has not taken her medications this morning.  Her legs swell during the day and go down at night.  She does not take her blood pressure medicines before she goes out because it causes her to feel dizzy.    HR was poorly controlled and toprol increased to 37.5 mg daily. BP was too controlled valsartan HCT was decreased.    NEEDS CMET and CBC  Today her HR was elevated initially at 123 but this is with waking at 3:30A for early AM hair appt. And doing several errands today and while checking in another pt fell and lacerated his scalp.  She was a little stressed but once she relaxed her HR to 67.  She is most happy that with current meds she feels well after taking her meds and not like before on higher dose of diovan.  No angina and no SOB.  She is very careful with wearing mask and even cleans her own home first thing every AM.      .   Past Medical History:  Diagnosis Date  . Atrial fibrillation ([facility])   . Hemorrhoids   . Hypertension   . Toxic multinodular goiter     History reviewed. No pertinent surgical history.   Current Outpatient Medications  Medication Sig Dispense Refill  . methimazole (TAPAZOLE) 10 MG tablet Take 5 mg by mouth daily.     . metoprolol succinate (TOPROL-XL) 25 MG 24 hr tablet Take 1.5 tablets (37.5 mg total) by mouth daily. 135 tablet 2  . valsartan-hydrochlorothiazide (DIOVAN HCT)  160-25 MG tablet Take 1 tablet by mouth daily. 90 tablet 3  . XARELTO 15 MG TABS tablet TAKE 1 TABLET (15 MG TOTAL) BY MOUTH DAILY WITH SUPPER. 30 tablet 5   No current facility-administered medications for this visit.     Allergies:   Bee venom    Social History:  The patient  reports that she quit smoking about 35 years ago. She smoked 1.00 pack per day. She has never used smokeless tobacco. She reports that she does not drink alcohol or use drugs.   Family History:  The patient's family history includes Other in her father and mother.    ROS:  General:no colds or fevers, no weight changes Skin:no rashes or ulcers HEENT:no blurred vision, no congestion CV:see HPI PUL:see HPI GI:no diarrhea constipation or melena, no indigestion GU:no hematuria, no dysuria MS:no joint pain, no claudication Neuro:no syncope, no lightheadedness Endo:no diabetes, + thyroid disease followed by Dr. Sharl Ma with endo.  Wt Readings from Last 3 Encounters:  12/17/18 123 lb 9.6 oz (56.1 kg)  12/01/18 124 lb 9.6 oz (56.5 kg)  01/02/18 125 lb (56.7 kg)     PHYSICAL EXAM: VS:  BP 122/68   Pulse 67   Ht 4\' 11"  (1.499 m)   Wt 123  lb 9.6 oz (56.1 kg)   SpO2 98%   BMI 24.96 kg/m  , BMI Body mass index is 24.96 kg/m. General:Pleasant affect, NAD Skin:Warm and dry, brisk capillary refill HEENT:normocephalic, sclera clear, mucus membranes moist Neck:supple, no JVD, no bruits  Heart:irreg irreg without murmur, gallup, rub or click Lungs:clear without rales, rhonchi, or wheezes ZOX:WRUEAbd:soft, non tender, + BS, do not palpate liver spleen or masses Ext:no lower ext edema, 2+ pedal pulses, 2+ radial pulses Neuro:alert and oriented,X 3 MAE, follows commands, + facial symmetry    EKG:  EKG is NOT ordered today.    Recent Labs: 03/06/2018: ALT 19; BUN 17; Creatinine, Ser 0.87; Potassium 3.9; Sodium 140    Lipid Panel    Component Value Date/Time   CHOL  03/17/2008 0440    109        ATP III  CLASSIFICATION:  <200     mg/dL   Desirable  454-098200-239  mg/dL   Borderline High  >=119>=240    mg/dL   High          TRIG 39 03/17/2008 0440   HDL 36 (L) 03/17/2008 0440   CHOLHDL 3.0 03/17/2008 0440   VLDL 8 03/17/2008 0440   LDLCALC  03/17/2008 0440    65        Total Cholesterol/HDL:CHD Risk Coronary Heart Disease Risk Table                     Men   Women  1/2 Average Risk   3.4   3.3  Average Risk       5.0   4.4  2 X Average Risk   9.6   7.1  3 X Average Risk  23.4   11.0        Use the calculated Patient Ratio above and the CHD Risk Table to determine the patient's CHD Risk.        ATP III CLASSIFICATION (LDL):  <100     mg/dL   Optimal  [id number]  mg/dL   Near or Above                    Optimal  130-159  mg/dL   Borderline  562-130160-189  mg/dL   High  >865>190     mg/dL   Very High       Other studies Reviewed: Additional studies/ records that were reviewed today include: . ECHO 05/30/17 Study Conclusions  - Left ventricle: The cavity size was normal. Systolic function was   normal. The estimated ejection fraction was in the range of 55%   to 60%. Wall motion was normal; there were no regional wall   motion abnormalities. - Aortic valve: Valve mobility was restricted. Transvalvular   velocity was within the normal range. There was no stenosis.   There was no regurgitation. - Mitral valve: Calcified annulus. Transvalvular velocity was   within the normal range. There was no evidence for stenosis.   There was mild regurgitation. - Left atrium: The atrium was severely dilated. - Right ventricle: The cavity size was normal. Wall thickness was   normal. Systolic function was normal. - Right atrium: The atrium was severely dilated. - Atrial septum: No defect or patent foramen ovale was identified. - Tricuspid valve: There was moderate regurgitation. - Pulmonary arteries: Systolic pressure was mildly increased. PA   peak pressure: 46 mm Hg (S).   ASSESSMENT AND PLAN:  1.   Persistent/permanent a fib with poor HR  control, improved with increased dose of toprol XL to 37.5 daily.   Her HR was elevated but after stressful morning, she stated it does not do that normally.  HR came to 60s once relaxed.    2.  HTN controlled, she feels better on lower dose of Diovan for CBC and CMET today    3.  anticoagulation stable. No bleeding for CBC today.  Follow up with Dr. Tamala Julian in 12 months.    Current medicines are reviewed with the patient today.  The patient Has no concerns regarding medicines.  The following changes have been made:  See above Labs/ tests ordered today include:see above  Disposition:   FU:  see above  Signed, Cecilie Kicks, NP  12/17/2018 2:38 PM    [facility] [address], [place], [place], [place] Phone: [phone]; Fax: [phone]

## 2018-12-18 LAB — CBC
Hematocrit: 38.5 % (ref 34.0–46.6)
Hemoglobin: 12.5 g/dL (ref 11.1–15.9)
MCH: 27.8 pg (ref 26.6–33.0)
MCHC: 32.5 g/dL (ref 31.5–35.7)
MCV: 86 fL (ref 79–97)
Platelets: 189 10*3/uL (ref 150–450)
RBC: 4.5 x10E6/uL (ref 3.77–5.28)
RDW: 14.6 % (ref 11.7–15.4)
WBC: 4.3 10*3/uL (ref 3.4–10.8)

## 2018-12-18 LAB — BASIC METABOLIC PANEL
BUN/Creatinine Ratio: 32 — ABNORMAL HIGH (ref 12–28)
BUN: 22 mg/dL (ref 8–27)
CO2: 28 mmol/L (ref 20–29)
Calcium: 10.2 mg/dL (ref 8.7–10.3)
Chloride: 102 mmol/L (ref 96–106)
Creatinine, Ser: 0.69 mg/dL (ref 0.57–1.00)
GFR calc Af Amer: 89 mL/min/{1.73_m2} (ref >59)
GFR calc non Af Amer: 77 mL/min/{1.73_m2} (ref >59)
Glucose: 83 mg/dL (ref 65–99)
Potassium: 4 mmol/L (ref 3.5–5.2)
Sodium: 142 mmol/L (ref 134–144)

## 2019-01-28 DIAGNOSIS — E0580 Other thyrotoxicosis without thyrotoxic crisis or storm: Secondary | ICD-10-CM | POA: Diagnosis not present

## 2019-01-28 DIAGNOSIS — Z5181 Encounter for therapeutic drug level monitoring: Secondary | ICD-10-CM | POA: Diagnosis not present

## 2019-01-28 DIAGNOSIS — E0520 Thyrotoxicosis with toxic multinodular goiter without thyrotoxic crisis or storm: Secondary | ICD-10-CM | POA: Diagnosis not present

## 2019-04-27 DIAGNOSIS — E0520 Thyrotoxicosis with toxic multinodular goiter without thyrotoxic crisis or storm: Secondary | ICD-10-CM | POA: Diagnosis not present

## 2019-04-27 DIAGNOSIS — Z7189 Other specified counseling: Secondary | ICD-10-CM | POA: Diagnosis not present

## 2019-04-27 DIAGNOSIS — Z5181 Encounter for therapeutic drug level monitoring: Secondary | ICD-10-CM | POA: Diagnosis not present

## 2019-04-27 DIAGNOSIS — E0580 Other thyrotoxicosis without thyrotoxic crisis or storm: Secondary | ICD-10-CM | POA: Diagnosis not present

## 2019-05-01 ENCOUNTER — Other Ambulatory Visit: Payer: Self-pay | Admitting: Interventional Cardiology

## 2019-05-03 NOTE — Telephone Encounter (Signed)
Prescription refill request for Xarelto received.   Last office visit: Mary Chaney 12/17/2018 Weight: 56.1 kg Age: 86 y.o. Scr: 0.69 12/17/2018 CrCl: 47.99 ml/min   Prescription refill sent.

## 2019-06-30 ENCOUNTER — Encounter (HOSPITAL_BASED_OUTPATIENT_CLINIC_OR_DEPARTMENT_OTHER): Payer: Medicare Other | Attending: Physician Assistant | Admitting: Physician Assistant

## 2019-06-30 DIAGNOSIS — Z87891 Personal history of nicotine dependence: Secondary | ICD-10-CM | POA: Insufficient documentation

## 2019-06-30 DIAGNOSIS — I480 Paroxysmal atrial fibrillation: Secondary | ICD-10-CM | POA: Diagnosis not present

## 2019-06-30 DIAGNOSIS — I110 Hypertensive heart disease with heart failure: Secondary | ICD-10-CM | POA: Insufficient documentation

## 2019-06-30 DIAGNOSIS — Z8249 Family history of ischemic heart disease and other diseases of the circulatory system: Secondary | ICD-10-CM | POA: Insufficient documentation

## 2019-06-30 DIAGNOSIS — Z7901 Long term (current) use of anticoagulants: Secondary | ICD-10-CM | POA: Diagnosis not present

## 2019-06-30 DIAGNOSIS — L97812 Non-pressure chronic ulcer of other part of right lower leg with fat layer exposed: Secondary | ICD-10-CM | POA: Diagnosis not present

## 2019-06-30 DIAGNOSIS — I872 Venous insufficiency (chronic) (peripheral): Secondary | ICD-10-CM | POA: Insufficient documentation

## 2019-06-30 DIAGNOSIS — I890 Lymphedema, not elsewhere classified: Secondary | ICD-10-CM | POA: Diagnosis not present

## 2019-06-30 DIAGNOSIS — I5042 Chronic combined systolic (congestive) and diastolic (congestive) heart failure: Secondary | ICD-10-CM | POA: Diagnosis not present

## 2019-07-01 NOTE — Progress Notes (Signed)
SHENAY, TORTI ([id number]) Visit Report for 06/30/2019 Chief Complaint Document Details Patient Name: Date of Service: Mary Chaney, Mary Chaney 06/30/2019 10:30 A M Medical Record Number: [id number] Patient Account Number: [id number] Date of Birth/Sex: Treating RN: 12/10/1927 (86 y.o. Female) Mary Chaney Primary Care Provider: PA Zenovia Jordan, [place] Other Clinician: Referring Provider: Treating Provider/Extender: Skeet Simmer in Treatment: 0 Information Obtained from: Patient Chief Complaint Right LE Ulcer Electronic Signature(s) Signed: 06/30/2019 12:12:43 PM By: Lenda Kelp PA-C Entered By: Lenda Kelp on 06/30/2019 12:12:43 -------------------------------------------------------------------------------- Debridement Details Patient Name: Date of Service: Mary Seat E. 06/30/2019 10:30 A M Medical Record Number: [id number] Patient Account Number: [id number] Date of Birth/Sex: Treating RN: 1927-12-31 (86 y.o. Female) Mary Chaney Primary Care Provider: PA Zenovia Jordan, [place] Other Clinician: Referring Provider: Treating Provider/Extender: Skeet Simmer in Treatment: 0 Debridement Performed for Assessment: Wound #1 Right,Lateral Lower Leg Performed By: Physician Lenda Kelp, PA Debridement Type: Debridement Severity of Tissue Pre Debridement: Fat layer exposed Level of Consciousness (Pre-procedure): Awake and Alert Pre-procedure Verification/Time Out Yes - 12:20 Taken: Start Time: 12:22 Pain Control: Other : benzocaine 20% spray T Area Debrided (L x W): otal 2 (cm) x 2 (cm) = 4 (cm) Tissue and other material debrided: Viable, Non-Viable, Slough, Subcutaneous, Slough Level: Skin/Subcutaneous Tissue Debridement Description: Excisional Instrument: Curette Bleeding: Minimum Hemostasis Achieved: Pressure End Time: 12:24 Procedural Pain: 7 Post Procedural Pain: 3 Response to Treatment: Procedure was tolerated well Level of Consciousness (Post- Awake and  Alert procedure): Post Debridement Measurements of Total Wound Length: (cm) 2 Width: (cm) 2 Depth: (cm) 0.2 Volume: (cm) 0.628 Character of Wound/Ulcer Post Debridement: Improved Severity of Tissue Post Debridement: Fat layer exposed Post Procedure Diagnosis Same as Pre-procedure Electronic Signature(s) Signed: 06/30/2019 5:03:53 PM By: Mary Deed RN, BSN Signed: 06/30/2019 6:28:26 PM By: Lenda Kelp PA-C Entered By: Mary Chaney on 06/30/2019 12:26:58 -------------------------------------------------------------------------------- HPI Details Patient Name: Date of Service: Mary Seat E. 06/30/2019 10:30 A M Medical Record Number: [id number] Patient Account Number: [id number] Date of Birth/Sex: Treating RN: 06/08/1927 (86 y.o. Female) Mary Chaney Primary Care Provider: PA Zenovia Jordan, [place] Other Clinician: Referring Provider: Treating Provider/Extender: Skeet Simmer in Treatment: 0 History of Present Illness HPI Description: 06/30/2019 upon evaluation today patient presents for initial inspection here in our clinic concerning issues that she has been having with a ulcer on her right lateral lower extremity. She tells me that this has been present for about a month. She notes it came up initially as a blister that just showed up out of nowhere and then subsequently has worsened from that point. She has a lot of swelling and history of venous insufficiency. She also has what appears to be mild lymphedema and hemosiderin staining. The patient also has atrial fibrillation, is on long-term anticoagulant therapy, has hypertension, and congestive heart failure. Currently she tells me that the wound is giving her some trouble as far as pain is concerned but fortunately does not appear to be extremely painful which is good news. No fevers, chills, nausea, vomiting, or diarrhea. She has in the past worn compression socks but tells me its been about a year since she did. They are  very difficult to get on she tells me. Electronic Signature(s) Signed: 06/30/2019 5:34:26 PM By: Lenda Kelp PA-C Entered By: Lenda Kelp on 06/30/2019 17:34:26 -------------------------------------------------------------------------------- Physical Exam Details Patient Name: Date of Service: Mary Chaney, Mary Chaney 06/30/2019 10:30 A M Medical Record Number: [id number] Patient Account  Number: [id number] Date of Birth/Sex: Treating RN: 1927-11-01 (86 y.o. Female) Baruch Gouty Primary Care Provider: PA Haig Prophet, [place] Other Clinician: Referring Provider: Treating Provider/Extender: Sharalyn Ink in Treatment: 0 Constitutional sitting or standing blood pressure is within target range for patient.. pulse regular and within target range for patient.Marland Kitchen respirations regular, non-labored and within target range for patient.Marland Kitchen temperature within target range for patient.. Well-nourished and well-hydrated in no acute distress. Eyes conjunctiva clear no eyelid edema noted. pupils equal round and reactive to light and accommodation. Ears, Nose, Mouth, and Throat no gross abnormality of ear auricles or external auditory canals. normal hearing noted during conversation. mucus membranes moist. Respiratory normal breathing without difficulty. Cardiovascular 2+ dorsalis pedis/posterior tibialis pulses. 1+ pitting edema of the bilateral lower extremities. Musculoskeletal normal gait and posture. no significant deformity or arthritic changes, no loss or range of motion, no clubbing. Psychiatric this patient is able to make decisions and demonstrates good insight into disease process. Alert and Oriented x 3. pleasant and cooperative. Notes Upon inspection patient's wounds currently showed signs of being a rather superficial but slough covered ulceration on the lateral portion of the right leg. Fortunately there does not appear to be any signs of active infection which is good news. This is going  require some sharp debridement to clear away necrotic tissue at this time. The patient is in agreement with going forward with the debridement though she was not extremely happy about this. Nonetheless I think that it is appropriate. Also think she is good in the to have some compression by way of a wrap in order to get the swelling under control and allow this to heal more effectively. Electronic Signature(s) Signed: 06/30/2019 5:35:12 PM By: Worthy Keeler PA-C Entered By: Worthy Keeler on 06/30/2019 17:35:12 -------------------------------------------------------------------------------- Physician Orders Details Patient Name: Date of Service: Mary Alm E. 06/30/2019 10:30 A M Medical Record Number: [id number] Patient Account Number: [id number] Date of Birth/Sex: Treating RN: December 11, 1927 (86 y.o. Female) Baruch Gouty Primary Care Provider: PA Haig Prophet, [place] Other Clinician: Referring Provider: Treating Provider/Extender: Sharalyn Ink in Treatment: 0 Verbal / Phone Orders: No Diagnosis Coding ICD-10 Coding Code Description I87.2 Venous insufficiency (chronic) (peripheral) I89.0 Lymphedema, not elsewhere classified L97.812 Non-pressure chronic ulcer of other part of right lower leg with fat layer exposed I48.0 Paroxysmal atrial fibrillation I50.42 Chronic combined systolic (congestive) and diastolic (congestive) heart failure I10 Essential (primary) hypertension Z79.01 Long term (current) use of anticoagulants Follow-up Appointments Return Appointment in 1 week. Dressing Change Frequency Wound #1 Right,Lateral Lower Leg Do not change entire dressing for one week. Skin Barriers/Peri-Wound Care Moisturizing lotion - to leg Wound Cleansing Wound #1 Right,Lateral Lower Leg May shower with protection. Primary Wound Dressing Wound #1 Right,Lateral Lower Leg Hydrofera Blue - classic Secondary Dressing Wound #1 Right,Lateral Lower Leg Dry Gauze Edema Control 3 Layer  Compression System - Right Lower Extremity Avoid standing for long periods of time Elevate legs to the level of the heart or above for 30 minutes daily and/or when sitting, a frequency of: - throughout the day Exercise regularly Support Garment 20-30 mm/Hg pressure to: - compression stocking to left leg daily Electronic Signature(s) Signed: 06/30/2019 5:03:53 PM By: Baruch Gouty RN, BSN Signed: 06/30/2019 6:28:26 PM By: Worthy Keeler PA-C Entered By: Baruch Gouty on 06/30/2019 12:25:00 -------------------------------------------------------------------------------- Problem List Details Patient Name: Date of Service: Mary Alm E. 06/30/2019 10:30 A M Medical Record Number: [id number] Patient Account Number: [id number] Date of Birth/Sex: Treating RN:  Sep 08, 1927 (86 y.o. Female) Mary Chaney Primary Care Provider: PA Zenovia Jordan, [place] Other Clinician: Referring Provider: Treating Provider/Extender: Skeet Simmer in Treatment: 0 Active Problems ICD-10 Encounter Code Description Active Date MDM Diagnosis I87.2 Venous insufficiency (chronic) (peripheral) 06/30/2019 No Yes I89.0 Lymphedema, not elsewhere classified 06/30/2019 No Yes L97.812 Non-pressure chronic ulcer of other part of right lower leg with fat layer 06/30/2019 No Yes exposed I48.0 Paroxysmal atrial fibrillation 06/30/2019 No Yes I50.42 Chronic combined systolic (congestive) and diastolic (congestive) heart failure 06/30/2019 No Yes I10 Essential (primary) hypertension 06/30/2019 No Yes Z79.01 Long term (current) use of anticoagulants 06/30/2019 No Yes Inactive Problems Resolved Problems Electronic Signature(s) Signed: 06/30/2019 12:12:26 PM By: Lenda Kelp PA-C Entered By: Lenda Kelp on 06/30/2019 12:12:26 -------------------------------------------------------------------------------- Progress Note Details Patient Name: Date of Service: Mary Seat E. 06/30/2019 10:30 A M Medical Record Number:  [id number] Patient Account Number: [id number] Date of Birth/Sex: Treating RN: April 08, 1927 (86 y.o. Female) Mary Chaney Primary Care Provider: PA Zenovia Jordan, [place] Other Clinician: Referring Provider: Treating Provider/Extender: Skeet Simmer in Treatment: 0 Subjective Chief Complaint Information obtained from Patient Right LE Ulcer History of Present Illness (HPI) 06/30/2019 upon evaluation today patient presents for initial inspection here in our clinic concerning issues that she has been having with a ulcer on her right lateral lower extremity. She tells me that this has been present for about a month. She notes it came up initially as a blister that just showed up out of nowhere and then subsequently has worsened from that point. She has a lot of swelling and history of venous insufficiency. She also has what appears to be mild lymphedema and hemosiderin staining. The patient also has atrial fibrillation, is on long-term anticoagulant therapy, has hypertension, and congestive heart failure. Currently she tells me that the wound is giving her some trouble as far as pain is concerned but fortunately does not appear to be extremely painful which is good news. No fevers, chills, nausea, vomiting, or diarrhea. She has in the past worn compression socks but tells me its been about a year since she did. They are very difficult to get on she tells me. Patient History Information obtained from Patient. Allergies bee venom protein (honey bee) (Reaction: swelling) Family History Heart Disease - Mother,Father, Hypertension - Mother,Father,Child, No family history of Cancer, Diabetes, Hereditary Spherocytosis, Kidney Disease, Lung Disease, Seizures, Stroke, Thyroid Problems, Tuberculosis. Social History Former smoker - quit over 30 years ago, Marital Status - Widowed, Alcohol Use - Never, Drug Use - No History, Caffeine Use - Rarely. Medical History Cardiovascular Patient has history of Arrhythmia  - A-Fib, Congestive Heart Failure, Hypertension Medical A Surgical History Notes nd Endocrine T multinodular goiter oxic Review of Systems (ROS) Constitutional Symptoms (General Health) Denies complaints or symptoms of Fatigue, Fever, Chills, Marked Weight Change. Eyes Denies complaints or symptoms of Dry Eyes, Vision Changes, Glasses / Contacts. Ear/Nose/Mouth/Throat Denies complaints or symptoms of Chronic sinus problems or rhinitis. Respiratory Denies complaints or symptoms of Chronic or frequent coughs, Shortness of Breath. Gastrointestinal Denies complaints or symptoms of Frequent diarrhea, Nausea, Vomiting. Endocrine Denies complaints or symptoms of Heat/cold intolerance. Genitourinary Denies complaints or symptoms of Frequent urination. Integumentary (Skin) Complains or has symptoms of Wounds - wound on right lower leg. Musculoskeletal Denies complaints or symptoms of Muscle Pain, Muscle Weakness. Neurologic Denies complaints or symptoms of Numbness/parasthesias. Psychiatric Denies complaints or symptoms of Claustrophobia, Suicidal. Objective Constitutional sitting or standing blood pressure is within target range for patient.. pulse  regular and within target range for patient.Marland Kitchen respirations regular, non-labored and within target range for patient.Marland Kitchen temperature within target range for patient.. Well-nourished and well-hydrated in no acute distress. Vitals Time Taken: 11:21 AM, Height: 60 in, Source: Stated, Weight: 125 lbs, Source: Stated, BMI: 24.4, Temperature: 97.7 F, Pulse: 79 bpm, Respiratory Rate: 16 breaths/min, Blood Pressure: 137/85 mmHg. Eyes conjunctiva clear no eyelid edema noted. pupils equal round and reactive to light and accommodation. Ears, Nose, Mouth, and Throat no gross abnormality of ear auricles or external auditory canals. normal hearing noted during conversation. mucus membranes moist. Respiratory normal breathing without  difficulty. Cardiovascular 2+ dorsalis pedis/posterior tibialis pulses. 1+ pitting edema of the bilateral lower extremities. Musculoskeletal normal gait and posture. no significant deformity or arthritic changes, no loss or range of motion, no clubbing. Psychiatric this patient is able to make decisions and demonstrates good insight into disease process. Alert and Oriented x 3. pleasant and cooperative. General Notes: Upon inspection patient's wounds currently showed signs of being a rather superficial but slough covered ulceration on the lateral portion of the right leg. Fortunately there does not appear to be any signs of active infection which is good news. This is going require some sharp debridement to clear away necrotic tissue at this time. The patient is in agreement with going forward with the debridement though she was not extremely happy about this. Nonetheless I think that it is appropriate. Also think she is good in the to have some compression by way of a wrap in order to get the swelling under control and allow this to heal more effectively. Integumentary (Hair, Skin) Wound #1 status is Open. Original cause of wound was Gradually Appeared. The wound is located on the Right,Lateral Lower Leg. The wound measures 2cm length x 2cm width x 0.2cm depth; 3.142cm^2 area and 0.628cm^3 volume. There is Fat Layer (Subcutaneous Tissue) Exposed exposed. There is no tunneling or undermining noted. There is a medium amount of serosanguineous drainage noted. The wound margin is flat and intact. There is medium (34-66%) pink granulation within the wound bed. There is a medium (34-66%) amount of necrotic tissue within the wound bed including Adherent Slough. Assessment Active Problems ICD-10 Venous insufficiency (chronic) (peripheral) Lymphedema, not elsewhere classified Non-pressure chronic ulcer of other part of right lower leg with fat layer exposed Paroxysmal atrial fibrillation Chronic  combined systolic (congestive) and diastolic (congestive) heart failure Essential (primary) hypertension Long term (current) use of anticoagulants Procedures Wound #1 Pre-procedure diagnosis of Wound #1 is a Venous Leg Ulcer located on the Right,Lateral Lower Leg .Severity of Tissue Pre Debridement is: Fat layer exposed. There was a Excisional Skin/Subcutaneous Tissue Debridement with a total area of 4 sq cm performed by Lenda Kelp, PA. With the following instrument(s): Curette to remove Viable and Non-Viable tissue/material. Material removed includes Subcutaneous Tissue and Slough and after achieving pain control using Other (benzocaine 20% spray). No specimens were taken. A time out was conducted at 12:20, prior to the start of the procedure. A Minimum amount of bleeding was controlled with Pressure. The procedure was tolerated well with a pain level of 7 throughout and a pain level of 3 following the procedure. Post Debridement Measurements: 2cm length x 2cm width x 0.2cm depth; 0.628cm^3 volume. Character of Wound/Ulcer Post Debridement is improved. Severity of Tissue Post Debridement is: Fat layer exposed. Post procedure Diagnosis Wound #1: Same as Pre-Procedure Pre-procedure diagnosis of Wound #1 is a Venous Leg Ulcer located on the Right,Lateral Lower Leg . There  was a Three Layer Compression Therapy Procedure by Yevonne Pax, RN. Post procedure Diagnosis Wound #1: Same as Pre-Procedure Plan Follow-up Appointments: Return Appointment in 1 week. Dressing Change Frequency: Wound #1 Right,Lateral Lower Leg: Do not change entire dressing for one week. Skin Barriers/Peri-Wound Care: Moisturizing lotion - to leg Wound Cleansing: Wound #1 Right,Lateral Lower Leg: May shower with protection. Primary Wound Dressing: Wound #1 Right,Lateral Lower Leg: Hydrofera Blue - classic Secondary Dressing: Wound #1 Right,Lateral Lower Leg: Dry Gauze Edema Control: 3 Layer Compression System -  Right Lower Extremity Avoid standing for long periods of time Elevate legs to the level of the heart or above for 30 minutes daily and/or when sitting, a frequency of: - throughout the day Exercise regularly Support Garment 20-30 mm/Hg pressure to: - compression stocking to left leg daily 1. I am going to suggest currently that we go ahead and initiate treatment with [facility] which I think will be beneficial for the patient. She is in agreement with the plan. 2. I am also can recommend that she have a 3 layer compression wrap to the right lower extremity. I think that she needs to try to keep as much swelling as possible out of the leg to help this to heal. 3. With regard to her left lower extremity I do recommend that she use her compression stocking which she has at home. She should really be wearing these every day from the time she gets up in the morning to when she goes to bed at night. We will see patient back for reevaluation in 1 week here in the clinic. If anything worsens or changes patient will contact our office for additional recommendations. Electronic Signature(s) Signed: 06/30/2019 5:36:04 PM By: Lenda Kelp PA-C Entered By: Lenda Kelp on 06/30/2019 17:36:04 -------------------------------------------------------------------------------- HxROS Details Patient Name: Date of Service: Mary Seat E. 06/30/2019 10:30 A M Medical Record Number: [id number] Patient Account Number: [id number] Date of Birth/Sex: Treating RN: September 12, 1927 (86 y.o. Female) Mary Chaney Primary Care Provider: PA Zenovia Jordan, [place] Other Clinician: Referring Provider: Treating Provider/Extender: Skeet Simmer in Treatment: 0 Information Obtained From Patient Constitutional Symptoms (General Health) Complaints and Symptoms: Negative for: Fatigue; Fever; Chills; Marked Weight Change Eyes Complaints and Symptoms: Negative for: Dry Eyes; Vision Changes; Glasses /  Contacts Ear/Nose/Mouth/Throat Complaints and Symptoms: Negative for: Chronic sinus problems or rhinitis Respiratory Complaints and Symptoms: Negative for: Chronic or frequent coughs; Shortness of Breath Gastrointestinal Complaints and Symptoms: Negative for: Frequent diarrhea; Nausea; Vomiting Endocrine Complaints and Symptoms: Negative for: Heat/cold intolerance Medical History: Past Medical History Notes: T multinodular goiter oxic Genitourinary Complaints and Symptoms: Negative for: Frequent urination Integumentary (Skin) Complaints and Symptoms: Positive for: Wounds - wound on right lower leg Musculoskeletal Complaints and Symptoms: Negative for: Muscle Pain; Muscle Weakness Neurologic Complaints and Symptoms: Negative for: Numbness/parasthesias Psychiatric Complaints and Symptoms: Negative for: Claustrophobia; Suicidal Hematologic/Lymphatic Cardiovascular Medical History: Positive for: Arrhythmia - A-Fib; Congestive Heart Failure; Hypertension Immunological Oncologic Immunizations Pneumococcal Vaccine: Received Pneumococcal Vaccination: Yes Implantable Devices None Family and Social History Cancer: No; Diabetes: No; Heart Disease: Yes - Mother,Father; Hereditary Spherocytosis: No; Hypertension: Yes - Mother,Father,Child; Kidney Disease: No; Lung Disease: No; Seizures: No; Stroke: No; Thyroid Problems: No; Tuberculosis: No; Former smoker - quit over 30 years ago; Marital Status - Widowed; Alcohol Use: Never; Drug Use: No History; Caffeine Use: Rarely; Financial Concerns: No; Food, Clothing or Shelter Needs: No; Support System Lacking: No; Transportation Concerns: No Electronic Signature(s) Signed: 06/30/2019 6:28:26 PM By: Larina Bras  Eldridge ScotII, Mary Vultaggio PA-C Signed: 07/01/2019 5:16:23 PM By: Mary AbtsLynch, Shatara RN, BSN Entered By: Mary AbtsLynch, Shatara on 06/30/2019 11:41:54 -------------------------------------------------------------------------------- SuperBill Details Patient  Name: Date of Service: Mary Chaney, Mary NNIE E. 06/30/2019 Medical Record Number: [id number] Patient Account Number: [id number] Date of Birth/Sex: Treating RN: 1927-03-29 (86 y.o. Female) Mary DeedBoehlein, Linda Primary Care Provider: PA Zenovia JordanIENT, NO Other Clinician: Referring Provider: Treating Provider/Extender: Skeet SimmerStone III, Laval Cafaro Weeks in Treatment: 0 Diagnosis Coding ICD-10 Codes Code Description I87.2 Venous insufficiency (chronic) (peripheral) I89.0 Lymphedema, not elsewhere classified L97.812 Non-pressure chronic ulcer of other part of right lower leg with fat layer exposed I48.0 Paroxysmal atrial fibrillation I50.42 Chronic combined systolic (congestive) and diastolic (congestive) heart failure I10 Essential (primary) hypertension Z79.01 Long term (current) use of anticoagulants Facility Procedures CPT4 Code: [id number] Description: 99213 - WOUND CARE VISIT-LEV 3 EST PT Modifier: 25 Quantity: 1 CPT4 Code: [id number] Description: 11042 - DEB SUBQ TISSUE 20 SQ CM/< ICD-10 Diagnosis Description L97.812 Non-pressure chronic ulcer of other part of right lower leg with fat layer expos Modifier: ed Quantity: 1 Physician Procedures : CPT4 Code Description Modifier [id number] WC PHYS LEVEL 3 NEW PT 25 ICD-10 Diagnosis Description I87.2 Venous insufficiency (chronic) (peripheral) I89.0 Lymphedema, not elsewhere classified L97.812 Non-pressure chronic ulcer of other part of right lower  leg with fat layer exposed I48.0 Paroxysmal atrial fibrillation Quantity: 1 : [id number] 11042 - WC PHYS SUBQ TISS 20 SQ CM ICD-10 Diagnosis Description L97.812 Non-pressure chronic ulcer of other part of right lower leg with fat layer exposed Quantity: 1 Electronic Signature(s) Signed: 06/30/2019 5:36:30 PM By: Lenda KelpStone III, Amyria Komar PA-C Previous Signature: 06/30/2019 5:03:53 PM Version By: Mary DeedBoehlein, Linda RN, BSN Entered By: Lenda KelpStone III, Aviannah Castoro on 06/30/2019 17:36:30

## 2019-07-01 NOTE — Progress Notes (Signed)
RANEEN, JAFFER ([id number]) Visit Report for 06/30/2019 Allergy List Details Patient Name: Date of Service: OCEANNA, ARRUDA 06/30/2019 10:30 A M Medical Record Number: [id number] Patient Account Number: [id number] Date of Birth/Sex: Treating RN: 1927-02-28 (86 y.o. Female) Zandra Abts Primary Care Zenna Traister: PA Zenovia Jordan, NO Other Clinician: Referring Adorian Gwynne: Treating Everlynn Sagun/Extender: Skeet Simmer in Treatment: 0 Allergies Active Allergies bee venom protein (honey bee) Reaction: swelling Allergy Notes Electronic Signature(s) Signed: 07/01/2019 5:16:23 PM By: Zandra Abts RN, BSN Entered By: Zandra Abts on 06/30/2019 11:23:03 -------------------------------------------------------------------------------- Arrival Information Details Patient Name: Date of Service: [name] 06/30/2019 10:30 A M Medical Record Number: [id number] Patient Account Number: [id number] Date of Birth/Sex: Treating RN: 03-11-1927 (86 y.o. Female) Zandra Abts Primary Care Matrice Herro: PA Zenovia Jordan, NO Other Clinician: Referring Shizue Kaseman: Treating Mixtli Reno/Extender: Skeet Simmer in Treatment: 0 Visit Information Patient Arrived: Cane Arrival Time: 11:15 Accompanied By: daughter Transfer Assistance: None Patient Identification Verified: Yes Secondary Verification Process Completed: Yes Patient Requires Transmission-Based Precautions: No Patient Has Alerts: Yes Patient Alerts: Patient on Blood Thinner Electronic Signature(s) Signed: 07/01/2019 5:16:23 PM By: Zandra Abts RN, BSN Entered By: Zandra Abts on 06/30/2019 11:21:02 -------------------------------------------------------------------------------- Clinic Level of Care Assessment Details Patient Name: Date of Service: RHONNA, HOLSTER E. 06/30/2019 10:30 A M Medical Record Number: [id number] Patient Account Number: [id number] Date of Birth/Sex: Treating RN: May 02, 1927 (86 y.o. Female) Zenaida Deed Primary Care  Lash Matulich: PA Zenovia Jordan, NO Other Clinician: Referring Dalal Livengood: Treating Caira Poche/Extender: Skeet Simmer in Treatment: 0 Clinic Level of Care Assessment Items TOOL 1 Quantity Score []  - 0 Use when EandM and Procedure is performed on INITIAL visit ASSESSMENTS - Nursing Assessment / Reassessment X- 1 20 General Physical Exam (combine w/ comprehensive assessment (listed just below) when performed on new pt. evals) X- 1 25 Comprehensive Assessment (HX, ROS, Risk Assessments, Wounds Hx, etc.) ASSESSMENTS - Wound and Skin Assessment / Reassessment []  - 0 Dermatologic / Skin Assessment (not related to wound area) ASSESSMENTS - Ostomy and/or Continence Assessment and Care []  - 0 Incontinence Assessment and Management []  - 0 Ostomy Care Assessment and Management (repouching, etc.) PROCESS - Coordination of Care X - Simple Patient / Family Education for ongoing care 1 15 []  - 0 Complex (extensive) Patient / Family Education for ongoing care X- 1 10 Staff obtains , Records, T Results / Process Orders est []  - 0 Staff telephones HHA, Nursing Homes / Clarify orders / etc []  - 0 Routine Transfer to another Facility (non-emergent condition) []  - 0 Routine Hospital Admission (non-emergent condition) X- 1 15 New Admissions / / Ordering NPWT Apligraf, etc. , []  - 0 Emergency Hospital Admission (emergent condition) PROCESS - Special Needs []  - 0 Pediatric / Minor Patient Management []  - 0 Isolation Patient Management []  - 0 Hearing / Language / Visual special needs []  - 0 Assessment of Community assistance (transportation, D/C planning, etc.) []  - 0 Additional assistance / Altered mentation []  - 0 Support Surface(s) Assessment (bed, cushion, seat, etc.) INTERVENTIONS - Miscellaneous []  - 0 External ear exam []  - 0 Patient Transfer (multiple staff / / Similar devices) []  - 0 Simple Staple / Suture removal (25 or less) []  -  0 Complex Staple / Suture removal (26 or more) []  - 0 Hypo/Hyperglycemic Management (do not check if billed separately) X- 1 15 Ankle / Brachial Index (ABI) - do not check if billed separately Has the patient been seen at the hospital within the  last three years: Yes Total Score: 100 Level Of Care: New/Established - Level 3 Electronic Signature(s) Signed: 06/30/2019 5:03:53 PM By: Zenaida Deed RN, BSN Entered By: Zenaida Deed on 06/30/2019 12:19:16 -------------------------------------------------------------------------------- Compression Therapy Details Patient Name: Date of Service: [name] 06/30/2019 10:30 A M Medical Record Number: [id number] Patient Account Number: [id number] Date of Birth/Sex: Treating RN: 11/25/1927 (86 y.o. Female) Zenaida Deed Primary Care Cassandra Harbold: PA Zenovia Jordan, [place] Other Clinician: Referring Javonn Gauger: Treating Deral Schellenberg/Extender: Skeet Simmer in Treatment: 0 Compression Therapy Performed for Wound Assessment: Wound #1 Right,Lateral Lower Leg Performed By: Clinician Yevonne Pax, RN Compression Type: Three Layer Post Procedure Diagnosis Same as Pre-procedure Electronic Signature(s) Signed: 06/30/2019 5:03:53 PM By: Zenaida Deed RN, BSN Entered By: Zenaida Deed on 06/30/2019 12:25:25 -------------------------------------------------------------------------------- Lower Extremity Assessment Details Patient Name: Date of Service: [name] 06/30/2019 10:30 A M Medical Record Number: [id number] Patient Account Number: [id number] Date of Birth/Sex: Treating RN: 1927-12-19 (86 y.o. Female) Zandra Abts Primary Care Aurelie Dicenzo: PA TIENT, NO Other Clinician: Referring Zahriyah Joo: Treating Geryl Dohn/Extender: Skeet Simmer in Treatment: 0 Edema Assessment Assessed: [Left: No] [Right: No] Edema: [Left: Ye] [Right: s] Calf Left: Right: Point of Measurement: 28 cm From Medial Instep cm 37 cm Ankle Left:  Right: Point of Measurement: 11 cm From Medial Instep cm 20.2 cm Vascular Assessment Pulses: Dorsalis Pedis Palpable: [Right:Yes] Blood Pressure: Brachial: [Right:137] Ankle: [Right:Dorsalis Pedis: 160 1.17] Electronic Signature(s) Signed: 07/01/2019 5:16:23 PM By: Zandra Abts RN, BSN Entered By: Zandra Abts on 06/30/2019 11:53:06 -------------------------------------------------------------------------------- Multi-Disciplinary Care Plan Details Patient Name: Date of Service: [name] 06/30/2019 10:30 A M Medical Record Number: [id number] Patient Account Number: [id number] Date of Birth/Sex: Treating RN: August 26, 1927 (86 y.o. Female) Zenaida Deed Primary Care Britne Borelli: PA Zenovia Jordan, NO Other Clinician: Referring Jushua Waltman: Treating Dimitriy Carreras/Extender: Skeet Simmer in Treatment: 0 Active Inactive Abuse / Safety / Falls / Self Care Management Nursing Diagnoses: Potential for falls Goals: Patient/caregiver will verbalize/demonstrate measures taken to prevent injury and/or falls Date Initiated: 06/30/2019 Target Resolution Date: 07/28/2019 Goal Status: Active Interventions: Assess fall risk on admission and as needed Notes: Venous Leg Ulcer Nursing Diagnoses: Knowledge deficit related to disease process and management Potential for venous Insuffiency (use before diagnosis confirmed) Goals: Patient will maintain optimal edema control Date Initiated: 06/30/2019 Target Resolution Date: 07/28/2019 Goal Status: Active Patient/caregiver will verbalize understanding of disease process and disease management Date Initiated: 06/30/2019 Target Resolution Date: 07/28/2019 Goal Status: Active Interventions: Assess peripheral edema status every visit. Compression as ordered Provide education on venous insufficiency Treatment Activities: Therapeutic compression applied : 06/30/2019 Notes: Wound/Skin Impairment Nursing Diagnoses: Impaired tissue integrity Knowledge deficit  related to ulceration/compromised skin integrity Goals: Patient/caregiver will verbalize understanding of skin care regimen Date Initiated: 06/30/2019 Target Resolution Date: 07/28/2019 Goal Status: Active Ulcer/skin breakdown will have a volume reduction of 30% by week 4 Date Initiated: 06/30/2019 Target Resolution Date: 07/28/2019 Goal Status: Active Interventions: Assess patient/caregiver ability to obtain necessary supplies Assess patient/caregiver ability to perform ulcer/skin care regimen upon admission and as needed Assess ulceration(s) every visit Treatment Activities: Skin care regimen initiated : 06/30/2019 Topical wound management initiated : 06/30/2019 Notes: Electronic Signature(s) Signed: 06/30/2019 5:03:53 PM By: Zenaida Deed RN, BSN Entered By: Zenaida Deed on 06/30/2019 12:17:58 -------------------------------------------------------------------------------- Pain Assessment Details Patient Name: Date of Service: [name] 06/30/2019 10:30 A M Medical Record Number: [id number] Patient Account Number: [id number] Date of Birth/Sex: Treating RN: May 30, 1927 (86 y.o. Female) Zandra Abts Primary Care Trinisha Paget:  PA TIENT, NO Other Clinician: Referring Dailyn Kempner: Treating Signe Tackitt/Extender: Skeet Simmer in Treatment: 0 Active Problems Location of Pain Severity and Description of Pain Patient Has Paino Yes Site Locations Pain Location: Pain in Ulcers With Dressing Change: Yes Duration of the Pain. Constant / Intermittento Intermittent Rate the pain. Current Pain Level: 4 Character of Pain Describe the Pain: Burning Pain Management and Medication Current Pain Management: Medication: Yes Cold Application: No Rest: No Massage: No Activity: No T.E.N.S.: No Heat Application: No Leg drop or elevation: No Is the Current Pain Management Adequate: Adequate How does your wound impact your activities of daily livingo Sleep: No Bathing: No Appetite:  No Relationship With Others: No Bladder Continence: No Emotions: No Bowel Continence: No Work: No Toileting: No Drive: No Dressing: No Hobbies: No Electronic Signature(s) Signed: 07/01/2019 5:16:23 PM By: Zandra Abts RN, BSN Entered By: Zandra Abts on 06/30/2019 11:46:49 -------------------------------------------------------------------------------- Patient/Caregiver Education Details Patient Name: Date of Service: Debbe Mounts 5/5/2021andnbsp10:30 A M Medical Record Number: [id number] Patient Account Number: [id number] Date of Birth/Gender: Treating RN: [birth year] (86 y.o. Female) Zenaida Deed Primary Care Physician: PA Zenovia Jordan, NO Other Clinician: Referring Physician: Treating Physician/Extender: Skeet Simmer in Treatment: 0 Education Assessment Education Provided To: Patient Education Topics Provided Venous: Handouts: Controlling Swelling with Multilayered Compression Wraps Methods: Explain/Verbal, Printed Responses: Reinforcements needed, State content correctly [facility]: o Handouts: Welcome [facility] o Methods: Explain/Verbal, Printed Responses: Reinforcements needed, State content correctly Wound/Skin Impairment: Methods: Explain/Verbal Responses: Reinforcements needed, State content correctly Electronic Signature(s) Signed: 06/30/2019 5:03:53 PM By: Zenaida Deed RN, BSN Entered By: Zenaida Deed on 06/30/2019 12:29:06 -------------------------------------------------------------------------------- Wound Assessment Details Patient Name: Date of Service: [name] 06/30/2019 10:30 A M Medical Record Number: [id number] Patient Account Number: [id number] Date of Birth/Sex: Treating RN: 06/10/1927 (86 y.o. Female) Zandra Abts Primary Care Cori Justus: PA TIENT, NO Other Clinician: Referring Peter Daquila: Treating Paras Kreider/Extender: Skeet Simmer in Treatment: 0 Wound Status Wound  Number: 1 Primary Etiology: Venous Leg Ulcer Wound Location: Right, Lateral Lower Leg Wound Status: Open Wounding Event: Gradually Appeared Comorbid History: Arrhythmia, Congestive Heart Failure, Hypertension Date Acquired: 05/27/2019 Weeks Of Treatment: 0 Clustered Wound: No Photos Photo Uploaded By: Benjaman Kindler on 07/01/2019 12:49:51 Wound Measurements Length: (cm) 2 Width: (cm) 2 Depth: (cm) 0.2 Area: (cm) 3.142 Volume: (cm) 0.628 % Reduction in Area: % Reduction in Volume: Epithelialization: None Tunneling: No Undermining: No Wound Description Classification: Full Thickness Without Exposed Support Structures Wound Margin: Flat and Intact Exudate Amount: Medium Exudate Type: Serosanguineous Exudate Color: red, brown Foul Odor After Cleansing: No Slough/Fibrino Yes Wound Bed Granulation Amount: Medium (34-66%) Exposed Structure Granulation Quality: Pink Fascia Exposed: No Necrotic Amount: Medium (34-66%) Fat Layer (Subcutaneous Tissue) Exposed: Yes Necrotic Quality: Adherent Slough Tendon Exposed: No Muscle Exposed: No Joint Exposed: No Bone Exposed: No Electronic Signature(s) Signed: 07/01/2019 5:16:23 PM By: Zandra Abts RN, BSN Entered By: Zandra Abts on 06/30/2019 11:45:54 -------------------------------------------------------------------------------- Vitals Details Patient Name: Date of Service: [name] 06/30/2019 10:30 A M Medical Record Number: [id number] Patient Account Number: [id number] Date of Birth/Sex: Treating RN: 1928/02/13 (86 y.o. Female) Zandra Abts Primary Care Desia Saban: PA TIENT, NO Other Clinician: Referring Emmery Seiler: Treating Shafter Jupin/Extender: Skeet Simmer in Treatment: 0 Vital Signs Time Taken: 11:21 Temperature (F): 97.7 Height (in): 60 Pulse (bpm): 79 Source: Stated Respiratory Rate (breaths/min): 16 Weight (lbs): 125 Blood Pressure (mmHg): 137/85 Source: Stated Reference Range: 80 - 120  mg /  dl Body Mass Index (BMI): 24.4 Electronic Signature(s) Signed: 07/01/2019 5:16:23 PM By: Levan Hurst RN, BSN Entered By: Levan Hurst on 06/30/2019 11:22:06

## 2019-07-01 NOTE — Progress Notes (Signed)
VENA, BASSINGER ([id number]) Visit Report for 06/30/2019 Abuse/Suicide Risk Screen Details Patient Name: Date of Service: Mary Chaney, Mary Chaney 06/30/2019 10:30 A M Medical Record Number: [id number] Patient Account Number: [id number] Date of Birth/Sex: Treating RN: 1927-08-13 (86 y.o. Female) Levan Hurst Primary Care Jillana Selph: PA Haig Prophet, NO Other Clinician: Referring Rhanda Lemire: Treating Joseph Johns/Extender: Sharalyn Ink in Treatment: 0 Abuse/Suicide Risk Screen Items Answer ABUSE RISK SCREEN: Has anyone close to you tried to hurt or harm you recentlyo No Do you feel uncomfortable with anyone in your familyo No Has anyone forced you do things that you didnt want to doo No Electronic Signature(s) Signed: 07/01/2019 5:16:23 PM By: Levan Hurst RN, BSN Entered By: Levan Hurst on 06/30/2019 11:40:32 -------------------------------------------------------------------------------- Activities of Daily Living Details Patient Name: Date of Service: Mary Chaney, Mary Chaney 06/30/2019 10:30 A M Medical Record Number: [id number] Patient Account Number: [id number] Date of Birth/Sex: Treating RN: [birth year] (86 y.o. Female) Levan Hurst Primary Care Cherita Hebel: PA Haig Prophet, NO Other Clinician: Referring Demetrios Byron: Treating Anayiah Howden/Extender: Sharalyn Ink in Treatment: 0 Activities of Daily Living Items Answer Activities of Daily Living (Please select one for each item) Drive Automobile Completely Able T Medications ake Completely Able Use T elephone Completely Able Care for Appearance Completely Able Use T oilet Completely Able Bath / Shower Completely Able Dress Self Completely Able Feed Self Completely Able Walk Completely Able Get In / Out Bed Completely Able Housework Completely Able Prepare Meals Completely [place] for Self Completely Able Electronic Signature(s) Signed: 07/01/2019 5:16:23 PM By: Levan Hurst RN, BSN Entered By: Levan Hurst  on 06/30/2019 11:41:01 -------------------------------------------------------------------------------- Education Screening Details Patient Name: Date of Service: Mary Alm E. 06/30/2019 10:30 A M Medical Record Number: [id number] Patient Account Number: [id number] Date of Birth/Sex: Treating RN: 05-Jul-1927 (86 y.o. Female) Levan Hurst Primary Care Jamarie Joplin: PA Haig Prophet, NO Other Clinician: Referring Fianna Snowball: Treating Robet Crutchfield/Extender: Sharalyn Ink in Treatment: 0 Primary Learner Assessed: Patient Learning Preferences/Education Level/Primary Language Learning Preference: Explanation, Demonstration, Printed Material Highest Education Level: College or Above Preferred Language: English Cognitive Barrier Language Barrier: No Translator Needed: No Memory Deficit: No Emotional Barrier: No Cultural/Religious Beliefs Affecting Medical Care: No Physical Barrier Impaired Vision: No Impaired Hearing: No Decreased Hand dexterity: No Knowledge/Comprehension Knowledge Level: High Comprehension Level: High Ability to understand written instructions: High Ability to understand verbal instructions: High Motivation Anxiety Level: Calm Cooperation: Cooperative Education Importance: Acknowledges Need Interest in Health Problems: Asks Questions Perception: Coherent Willingness to Engage in Self-Management High Activities: Readiness to Engage in Self-Management High Activities: Electronic Signature(s) Signed: 07/01/2019 5:16:23 PM By: Levan Hurst RN, BSN Entered By: Levan Hurst on 06/30/2019 11:41:32 -------------------------------------------------------------------------------- Fall Risk Assessment Details Patient Name: Date of Service: Mary Alm E. 06/30/2019 10:30 A M Medical Record Number: [id number] Patient Account Number: [id number] Date of Birth/Sex: Treating RN: [birth year] (86 y.o. Female) Levan Hurst Primary Care Aerianna Losey: PA TIENT, NO Other  Clinician: Referring Soham Hollett: Treating Argelia Formisano/Extender: Sharalyn Ink in Treatment: 0 Fall Risk Assessment Items Have you had 2 or more falls in the last 12 monthso 0 No Have you had any fall that resulted in injury in the last 12 monthso 0 No FALLS RISK SCREEN History of falling - immediate or within 3 months 0 No Secondary diagnosis (Do you have 2 or more medical diagnoseso) 0 No Ambulatory aid None/bed rest/wheelchair/nurse 0 No Crutches/cane/walker 15 Yes Furniture 0 No Intravenous therapy Access/Saline/Heparin Lock 0 No Gait/Transferring Normal/ bed rest/ wheelchair 0  No Weak (short steps with or without shuffle, stooped but able to lift head while walking, may seek 10 Yes support from furniture) Impaired (short steps with shuffle, may have difficulty arising from chair, head down, impaired 0 No balance) Mental Status Oriented to own ability 0 Yes Electronic Signature(s) Signed: 07/01/2019 5:16:23 PM By: Mary Abts RN, BSN Entered By: Mary Chaney on 06/30/2019 11:42:28 -------------------------------------------------------------------------------- Foot Assessment Details Patient Name: Date of Service: Mary Seat E. 06/30/2019 10:30 A M Medical Record Number: [id number] Patient Account Number: [id number] Date of Birth/Sex: Treating RN: Dec 22, 1927 (86 y.o. Female) Mary Chaney Primary Care Lynzy Rawles: PA TIENT, NO Other Clinician: Referring Hazim Treadway: Treating Temple Sporer/Extender: Skeet Simmer in Treatment: 0 Foot Assessment Items Site Locations + = Sensation present, - = Sensation absent, C = Callus, U = Ulcer R = Redness, W = Warmth, M = Maceration, PU = Pre-ulcerative lesion F = Fissure, S = Swelling, D = Dryness Assessment Right: Left: Other Deformity: No No Prior Foot Ulcer: No No Prior Amputation: No No Charcot Joint: No No Ambulatory Status: Ambulatory With Help Assistance Device: Cane Gait: Steady Electronic  Signature(s) Signed: 07/01/2019 5:16:23 PM By: Mary Abts RN, BSN Entered By: Mary Chaney on 06/30/2019 11:44:19 -------------------------------------------------------------------------------- Nutrition Risk Screening Details Patient Name: Date of Service: Mary Seat E. 06/30/2019 10:30 A M Medical Record Number: [id number] Patient Account Number: [id number] Date of Birth/Sex: Treating RN: 1927-05-27 (86 y.o. Female) Mary Chaney Primary Care Francetta Ilg: PA TIENT, NO Other Clinician: Referring Elmire Amrein: Treating Cova Knieriem/Extender: Skeet Simmer in Treatment: 0 Height (in): 60 Weight (lbs): 125 Body Mass Index (BMI): 24.4 Nutrition Risk Screening Items Score Screening NUTRITION RISK SCREEN: I have an illness or condition that made me change the kind and/or amount of food I eat 0 No I eat fewer than two meals per day 0 No I eat few fruits and vegetables, or milk products 0 No I have three or more drinks of beer, liquor or wine almost every day 0 No I have tooth or mouth problems that make it hard for me to eat 0 No I don't always have enough money to buy the food I need 0 No I eat alone most of the time 0 No I take three or more different prescribed or over-the-counter drugs a day 1 Yes Without wanting to, I have lost or gained 10 pounds in the last six months 0 No I am not always physically able to shop, cook and/or feed myself 0 No Nutrition Protocols Good Risk Protocol Moderate Risk Protocol 0 Provide education on nutrition High Risk Proctocol Risk Level: Good Risk Score: 1 Electronic Signature(s) Signed: 07/01/2019 5:16:23 PM By: Mary Abts RN, BSN Entered By: Mary Chaney on 06/30/2019 11:43:14

## 2019-07-07 ENCOUNTER — Encounter (HOSPITAL_BASED_OUTPATIENT_CLINIC_OR_DEPARTMENT_OTHER): Payer: Medicare Other | Admitting: Physician Assistant

## 2019-07-07 DIAGNOSIS — I872 Venous insufficiency (chronic) (peripheral): Secondary | ICD-10-CM | POA: Diagnosis not present

## 2019-07-07 DIAGNOSIS — I480 Paroxysmal atrial fibrillation: Secondary | ICD-10-CM | POA: Diagnosis not present

## 2019-07-07 DIAGNOSIS — L97812 Non-pressure chronic ulcer of other part of right lower leg with fat layer exposed: Secondary | ICD-10-CM | POA: Diagnosis not present

## 2019-07-07 DIAGNOSIS — I890 Lymphedema, not elsewhere classified: Secondary | ICD-10-CM | POA: Diagnosis not present

## 2019-07-07 DIAGNOSIS — I110 Hypertensive heart disease with heart failure: Secondary | ICD-10-CM | POA: Diagnosis not present

## 2019-07-07 DIAGNOSIS — I5042 Chronic combined systolic (congestive) and diastolic (congestive) heart failure: Secondary | ICD-10-CM | POA: Diagnosis not present

## 2019-07-10 ENCOUNTER — Other Ambulatory Visit: Payer: Self-pay

## 2019-07-10 ENCOUNTER — Encounter (HOSPITAL_BASED_OUTPATIENT_CLINIC_OR_DEPARTMENT_OTHER): Payer: Medicare Other | Admitting: Internal Medicine

## 2019-07-10 DIAGNOSIS — I890 Lymphedema, not elsewhere classified: Secondary | ICD-10-CM | POA: Diagnosis not present

## 2019-07-10 DIAGNOSIS — I872 Venous insufficiency (chronic) (peripheral): Secondary | ICD-10-CM | POA: Diagnosis not present

## 2019-07-10 DIAGNOSIS — I5042 Chronic combined systolic (congestive) and diastolic (congestive) heart failure: Secondary | ICD-10-CM | POA: Diagnosis not present

## 2019-07-10 DIAGNOSIS — I480 Paroxysmal atrial fibrillation: Secondary | ICD-10-CM | POA: Diagnosis not present

## 2019-07-10 DIAGNOSIS — L97812 Non-pressure chronic ulcer of other part of right lower leg with fat layer exposed: Secondary | ICD-10-CM | POA: Diagnosis not present

## 2019-07-10 DIAGNOSIS — I110 Hypertensive heart disease with heart failure: Secondary | ICD-10-CM | POA: Diagnosis not present

## 2019-07-13 NOTE — Progress Notes (Signed)
Mary, Chaney ([id number]) Visit Report for 07/10/2019 Debridement Details Patient Name: Date of Service: Mary, Chaney 07/10/2019 11:15 A M Medical Record Number: [id number] Patient Account Number: [id number] Date of Birth/Sex: Treating RN: [birth year] (86 y.o. Mary Chaney Primary Care Chaney: Mary Mary Chaney, [place] Other Clinician: Referring Chaney: Treating Chaney/Extender: Mary Chaney in Treatment: 1 Debridement Performed for Assessment: Wound #1 Right,Lateral Lower Leg Performed By: Physician Mary Chaney., MD Debridement Type: Debridement Severity of Tissue Pre Debridement: Fat layer exposed Level of Consciousness (Pre-procedure): Awake and Alert Pre-procedure Verification/Time Out Yes - 11:30 Taken: Start Time: 11:30 Pain Control: Other : benzocaine 20% spray T Area Debrided (L x W): otal 1.9 (cm) x 1.7 (cm) = 3.23 (cm) Tissue and other material debrided: Viable, Non-Viable, Slough, Subcutaneous, Slough Level: Skin/Subcutaneous Tissue Debridement Description: Excisional Instrument: Curette Bleeding: Minimum Hemostasis Achieved: Pressure End Time: 11:32 Procedural Pain: 5 Post Procedural Pain: 3 Response to Treatment: Procedure was tolerated well Level of Consciousness (Post- Awake and Alert procedure): Post Debridement Measurements of Total Wound Length: (cm) 1.9 Width: (cm) 1.7 Depth: (cm) 0.3 Volume: (cm) 0.761 Character of Wound/Ulcer Post Debridement: Improved Severity of Tissue Post Debridement: Fat layer exposed Post Procedure Diagnosis Same as Pre-procedure Electronic Signature(s) Signed: 07/10/2019 12:12:28 PM By: Mary Najjar MD Signed: 07/13/2019 5:27:28 PM By: Mary Deed RN, BSN Entered By: Mary Chaney on 07/10/2019 11:49:34 -------------------------------------------------------------------------------- HPI Details Patient Name: Date of Service: Mary Seat E. 07/10/2019 11:15 A M Medical Record Number:  [id number] Patient Account Number: [id number] Date of Birth/Sex: Treating RN: 1927-04-06 (86 y.o. Mary Chaney Primary Care Chaney: Mary Mary Chaney, [place] Other Clinician: Referring Chaney: Treating Chaney/Extender: Mary Chaney in Treatment: 1 History of Present Illness HPI Description: 06/30/2019 upon evaluation today patient presents for initial inspection here in our clinic concerning issues that she has been having with a ulcer on her right lateral lower extremity. She tells me that this has been present for about a month. She notes it came up initially as a blister that just showed up out of nowhere and then subsequently has worsened from that point. She has a lot of swelling and history of venous insufficiency. She also has what appears to be mild lymphedema and hemosiderin staining. The patient also has atrial fibrillation, is on long-term anticoagulant therapy, has hypertension, and congestive heart failure. Currently she tells me that the wound is giving her some trouble as far as pain is concerned but fortunately does not appear to be extremely painful which is good news. No fevers, chills, nausea, vomiting, or diarrhea. She has in the past worn compression socks but tells me its been about a year since she did. They are very difficult to get on she tells me. 5/15; patient admitted to the clinic 10 days ago. She has a wound on her right lateral lower leg in the setting of severe chronic venous insufficiency with tightly adherent fibrous skin in her lower extremities. We have her with Hydrofera Blue and 3 layer compression Electronic Signature(s) Signed: 07/10/2019 12:12:28 PM By: Mary Najjar MD Entered By: Mary Chaney on 07/10/2019 11:44:36 -------------------------------------------------------------------------------- Physical Exam Details Patient Name: Date of Service: Mary Seat E. 07/10/2019 11:15 A M Medical Record Number: [id number] Patient Account Number:  [id number] Date of Birth/Sex: Treating RN: 1927/11/23 (86 y.o. Mary Chaney Primary Care Chaney: Mary Mary Chaney, [place] Other Clinician: Referring Chaney: Treating Chaney/Extender: Mary Chaney in Treatment: 1 Constitutional Patient is hypertensive.. Pulse regular and within target range for patient.Marland Kitchen  Respirations regular, non-labored and within target range.. Temperature is normal and within the target range for the patient.Marland Kitchen Appears in no distress. Cardiovascular Pedal pulses are intact. Changes of severe chronic venous insufficiency, edema dermatofibrosis.. Notes Wound exam; patient has an oval-shaped wound. Still some slough on the surface of the wound and some gritty debris as well. Wash this off with Anasept and gauze then using a #3 curette remove the slough. Hemostasis with direct pressure. Electronic Signature(s) Signed: 07/10/2019 12:12:28 PM By: Mary Ham MD Entered By: Mary Chaney on 07/10/2019 11:47:22 -------------------------------------------------------------------------------- Physician Orders Details Patient Name: Date of Service: Mary Alm E. 07/10/2019 11:15 A M Medical Record Number: [id number] Patient Account Number: [id number] Date of Birth/Sex: Treating RN: [birth year] (86 y.o. Mary Chaney: Mary Chaney, [place] Other Clinician: Referring Chaney: Treating Chaney/Extender: Mary Chaney in Treatment: 1 Verbal / Phone Orders: No Diagnosis Coding ICD-10 Coding Code Description I87.2 Venous insufficiency (chronic) (peripheral) I89.0 Lymphedema, not elsewhere classified L97.812 Non-pressure chronic ulcer of other part of right lower leg with fat layer exposed I48.0 Paroxysmal atrial fibrillation I50.42 Chronic combined systolic (congestive) and diastolic (congestive) heart failure I10 Essential (primary) hypertension Z79.01 Long term (current) use of anticoagulants Follow-up Appointments ppointment in 2  weeks. - Wed 5/26 Return A Nurse Visit: - Wed 5/19 Dressing Change Frequency Wound #1 Right,Lateral Lower Leg Do not change entire dressing for one week. Skin Barriers/Peri-Wound Care Moisturizing lotion - to leg Wound Cleansing Wound #1 Right,Lateral Lower Leg May shower with protection. Primary Wound Dressing Wound #1 Right,Lateral Lower Leg Hydrofera Blue - classic Secondary Dressing Wound #1 Right,Lateral Lower Leg Dry Gauze Edema Control 3 Layer Compression System - Right Lower Extremity Avoid standing for long periods of time Elevate legs to the level of the heart or above for 30 minutes daily and/or when sitting, a frequency of: - throughout the day Exercise regularly Support Garment 20-30 mm/Hg pressure to: - compression stocking to left leg daily Electronic Signature(s) Signed: 07/10/2019 12:12:28 PM By: Mary Ham MD Signed: 07/13/2019 5:27:28 PM By: Baruch Gouty RN, BSN Entered By: Baruch Gouty on 07/10/2019 11:35:53 -------------------------------------------------------------------------------- Problem List Details Patient Name: Date of Service: Mary Alm E. 07/10/2019 11:15 A M Medical Record Number: [id number] Patient Account Number: [id number] Date of Birth/Sex: Treating RN: 10-Oct-1927 (86 y.o. Martyn Malay, Vaughan Basta Primary Care Chaney: Mary Haig Chaney, [place] Other Clinician: Referring Chaney: Treating Chaney/Extender: Mary Chaney in Treatment: 1 Active Problems ICD-10 Encounter Code Description Active Date MDM Diagnosis I87.2 Venous insufficiency (chronic) (peripheral) 06/30/2019 No Yes I89.0 Lymphedema, not elsewhere classified 06/30/2019 No Yes L97.812 Non-pressure chronic ulcer of other part of right lower leg with fat layer 06/30/2019 No Yes exposed I48.0 Paroxysmal atrial fibrillation 06/30/2019 No Yes I50.42 Chronic combined systolic (congestive) and diastolic (congestive) heart failure 06/30/2019 No Yes I10 Essential (primary)  hypertension 06/30/2019 No Yes Z79.01 Long term (current) use of anticoagulants 06/30/2019 No Yes Inactive Problems Resolved Problems Electronic Signature(s) Signed: 07/10/2019 12:12:28 PM By: Mary Ham MD Entered By: Mary Chaney on 07/10/2019 11:43:18 -------------------------------------------------------------------------------- Progress Note Details Patient Name: Date of Service: Mary Alm E. 07/10/2019 11:15 A M Medical Record Number: [id number] Patient Account Number: [id number] Date of Birth/Sex: Treating RN: [birth year] (86 y.o. Mary Chaney: Mary Chaney, [place] Other Clinician: Referring Chaney: Treating Chaney/Extender: Mary Chaney in Treatment: 1 Subjective History of Present Illness (HPI) 06/30/2019 upon evaluation today patient presents for initial inspection here in our clinic concerning issues that she has been  having with a ulcer on her right lateral lower extremity. She tells me that this has been present for about a month. She notes it came up initially as a blister that just showed up out of nowhere and then subsequently has worsened from that point. She has a lot of swelling and history of venous insufficiency. She also has what appears to be mild lymphedema and hemosiderin staining. The patient also has atrial fibrillation, is on long-term anticoagulant therapy, has hypertension, and congestive heart failure. Currently she tells me that the wound is giving her some trouble as far as pain is concerned but fortunately does not appear to be extremely painful which is good news. No fevers, chills, nausea, vomiting, or diarrhea. She has in the past worn compression socks but tells me its been about a year since she did. They are very difficult to get on she tells me. 5/15; patient admitted to the clinic 10 days ago. She has a wound on her right lateral lower leg in the setting of severe chronic venous insufficiency with  tightly adherent fibrous skin in her lower extremities. We have her with Hydrofera Blue and 3 layer compression Objective Constitutional Patient is hypertensive.. Pulse regular and within target range for patient.Marland Kitchen Respirations regular, non-labored and within target range.. Temperature is normal and within the target range for the patient.Marland Kitchen Appears in no distress. Vitals Time Taken: 11:15 AM, Height: 60 in, Weight: 125 lbs, BMI: 24.4, Temperature: 98 F, Pulse: 67 bpm, Respiratory Rate: 18 breaths/min, Blood Pressure: 151/90 mmHg. Cardiovascular Pedal pulses are intact. Changes of severe chronic venous insufficiency, edema dermatofibrosis.. General Notes: Wound exam; patient has an oval-shaped wound. Still some slough on the surface of the wound and some gritty debris as well. Wash this off with Anasept and gauze then using a #3 curette remove the slough. Hemostasis with direct pressure. Integumentary (Hair, Skin) Wound #1 status is Open. Original cause of wound was Gradually Appeared. The wound is located on the Right,Lateral Lower Leg. The wound measures 1.9cm length x 1.7cm width x 0.3cm depth; 2.537cm^2 area and 0.761cm^3 volume. There is Fat Layer (Subcutaneous Tissue) Exposed exposed. There is no tunneling or undermining noted. There is a medium amount of serosanguineous drainage noted. The wound margin is flat and intact. There is medium (34-66%) pink granulation within the wound bed. There is a medium (34-66%) amount of necrotic tissue within the wound bed including Adherent Slough. Assessment Active Problems ICD-10 Venous insufficiency (chronic) (peripheral) Lymphedema, not elsewhere classified Non-pressure chronic ulcer of other part of right lower leg with fat layer exposed Paroxysmal atrial fibrillation Chronic combined systolic (congestive) and diastolic (congestive) heart failure Essential (primary) hypertension Long term (current) use of anticoagulants Procedures Wound  #1 Pre-procedure diagnosis of Wound #1 is a Venous Leg Ulcer located on the Right,Lateral Lower Leg .Severity of Tissue Pre Debridement is: Fat layer exposed. There was a Excisional Skin/Subcutaneous Tissue Debridement with a total area of 3.23 sq cm performed by Mary Chaney., MD. With the following instrument(s): Curette to remove Viable and Non-Viable tissue/material. Material removed includes Subcutaneous Tissue and Slough and after achieving pain control using Other (benzocaine 20% spray). No specimens were taken. A time out was conducted at 11:30, prior to the start of the procedure. A Minimum amount of bleeding was controlled with Pressure. The procedure was tolerated well with a pain level of 5 throughout and a pain level of 3 following the procedure. Post Debridement Measurements: 1.9cm length x 1.7cm width x 0.3cm depth; 0.761cm^3  volume. Character of Wound/Ulcer Post Debridement is improved. Severity of Tissue Post Debridement is: Fat layer exposed. Post procedure Diagnosis Wound #1: Same as Pre-Procedure Pre-procedure diagnosis of Wound #1 is a Venous Leg Ulcer located on the Right,Lateral Lower Leg . There was a Three Layer Compression Therapy Procedure by Cherylin Mylar, RN. Post procedure Diagnosis Wound #1: Same as Pre-Procedure Plan Follow-up Appointments: Return Appointment in 2 weeks. - Wed 5/26 Nurse Visit: - Wed 5/19 Dressing Change Frequency: Wound #1 Right,Lateral Lower Leg: Do not change entire dressing for one week. Skin Barriers/Peri-Wound Care: Moisturizing lotion - to leg Wound Cleansing: Wound #1 Right,Lateral Lower Leg: May shower with protection. Primary Wound Dressing: Wound #1 Right,Lateral Lower Leg: Hydrofera Blue - classic Secondary Dressing: Wound #1 Right,Lateral Lower Leg: Dry Gauze Edema Control: 3 Layer Compression System - Right Lower Extremity Avoid standing for long periods of time Elevate legs to the level of the heart or above for  30 minutes daily and/or when sitting, a frequency of: - throughout the day Exercise regularly Support Garment 20-30 mm/Hg pressure to: - compression stocking to left leg daily 1. No need to change the primary dressing which is 3 layer compression with Hydrofera Blue 2. Went over the pathophysiology of a wound like this vis--vis her chronic venous insufficiency. 3. They asked me if there was any other procedures. I did describe reflux studies but that I do not usually recommend them for somebody who has not had a trial of Chaney compression stockings 4. We will give her her measurements and the number for elastic therapy for 20/30 below-knee stockings Electronic Signature(s) Signed: 07/10/2019 12:12:28 PM By: Mary Najjar MD Entered By: Mary Chaney on 07/10/2019 11:50:29 -------------------------------------------------------------------------------- SuperBill Details Patient Name: Date of Service: Mary Seat E. 07/10/2019 Medical Record Number: [id number] Patient Account Number: [id number] Date of Birth/Sex: Treating RN: 1927/04/18 (86 y.o. Mary Chaney Primary Care Chaney: Mary Mary Chaney, [place] Other Clinician: Referring Chaney: Treating Chaney/Extender: Mary Chaney in Treatment: 1 Diagnosis Coding ICD-10 Codes Code Description I87.2 Venous insufficiency (chronic) (peripheral) I89.0 Lymphedema, not elsewhere classified L97.812 Non-pressure chronic ulcer of other part of right lower leg with fat layer exposed I48.0 Paroxysmal atrial fibrillation I50.42 Chronic combined systolic (congestive) and diastolic (congestive) heart failure I10 Essential (primary) hypertension Z79.01 Long term (current) use of anticoagulants Facility Procedures CPT4 Code: [id number] Description: 11042 - DEB SUBQ TISSUE 20 SQ CM/< ICD-10 Diagnosis Description L97.812 Non-pressure chronic ulcer of other part of right lower leg with fat layer exp Modifier: osed Quantity: 1 Physician  Procedures : CPT4 Code Description Modifier [id number] 11042 - WC PHYS SUBQ TISS 20 SQ CM ICD-10 Diagnosis Description L97.812 Non-pressure chronic ulcer of other part of right lower leg with fat layer exposed Quantity: 1 Electronic Signature(s) Signed: 07/10/2019 12:12:28 PM By: Mary Najjar MD Entered By: Mary Chaney on 07/10/2019 11:50:37

## 2019-07-13 NOTE — Progress Notes (Signed)
Mary Chaney, Mary Chaney ([id number]) Visit Report for 07/07/2019 Arrival Information Details Patient Name: Date of Service: Mary Chaney, Mary Chaney 07/07/2019 1:00 PM Medical Record Number: [id number] Patient Account Number: [id number] Date of Birth/Sex: Treating RN: 1927-07-25 (86 y.o. [name] Primary Care Dorann Davidson: PA TIENT, NO Other Clinician: Referring Oma Alpert: Treating Gurneet Matarese/Extender: Sharalyn Ink in Treatment: 1 Visit Information History Since Last Visit All ordered tests and consults were completed: No Patient Arrived: Kasandra Knudsen Added or deleted any medications: No Arrival Time: 13:24 Any new allergies or adverse reactions: No Accompanied By: self Had a fall or experienced change in No Transfer Assistance: None activities of daily living that may affect Patient Identification Verified: Yes risk of falls: Secondary Verification Process Completed: Yes Signs or symptoms of abuse/neglect since last visito No Patient Requires Transmission-Based Precautions: No Hospitalized since last visit: No Patient Has Alerts: Yes Implantable device outside of the clinic excluding No Patient Alerts: Patient on Blood Thinner cellular tissue based products placed in the center since last visit: Has Dressing in Place as Prescribed: Yes Pain Present Now: Unable to Respond Electronic Signature(s) Signed: 07/13/2019 5:13:38 PM By: Carlene Coria RN Entered By: Carlene Coria on 07/07/2019 13:27:33 -------------------------------------------------------------------------------- Compression Therapy Details Patient Name: Date of Service: Mary Chaney, Mary E. 07/07/2019 1:00 PM Medical Record Number: [id number] Patient Account Number: [id number] Date of Birth/Sex: Treating RN: [birth year] (86 y.o. [name] Primary Care Nikoletta Varma: PA Haig Prophet, [place] Other Clinician: Referring Melda Mermelstein: Treating Lanesha Azzaro/Extender: Sharalyn Ink in Treatment: 1 Compression Therapy Performed for Wound Assessment:  Wound #1 Right,Lateral Lower Leg Performed By: Clinician Carlene Coria, RN Compression Type: Three Layer Electronic Signature(s) Signed: 07/13/2019 5:13:38 PM By: Carlene Coria RN Entered By: Carlene Coria on 07/07/2019 13:32:03 -------------------------------------------------------------------------------- Wound Assessment Details Patient Name: Date of Service: Mary Chaney, Mary Chaney 07/07/2019 1:00 PM Medical Record Number: [id number] Patient Account Number: [id number] Date of Birth/Sex: Treating RN: 12/05/1927 (86 y.o. [name] Primary Care Heraclio Seidman: PA TIENT, NO Other Clinician: Referring Cordella Nyquist: Treating Jesaiah Fabiano/Extender: Sharalyn Ink in Treatment: 1 Wound Status Wound Number: 1 Primary Etiology: Venous Leg Ulcer Wound Location: Right, Lateral Lower Leg Wound Status: Open Wounding Event: Gradually Appeared Comorbid History: Arrhythmia, Congestive Heart Failure, Hypertension Date Acquired: 05/27/2019 Weeks Of Treatment: 1 Clustered Wound: No Wound Measurements Length: (cm) 2 Width: (cm) 2 Depth: (cm) 0.2 Area: (cm) 3.142 Volume: (cm) 0.628 % Reduction in Area: 0% % Reduction in Volume: 0% Epithelialization: None Tunneling: No Undermining: No Wound Description Classification: Full Thickness Without Exposed Support Structures Wound Margin: Flat and Intact Exudate Amount: Medium Exudate Type: Serosanguineous Exudate Color: red, brown Foul Odor After Cleansing: No Slough/Fibrino Yes Wound Bed Granulation Amount: Medium (34-66%) Exposed Structure Granulation Quality: Pink Fascia Exposed: No Necrotic Amount: Medium (34-66%) Fat Layer (Subcutaneous Tissue) Exposed: Yes Necrotic Quality: Adherent Slough Tendon Exposed: No Muscle Exposed: No Joint Exposed: No Bone Exposed: No Electronic Signature(s) Signed: 07/13/2019 5:13:38 PM By: Carlene Coria RN Entered By: Carlene Coria on 07/07/2019  13:31:44 -------------------------------------------------------------------------------- Vitals Details Patient Name: Date of Service: Mary Rua NNIE E. 07/07/2019 1:00 PM Medical Record Number: [id number] Patient Account Number: [id number] Date of Birth/Sex: Treating RN: 11/06/1927 (86 y.o. [name] Primary Care Celestina Gironda: PA TIENT, NO Other Clinician: Referring Harrell Niehoff: Treating Emberlee Sortino/Extender: Sharalyn Ink in Treatment: 1 Vital Signs Time Taken: 13:27 Temperature (F): 98.6 Height (in): 60 Pulse (bpm): 102 Weight (lbs): 125 Respiratory Rate (breaths/min): 18 Body Mass Index (BMI): 24.4 Blood Pressure (mmHg): 138/70 Reference Range: 80 -  120 mg / dl Electronic Signature(s) Signed: 07/13/2019 5:13:38 PM By: Yevonne Pax RN Entered By: Yevonne Pax on 07/07/2019 13:30:39

## 2019-07-13 NOTE — Progress Notes (Signed)
RAILYNN, BALLO ([id number]) Visit Report for 07/10/2019 Arrival Information Details Patient Name: Date of Service: Mary Chaney, Mary Chaney 07/10/2019 11:15 A M Medical Record Number: [id number] Patient Account Number: [id number] Date of Birth/Sex: Treating RN: [birth year] (86 y.o. Harvest Dark Primary Care Erykah Lippert: PA Zenovia Jordan, [place] Other Clinician: Referring Amariyon Maynes: Treating Ovide Dusek/Extender: Valentino Saxon in Treatment: 1 Visit Information History Since Last Visit Added or deleted any medications: No Patient Arrived: Cane Any new allergies or adverse reactions: No Arrival Time: 11:18 Had a fall or experienced change in No Accompanied By: family member activities of daily living that may affect Transfer Assistance: None risk of falls: Patient Identification Verified: Yes Signs or symptoms of abuse/neglect since last visito No Secondary Verification Process Completed: Yes Hospitalized since last visit: No Patient Requires Transmission-Based Precautions: No Implantable device outside of the clinic excluding No Patient Has Alerts: Yes cellular tissue based products placed in the center Patient Alerts: Patient on Blood Thinner since last visit: Has Dressing in Place as Prescribed: Yes Has Compression in Place as Prescribed: Yes Pain Present Now: No Electronic Signature(s) Signed: 07/12/2019 5:05:18 PM By: Cherylin Mylar Entered By: Cherylin Mylar on 07/10/2019 11:18:21 -------------------------------------------------------------------------------- Compression Therapy Details Patient Name: Date of Service: Mary, REISEN Chaney E. 07/10/2019 11:15 A M Medical Record Number: [id number] Patient Account Number: [id number] Date of Birth/Sex: Treating RN: 1928/02/17 (86 y.o. Mary Chaney Primary Care Ashlen Kiger: PA Zenovia Jordan, [place] Other Clinician: Referring Kortne All: Treating Rosemary Pentecost/Extender: Valentino Saxon in Treatment: 1 Compression Therapy Performed for Wound  Assessment: Wound #1 Right,Lateral Lower Leg Performed By: Clinician Cherylin Mylar, RN Compression Type: Three Layer Post Procedure Diagnosis Same as Pre-procedure Electronic Signature(s) Signed: 07/13/2019 5:27:28 PM By: Zenaida Deed RN, BSN Entered By: Zenaida Deed on 07/10/2019 11:34:08 -------------------------------------------------------------------------------- Encounter Discharge Information Details Patient Name: Date of Service: Mary Seat E. 07/10/2019 11:15 A M Medical Record Number: [id number] Patient Account Number: [id number] Date of Birth/Sex: Treating RN: Dec 31, 1927 (86 y.o. Wynelle Link Primary Care Dasie Chancellor: PA Zenovia [place], [place] Other Clinician: Referring Ashan Cueva: Treating Evanthia Maund/Extender: Valentino Saxon in Treatment: 1 Encounter Discharge Information Items Post Procedure Vitals Discharge Condition: Stable Temperature (F): 98 Ambulatory Status: Cane Pulse (bpm): 97 Discharge Destination: Home Respiratory Rate (breaths/min): 18 Transportation: Private Auto Blood Pressure (mmHg): 151/90 Accompanied By: daughter Schedule Follow-up Appointment: Yes Clinical Summary of Care: Patient Declined Electronic Signature(s) Signed: 07/12/2019 5:54:52 PM By: Zandra Abts RN, BSN Entered By: Zandra Abts on 07/10/2019 11:53:58 -------------------------------------------------------------------------------- Lower Extremity Assessment Details Patient Name: Date of Service: Mary Seat E. 07/10/2019 11:15 A M Medical Record Number: [id number] Patient Account Number: [id number] Date of Birth/Sex: Treating RN: [birth year] (86 y.o. Harvest Dark Primary Care Sequita Wise: PA Zenovia Jordan, [place] Other Clinician: Referring Moustafa Mossa: Treating Maraki Macquarrie/Extender: Valentino Saxon in Treatment: 1 Edema Assessment Assessed: [Left: No] [Right: No] Edema: [Left: Ye] [Right: s] Calf Left: Right: Point of Measurement: 28 cm From Medial Instep cm 33.5  cm Ankle Left: Right: Point of Measurement: 11 cm From Medial Instep cm 20 cm Vascular Assessment Pulses: Dorsalis Pedis Palpable: [Right:Yes] Electronic Signature(s) Signed: 07/12/2019 5:05:18 PM By: Cherylin Mylar Entered By: Cherylin Mylar on 07/10/2019 11:22:53 -------------------------------------------------------------------------------- Multi Wound Chart Details Patient Name: Date of Service: Mary Seat E. 07/10/2019 11:15 A M Medical Record Number: [id number] Patient Account Number: [id number] Date of Birth/Sex: Treating RN: [birth year] (86 y.o. Mary Chaney Primary Care Nedim Oki: PA Zenovia Jordan, [place] Other Clinician: Referring Irys Nigh: Treating Allyse Fregeau/Extender: Valentino Saxon in Treatment: 1 Vital Signs Height(in): 60 Pulse(bpm):  67 Weight(lbs): 125 Blood Pressure(mmHg): 151/90 Body Mass Index(BMI): 24 Temperature(F): 98 Respiratory Rate(breaths/min): 18 Photos: [1:No Photos Right, Lateral Lower Leg] [N/A:N/A N/A] Wound Location: [1:Gradually Appeared] [N/A:N/A] Wounding Event: [1:Venous Leg Ulcer] [N/A:N/A] Primary Etiology: [1:Arrhythmia, Congestive Heart Failure,] [N/A:N/A] Comorbid History: [1:Hypertension 05/27/2019] [N/A:N/A] Date Acquired: [1:1] [N/A:N/A] Weeks of Treatment: [1:Open] [N/A:N/A] Wound Status: [1:1.9x1.7x0.3] [N/A:N/A] Measurements L x W x D (cm) [1:2.537] [N/A:N/A] A (cm) : rea [1:0.761] [N/A:N/A] Volume (cm) : [1:19.30%] [N/A:N/A] % Reduction in A [1:rea: -21.20%] [N/A:N/A] % Reduction in Volume: [1:Full Thickness Without Exposed] [N/A:N/A] Classification: [1:Support Structures Medium] [N/A:N/A] Exudate A mount: [1:Serosanguineous] [N/A:N/A] Exudate Type: [1:red, brown] [N/A:N/A] Exudate Color: [1:Flat and Intact] [N/A:N/A] Wound Margin: [1:Medium (34-66%)] [N/A:N/A] Granulation A mount: [1:Pink] [N/A:N/A] Granulation Quality: [1:Medium (34-66%)] [N/A:N/A] Necrotic A mount: [1:Fat Layer (Subcutaneous Tissue)]  [N/A:N/A] Exposed Structures: [1:Exposed: Yes Fascia: No Tendon: No Muscle: No Joint: No Bone: No None] [N/A:N/A] Epithelialization: [1:Debridement - Excisional] [N/A:N/A] Debridement: Pre-procedure Verification/Time Out 11:30 [N/A:N/A] Taken: [1:Other] [N/A:N/A] Pain Control: [1:Subcutaneous, Slough] [N/A:N/A] Tissue Debrided: [1:Skin/Subcutaneous Tissue] [N/A:N/A] Level: [1:3.23] [N/A:N/A] Debridement A (sq cm): [1:rea Curette] [N/A:N/A] Instrument: [1:Minimum] [N/A:N/A] Bleeding: [1:Pressure] [N/A:N/A] Hemostasis A chieved: [1:5] [N/A:N/A] Procedural Pain: [1:3] [N/A:N/A] Post Procedural Pain: [1:Procedure was tolerated well] [N/A:N/A] Debridement Treatment Response: [1:1.9x1.7x0.3] [N/A:N/A] Post Debridement Measurements L x W x D (cm) [1:0.761] [N/A:N/A] Post Debridement Volume: (cm) [1:Compression Therapy] [N/A:N/A] Procedures Performed: [1:Debridement] Treatment Notes Electronic Signature(s) Signed: 07/10/2019 12:12:28 PM By: Baltazar Najjar MD Signed: 07/13/2019 5:27:28 PM By: Zenaida Deed RN, BSN Signed: 07/13/2019 5:27:28 PM By: Zenaida Deed RN, BSN Entered By: Baltazar Najjar on 07/10/2019 11:43:36 -------------------------------------------------------------------------------- Pain Assessment Details Patient Name: Date of Service: JAMEEKA, MARCY Chaney E. 07/10/2019 11:15 A M Medical Record Number: [id number] Patient Account Number: [id number] Date of Birth/Sex: Treating RN: 09/11/1927 (86 y.o. Harvest Dark Primary Care Sheddrick Lattanzio: PA Zenovia Jordan, [place] Other Clinician: Referring Taino Maertens: Treating Dilraj Killgore/Extender: Valentino Saxon in Treatment: 1 Active Problems Location of Pain Severity and Description of Pain Patient Has Paino No Site Locations Pain Management and Medication Current Pain Management: Electronic Signature(s) Signed: 07/12/2019 5:05:18 PM By: Cherylin Mylar Entered By: Cherylin Mylar on 07/10/2019  11:22:44 -------------------------------------------------------------------------------- Wound Assessment Details Patient Name: Date of Service: DANNIA, SNOOK Chaney E. 07/10/2019 11:15 A M Medical Record Number: [id number] Patient Account Number: [id number] Date of Birth/Sex: Treating RN: 1928-02-12 (86 y.o. Harvest Dark Primary Care Artis Beggs: PA TIENT, [place] Other Clinician: Referring Shari Natt: Treating Aliese Brannum/Extender: Valentino Saxon in Treatment: 1 Wound Status Wound Number: 1 Primary Etiology: Venous Leg Ulcer Wound Location: Right, Lateral Lower Leg Wound Status: Open Wounding Event: Gradually Appeared Comorbid History: Arrhythmia, Congestive Heart Failure, Hypertension Date Acquired: 05/27/2019 Weeks Of Treatment: 1 Clustered Wound: No Photos Photo Uploaded By: Benjaman Kindler on 07/12/2019 11:16:40 Wound Measurements Length: (cm) 1.9 Width: (cm) 1.7 Depth: (cm) 0.3 Area: (cm) 2.537 Volume: (cm) 0.761 % Reduction in Area: 19.3% % Reduction in Volume: -21.2% Epithelialization: None Tunneling: No Undermining: No Wound Description Classification: Full Thickness Without Exposed Support Struct Wound Margin: Flat and Intact Exudate Amount: Medium Exudate Type: Serosanguineous Exudate Color: red, brown ures Foul Odor After Cleansing: No Slough/Fibrino Yes Wound Bed Granulation Amount: Medium (34-66%) Exposed Structure Granulation Quality: Pink Fascia Exposed: No Necrotic Amount: Medium (34-66%) Fat Layer (Subcutaneous Tissue) Exposed: Yes Necrotic Quality: Adherent Slough Tendon Exposed: No Muscle Exposed: No Joint Exposed: No Bone Exposed: No Treatment Notes Wound #1 (Right, Lateral Lower Leg) 1. Cleanse With Soap and water 2. Periwound Care Moisturizing lotion 3. Primary  Dressing Applied Hydrofera Blue 4. Secondary Dressing Dry Gauze 6. Support Layer Applied 3 layer compression wrap Notes netting Electronic Signature(s) Signed: 07/12/2019  5:05:18 PM By: Kela Millin Entered By: Kela Millin on 07/10/2019 11:24:02 -------------------------------------------------------------------------------- Vitals Details Patient Name: Date of Service: [name] 07/10/2019 11:15 A M Medical Record Number: [id number] Patient Account Number: [id number] Date of Birth/Sex: Treating RN: December 15, 1927 (86 y.o. Clearnce Sorrel Primary Care Blayne Garlick: PA TIENT, [place] Other Clinician: Referring Avila Albritton: Treating Loryn Haacke/Extender: Cheree Ditto in Treatment: 1 Vital Signs Time Taken: 11:15 Temperature (F): 98 Height (in): 60 Pulse (bpm): 67 Weight (lbs): 125 Respiratory Rate (breaths/min): 18 Body Mass Index (BMI): 24.4 Blood Pressure (mmHg): 151/90 Reference Range: 80 - 120 mg / dl Electronic Signature(s) Signed: 07/12/2019 5:05:18 PM By: Kela Millin Entered By: Kela Millin on 07/10/2019 11:18:38

## 2019-07-14 ENCOUNTER — Other Ambulatory Visit: Payer: Self-pay

## 2019-07-14 ENCOUNTER — Encounter (HOSPITAL_BASED_OUTPATIENT_CLINIC_OR_DEPARTMENT_OTHER): Payer: Medicare Other | Admitting: Physician Assistant

## 2019-07-14 DIAGNOSIS — I110 Hypertensive heart disease with heart failure: Secondary | ICD-10-CM | POA: Diagnosis not present

## 2019-07-14 DIAGNOSIS — L97812 Non-pressure chronic ulcer of other part of right lower leg with fat layer exposed: Secondary | ICD-10-CM | POA: Diagnosis not present

## 2019-07-14 DIAGNOSIS — I480 Paroxysmal atrial fibrillation: Secondary | ICD-10-CM | POA: Diagnosis not present

## 2019-07-14 DIAGNOSIS — I890 Lymphedema, not elsewhere classified: Secondary | ICD-10-CM | POA: Diagnosis not present

## 2019-07-14 DIAGNOSIS — I5042 Chronic combined systolic (congestive) and diastolic (congestive) heart failure: Secondary | ICD-10-CM | POA: Diagnosis not present

## 2019-07-14 DIAGNOSIS — I872 Venous insufficiency (chronic) (peripheral): Secondary | ICD-10-CM | POA: Diagnosis not present

## 2019-07-14 NOTE — Progress Notes (Signed)
Mary Chaney, Mary Chaney ([id number]) Visit Report for 07/14/2019 Arrival Information Details Patient Name: Date of Service: Mary Chaney, Mary Chaney 07/14/2019 8:00 A M Medical Record Number: [id number] Patient Account Number: [id number] Date of Birth/Sex: Treating RN: 29-Jun-1927 (86 y.o. [name] Primary Care Lilu Mcglown: PA [place], [place] Other Clinician: Referring Cedarius Kersh: Treating Brynnlie Unterreiner/Extender: Sharalyn Ink in Treatment: 2 Visit Information History Since Last Visit Added or deleted any medications: No Patient Arrived: Cane Any new allergies or adverse reactions: No Arrival Time: 08:02 Had a fall or experienced change in No Accompanied By: self activities of daily living that may affect Transfer Assistance: None risk of falls: Patient Identification Verified: Yes Signs or symptoms of abuse/neglect since last visito No Secondary Verification Process Completed: Yes Hospitalized since last visit: No Patient Requires Transmission-Based Precautions: No Implantable device outside of the clinic excluding No Patient Has Alerts: Yes cellular tissue based products placed in the center Patient Alerts: Patient on Blood Thinner since last visit: Has Dressing in Place as Prescribed: Yes Has Compression in Place as Prescribed: Yes Pain Present Now: No Electronic Signature(s) Signed: 07/14/2019 1:21:06 PM By: Deon Pilling Entered By: Deon Pilling on 07/14/2019 08:17:00 -------------------------------------------------------------------------------- Compression Therapy Details Patient Name: Date of Service: Mary Chaney, Mary E. 07/14/2019 8:00 A M Medical Record Number: [id number] Patient Account Number: [id number] Date of Birth/Sex: Treating RN: 09/06/1927 (86 y.o. [name] Primary Care Callie Facey: PA Haig Prophet, [place] Other Clinician: Referring Finneus Kaneshiro: Treating Letanya Froh/Extender: Sharalyn Ink in Treatment: 2 Compression Therapy Performed for Wound Assessment: Wound #1  Right,Lateral Lower Leg Performed By: Clinician Deon Pilling, RN Compression Type: Three Layer Pre Treatment ABI: 1.2 Electronic Signature(s) Signed: 07/14/2019 1:21:06 PM By: Deon Pilling Entered By: Deon Pilling on 07/14/2019 08:17:32 -------------------------------------------------------------------------------- Encounter Discharge Information Details Patient Name: Date of Service: Mary Rua NNIE E. 07/14/2019 8:00 A M Medical Record Number: [id number] Patient Account Number: [id number] Date of Birth/Sex: Treating RN: 1927/05/24 (86 y.o. [name] Primary Care Kemba Hoppes: PA Haig Prophet, [place] Other Clinician: Referring Jaxton Casale: Treating Leni Pankonin/Extender: Sharalyn Ink in Treatment: 2 Encounter Discharge Information Items Discharge Condition: Stable Ambulatory Status: Cane Discharge Destination: Home Transportation: Private Auto Accompanied By: self Schedule Follow-up Appointment: Yes Clinical Summary of Care: Electronic Signature(s) Signed: 07/14/2019 1:21:06 PM By: Deon Pilling Entered By: Deon Pilling on 07/14/2019 08:18:12 -------------------------------------------------------------------------------- Patient/Caregiver Education Details Patient Name: Date of Service: Mary Chaney 5/19/2021andnbsp8:00 A M Medical Record Number: [id number] Patient Account Number: [id number] Date of Birth/Gender: Treating RN: 24-Feb-1928 (86 y.o. [name] Primary Care Physician: PA Haig Prophet, [place] Other Clinician: Referring Physician: Treating Physician/Extender: Sharalyn Ink in Treatment: 2 Education Assessment Education Provided To: Patient Education Topics Provided Venous: Handouts: Managing Venous Disease and Related Ulcers Methods: Explain/Verbal Responses: Reinforcements needed Electronic Signature(s) Signed: 07/14/2019 1:21:06 PM By: Deon Pilling Entered By: Deon Pilling on 07/14/2019  08:18:01 -------------------------------------------------------------------------------- Wound Assessment Details Patient Name: Date of Service: Mary Chaney, Mary E. 07/14/2019 8:00 A M Medical Record Number: [id number] Patient Account Number: [id number] Date of Birth/Sex: Treating RN: 13-Feb-1928 (86 y.o. [name] Primary Care Artemis Loyal: PA Haig Prophet, [place] Other Clinician: Referring Jermika Olden: Treating Avice Funchess/Extender: Sharalyn Ink in Treatment: 2 Wound Status Wound Number: 1 Primary Etiology: Venous Leg Ulcer Wound Location: Right, Lateral Lower Leg Wound Status: Open Wounding Event: Gradually Appeared Date Acquired: 05/27/2019 Weeks Of Treatment: 2 Clustered Wound: No Wound Measurements Length: (cm) 1.3 Width: (cm) 0.4 Depth: (cm) 0.2 Area: (cm) 0.408 Volume: (cm) 0.082 % Reduction in Area:  87% % Reduction in Volume: 86.9% Wound Description Classification: Full Thickness Without Exposed Support Structu res Treatment Notes Wound #1 (Right, Lateral Lower Leg) 1. Cleanse With Wound Cleanser Soap and water 2. Periwound Care Moisturizing lotion 3. Primary Dressing Applied Hydrofera Blue 4. Secondary Dressing Dry Gauze 6. Support Layer Applied 3 layer compression wrap Notes netting. Electronic Signature(s) Signed: 07/14/2019 1:07:52 PM By: Zenaida Deed RN, BSN Signed: 07/14/2019 1:21:06 PM By: Shawn Stall Entered By: Shawn Stall on 07/14/2019 08:08:55 -------------------------------------------------------------------------------- Vitals Details Patient Name: Date of Service: Mary Seat E. 07/14/2019 8:00 A M Medical Record Number: [id number] Patient Account Number: [id number] Date of Birth/Sex: Treating RN: [birth year] (86 y.o. [name] Primary Care Tasman Zapata: PA Zenovia Jordan, [place] Other Clinician: Referring Yurika Pereda: Treating Loralyn Rachel/Extender: Skeet Simmer in Treatment: 2 Vital Signs Time Taken: 08:05 Temperature (F):  98 Height (in): 60 Pulse (bpm): 110 Weight (lbs): 125 Respiratory Rate (breaths/min): 20 Body Mass Index (BMI): 24.4 Blood Pressure (mmHg): 128/75 Reference Range: 80 - 120 mg / dl Electronic Signature(s) Signed: 07/14/2019 1:21:06 PM By: Shawn Stall Entered By: Shawn Stall on 07/14/2019 08:17:16

## 2019-07-21 ENCOUNTER — Encounter (HOSPITAL_BASED_OUTPATIENT_CLINIC_OR_DEPARTMENT_OTHER): Payer: Medicare Other | Admitting: Physician Assistant

## 2019-07-21 DIAGNOSIS — I480 Paroxysmal atrial fibrillation: Secondary | ICD-10-CM | POA: Diagnosis not present

## 2019-07-21 DIAGNOSIS — I890 Lymphedema, not elsewhere classified: Secondary | ICD-10-CM | POA: Diagnosis not present

## 2019-07-21 DIAGNOSIS — I5042 Chronic combined systolic (congestive) and diastolic (congestive) heart failure: Secondary | ICD-10-CM | POA: Diagnosis not present

## 2019-07-21 DIAGNOSIS — I872 Venous insufficiency (chronic) (peripheral): Secondary | ICD-10-CM | POA: Diagnosis not present

## 2019-07-21 DIAGNOSIS — I110 Hypertensive heart disease with heart failure: Secondary | ICD-10-CM | POA: Diagnosis not present

## 2019-07-21 DIAGNOSIS — L97812 Non-pressure chronic ulcer of other part of right lower leg with fat layer exposed: Secondary | ICD-10-CM | POA: Diagnosis not present

## 2019-07-21 NOTE — Progress Notes (Signed)
Mary Chaney, Mary Chaney ([id number]) Visit Report for 07/14/2019 SuperBill Details Patient Name: Date of Service: [name], [name] 07/14/2019 Medical Record Number: [id number] Patient Account Number: [id number] Date of Birth/Sex: Treating RN: [birth year] (86 y.o. [name] Primary Care Provider: PA Zenovia Jordan, [place] Other Clinician: Referring Provider: Treating Provider/Extender: Skeet Simmer in Treatment: 2 Diagnosis Coding ICD-10 Codes Code Description I87.2 Venous insufficiency (chronic) (peripheral) I89.0 Lymphedema, not elsewhere classified L97.812 Non-pressure chronic ulcer of other part of right lower leg with fat layer exposed I48.0 Paroxysmal atrial fibrillation I50.42 Chronic combined systolic (congestive) and diastolic (congestive) heart failure I10 Essential (primary) hypertension Z79.01 Long term (current) use of anticoagulants Facility Procedures CPT4 Code Description Modifier Quantity [id number] (Facility Use Only) [phone] - APPLY MULTLAY COMPRS LWR RT LEG 1 Electronic Signature(s) Signed: 07/14/2019 1:21:06 PM By: Shawn Stall Signed: 07/21/2019 12:40:59 PM By: Lenda Kelp PA-C Entered By: Shawn Stall on 07/14/2019 08:18:20

## 2019-07-21 NOTE — Progress Notes (Signed)
RYAH, CRIBB ([id number]) Visit Report for 07/07/2019 SuperBill Details Patient Name: Date of Service: Mary Chaney, Mary Chaney 07/07/2019 Medical Record Number: [id number] Patient Account Number: [id number] Date of Birth/Sex: Treating RN: 06-13-1927 (86 y.o. Wynelle Link Primary Care Provider: PA [place], [place] Other Clinician: Referring Provider: Treating Provider/Extender: Skeet Simmer in Treatment: 1 Diagnosis Coding ICD-10 Codes Code Description I87.2 Venous insufficiency (chronic) (peripheral) I89.0 Lymphedema, not elsewhere classified L97.812 Non-pressure chronic ulcer of other part of right lower leg with fat layer exposed I48.0 Paroxysmal atrial fibrillation I50.42 Chronic combined systolic (congestive) and diastolic (congestive) heart failure I10 Essential (primary) hypertension Z79.01 Long term (current) use of anticoagulants Facility Procedures CPT4 Code Description Modifier Quantity [phone] (Facility Use Only) [phone] - APPLY MULTLAY COMPRS LWR RT LEG 1 Electronic Signature(s) Signed: 07/12/2019 5:54:52 PM By: Zandra Abts RN, BSN Signed: 07/21/2019 12:40:59 PM By: Lenda Kelp PA-C Entered By: Zandra Abts on 07/10/2019 10:37:00

## 2019-07-21 NOTE — Progress Notes (Addendum)
Mary Chaney ([id number]) Visit Report for 07/21/2019 Chief Complaint Document Details Patient Name: Date of Service: Mary Chaney, Mary Chaney 07/21/2019 8:00 A M Medical Record Number: [id number] Patient Account Number: [id number] Date of Birth/Sex: Treating RN: 07/23/1927 (86 y.o. Mary Chaney Standard Primary Care Provider: PA Zenovia Jordan, [place] Other Clinician: Referring Provider: Treating Provider/Extender: Skeet Simmer in Treatment: 3 Information Obtained from: Patient Chief Complaint Right LE Ulcer Electronic Signature(s) Signed: 07/21/2019 8:21:21 AM By: Lenda Kelp PA-C Entered By: Lenda Kelp on 07/21/2019 08:21:20 -------------------------------------------------------------------------------- Debridement Details Patient Name: Date of Service: Mary Seat E. 07/21/2019 8:00 A M Medical Record Number: [id number] Patient Account Number: [id number] Date of Birth/Sex: Treating RN: 11/01/1927 (86 y.o. Mary Chaney Standard Primary Care Provider: PA Zenovia Jordan, [place] Other Clinician: Referring Provider: Treating Provider/Extender: Skeet Simmer in Treatment: 3 Debridement Performed for Assessment: Wound #1 Right,Lateral Lower Leg Performed By: Physician Lenda Kelp, PA Debridement Type: Debridement Severity of Tissue Pre Debridement: Fat layer exposed Level of Consciousness (Pre-procedure): Awake and Alert Pre-procedure Verification/Time Out Yes - 08:55 Taken: Start Time: 08:57 Pain Control: Lidocaine 5% topical ointment T Area Debrided (L x W): otal 1.3 (cm) x 0.6 (cm) = 0.78 (cm) Tissue and other material debrided: Skin: Epidermis, Fibrin/Exudate Level: Skin/Epidermis Debridement Description: Selective/Open Wound Instrument: Curette Bleeding: Minimum Hemostasis Achieved: Pressure End Time: 08:59 Procedural Pain: 4 Post Procedural Pain: 2 Response to Treatment: Procedure was tolerated well Level of Consciousness (Post- Awake and Alert procedure): Post  Debridement Measurements of Total Wound Length: (cm) 1.3 Width: (cm) 0.6 Depth: (cm) 0.2 Volume: (cm) 0.123 Character of Wound/Ulcer Post Debridement: Improved Severity of Tissue Post Debridement: Fat layer exposed Post Procedure Diagnosis Same as Pre-procedure Electronic Signature(s) Signed: 07/21/2019 1:28:30 PM By: Lenda Kelp PA-C Signed: 07/21/2019 2:12:16 PM By: Zenaida Deed RN, BSN Entered By: Zenaida Deed on 07/21/2019 08:59:33 -------------------------------------------------------------------------------- HPI Details Patient Name: Date of Service: Mary Seat E. 07/21/2019 8:00 A M Medical Record Number: [id number] Patient Account Number: [id number] Date of Birth/Sex: Treating RN: Mar 01, 1927 (86 y.o. Mary Chaney Standard Primary Care Provider: PA Zenovia Jordan, [place] Other Clinician: Referring Provider: Treating Provider/Extender: Skeet Simmer in Treatment: 3 History of Present Illness HPI Description: 06/30/2019 upon evaluation today patient presents for initial inspection here in our clinic concerning issues that she has been having with a ulcer on her right lateral lower extremity. She tells me that this has been present for about a month. She notes it came up initially as a blister that just showed up out of nowhere and then subsequently has worsened from that point. She has a lot of swelling and history of venous insufficiency. She also has what appears to be mild lymphedema and hemosiderin staining. The patient also has atrial fibrillation, is on long-term anticoagulant therapy, has hypertension, and congestive heart failure. Currently she tells me that the wound is giving her some trouble as far as pain is concerned but fortunately does not appear to be extremely painful which is good news. No fevers, chills, nausea, vomiting, or diarrhea. She has in the past worn compression socks but tells me its been about a year since she did. They are very difficult to get on  she tells me. 5/15; patient admitted to the clinic 10 days ago. She has a wound on her right lateral lower leg in the setting of severe chronic venous insufficiency with tightly adherent fibrous skin in her lower extremities. We have her with Hydrofera Blue and 3 layer  compression 07/21/2019 upon evaluation today patient actually appears to be doing quite well with regard to her leg ulcer. She has been tolerating the dressing changes without complication. Fortunately there is no signs of active infection at this time. Electronic Signature(s) Signed: 07/21/2019 9:03:33 AM By: Lenda Kelp PA-C Entered By: Lenda Kelp on 07/21/2019 09:03:33 -------------------------------------------------------------------------------- Physical Exam Details Patient Name: Date of Service: Mary Chaney 07/21/2019 8:00 A M Medical Record Number: [id number] Patient Account Number: [id number] Date of Birth/Sex: Treating RN: 1928-01-05 (86 y.o. Mary Chaney Standard Primary Care Provider: PA Zenovia Jordan, [place] Other Clinician: Referring Provider: Treating Provider/Extender: Skeet Simmer in Treatment: 3 Constitutional Well-nourished and well-hydrated in no acute distress. Respiratory normal breathing without difficulty. Psychiatric this patient is able to make decisions and demonstrates good insight into disease process. Alert and Oriented x 3. pleasant and cooperative. Notes Signs of good granulation at this point there is minimal dry skin around the edges of the wound that I did want to free up in order to prevent this from causing any trouble with healing. The patient tolerated that the light debridement today without complication post debridement the wound bed appears to be doing much better she is doing excellent with the compression wrap. Electronic Signature(s) Signed: 07/21/2019 9:03:49 AM By: Lenda Kelp PA-C Entered By: Lenda Kelp on 07/21/2019  09:03:49 -------------------------------------------------------------------------------- Physician Orders Details Patient Name: Date of Service: Mary Seat E. 07/21/2019 8:00 A M Medical Record Number: [id number] Patient Account Number: [id number] Date of Birth/Sex: Treating RN: [birth year] (86 y.o. Mary Chaney Standard Primary Care Provider: PA Zenovia Jordan, [place] Other Clinician: Referring Provider: Treating Provider/Extender: Skeet Simmer in Treatment: 3 Verbal / Phone Orders: No Diagnosis Coding ICD-10 Coding Code Description I87.2 Venous insufficiency (chronic) (peripheral) I89.0 Lymphedema, not elsewhere classified L97.812 Non-pressure chronic ulcer of other part of right lower leg with fat layer exposed I48.0 Paroxysmal atrial fibrillation I50.42 Chronic combined systolic (congestive) and diastolic (congestive) heart failure I10 Essential (primary) hypertension Z79.01 Long term (current) use of anticoagulants Follow-up Appointments Return Appointment in 1 week. Dressing Change Frequency Wound #1 Right,Lateral Lower Leg Do not change entire dressing for one week. Skin Barriers/Peri-Wound Care Moisturizing lotion - to leg Wound Cleansing Wound #1 Right,Lateral Lower Leg May shower with protection. Primary Wound Dressing Wound #1 Right,Lateral Lower Leg Hydrofera Blue - classic Secondary Dressing Wound #1 Right,Lateral Lower Leg Dry Gauze Edema Control 3 Layer Compression System - Right Lower Extremity Avoid standing for long periods of time Elevate legs to the level of the heart or above for 30 minutes daily and/or when sitting, a frequency of: - throughout the day Exercise regularly Support Garment 20-30 mm/Hg pressure to: - compression stocking to left leg daily Electronic Signature(s) Signed: 07/21/2019 1:28:30 PM By: Lenda Kelp PA-C Signed: 07/21/2019 2:12:16 PM By: Zenaida Deed RN, BSN Entered By: Zenaida Deed on 07/21/2019  09:00:28 -------------------------------------------------------------------------------- Problem List Details Patient Name: Date of Service: Mary Seat E. 07/21/2019 8:00 A M Medical Record Number: [id number] Patient Account Number: [id number] Date of Birth/Sex: Treating RN: [birth year] (86 y.o. Mary Chaney Standard Primary Care Provider: PA Zenovia Jordan, [place] Other Clinician: Referring Provider: Treating Provider/Extender: Skeet Simmer in Treatment: 3 Active Problems ICD-10 Encounter Code Description Active Date MDM Diagnosis I87.2 Venous insufficiency (chronic) (peripheral) 06/30/2019 No Yes I89.0 Lymphedema, not elsewhere classified 06/30/2019 No Yes L97.812 Non-pressure chronic ulcer of other part of right lower leg with fat layer 06/30/2019 No Yes exposed I48.0 Paroxysmal atrial  fibrillation 06/30/2019 No Yes I50.42 Chronic combined systolic (congestive) and diastolic (congestive) heart failure 06/30/2019 No Yes I10 Essential (primary) hypertension 06/30/2019 No Yes Z79.01 Long term (current) use of anticoagulants 06/30/2019 No Yes Inactive Problems Resolved Problems Electronic Signature(s) Signed: 07/21/2019 8:21:05 AM By: Worthy Keeler PA-C Entered By: Worthy Keeler on 07/21/2019 08:21:04 -------------------------------------------------------------------------------- Progress Note Details Patient Name: Date of Service: [name] 07/21/2019 8:00 A M Medical Record Number: [id number] Patient Account Number: [id number] Date of Birth/Sex: Treating RN: 16-Apr-1927 (86 y.o. [name] Primary Care Provider: PA Haig Prophet, [place] Other Clinician: Referring Provider: Treating Provider/Extender: Sharalyn Ink in Treatment: 3 Subjective Chief Complaint Information obtained from Patient Right LE Ulcer History of Present Illness (HPI) 06/30/2019 upon evaluation today patient presents for initial inspection here in our clinic concerning issues that she has been having  with a ulcer on her right lateral lower extremity. She tells me that this has been present for about a month. She notes it came up initially as a blister that just showed up out of nowhere and then subsequently has worsened from that point. She has a lot of swelling and history of venous insufficiency. She also has what appears to be mild lymphedema and hemosiderin staining. The patient also has atrial fibrillation, is on long-term anticoagulant therapy, has hypertension, and congestive heart failure. Currently she tells me that the wound is giving her some trouble as far as pain is concerned but fortunately does not appear to be extremely painful which is good news. No fevers, chills, nausea, vomiting, or diarrhea. She has in the past worn compression socks but tells me its been about a year since she did. They are very difficult to get on she tells me. 5/15; patient admitted to the clinic 10 days ago. She has a wound on her right lateral lower leg in the setting of severe chronic venous insufficiency with tightly adherent fibrous skin in her lower extremities. We have her with Hydrofera Blue and 3 layer compression 07/21/2019 upon evaluation today patient actually appears to be doing quite well with regard to her leg ulcer. She has been tolerating the dressing changes without complication. Fortunately there is no signs of active infection at this time. Objective Constitutional Well-nourished and well-hydrated in no acute distress. Vitals Time Taken: 8:08 AM, Height: 60 in, Weight: 125 lbs, BMI: 24.4, Temperature: 97.6 F, Pulse: 82 bpm, Respiratory Rate: 16 breaths/min, Blood Pressure: 115/68 mmHg. Respiratory normal breathing without difficulty. Psychiatric this patient is able to make decisions and demonstrates good insight into disease process. Alert and Oriented x 3. pleasant and cooperative. General Notes: Signs of good granulation at this point there is minimal dry skin around the edges  of the wound that I did want to free up in order to prevent this from causing any trouble with healing. The patient tolerated that the light debridement today without complication post debridement the wound bed appears to be doing much better she is doing excellent with the compression wrap. Integumentary (Hair, Skin) Wound #1 status is Open. Original cause of wound was Gradually Appeared. The wound is located on the Right,Lateral Lower Leg. The wound measures 1.3cm length x 0.6cm width x 0.2cm depth; 0.613cm^2 area and 0.123cm^3 volume. There is Fat Layer (Subcutaneous Tissue) Exposed exposed. There is no tunneling or undermining noted. There is a small amount of serosanguineous drainage noted. The wound margin is flat and intact. There is large (67-100%) pink granulation within the wound bed. There is a  small (1-33%) amount of necrotic tissue within the wound bed including Adherent Slough. Assessment Active Problems ICD-10 Venous insufficiency (chronic) (peripheral) Lymphedema, not elsewhere classified Non-pressure chronic ulcer of other part of right lower leg with fat layer exposed Paroxysmal atrial fibrillation Chronic combined systolic (congestive) and diastolic (congestive) heart failure Essential (primary) hypertension Long term (current) use of anticoagulants Procedures Wound #1 Pre-procedure diagnosis of Wound #1 is a Venous Leg Ulcer located on the Right,Lateral Lower Leg .Severity of Tissue Pre Debridement is: Fat layer exposed. There was a Selective/Open Wound Skin/Epidermis Debridement with a total area of 0.78 sq cm performed by Lenda Kelp, PA. With the following instrument(s): Curette Material removed includes Skin: Epidermis and Fibrin/Exudate and after achieving pain control using Lidocaine 5% topical ointment. No specimens were taken. A time out was conducted at 08:55, prior to the start of the procedure. A Minimum amount of bleeding was controlled with Pressure.  The procedure was tolerated well with a pain level of 4 throughout and a pain level of 2 following the procedure. Post Debridement Measurements: 1.3cm length x 0.6cm width x 0.2cm depth; 0.123cm^3 volume. Character of Wound/Ulcer Post Debridement is improved. Severity of Tissue Post Debridement is: Fat layer exposed. Post procedure Diagnosis Wound #1: Same as Pre-Procedure Pre-procedure diagnosis of Wound #1 is a Venous Leg Ulcer located on the Right,Lateral Lower Leg . There was a Three Layer Compression Therapy Procedure by Yevonne Pax, RN. Post procedure Diagnosis Wound #1: Same as Pre-Procedure Plan Follow-up Appointments: Return Appointment in 1 week. Dressing Change Frequency: Wound #1 Right,Lateral Lower Leg: Do not change entire dressing for one week. Skin Barriers/Peri-Wound Care: Moisturizing lotion - to leg Wound Cleansing: Wound #1 Right,Lateral Lower Leg: May shower with protection. Primary Wound Dressing: Wound #1 Right,Lateral Lower Leg: Hydrofera Blue - classic Secondary Dressing: Wound #1 Right,Lateral Lower Leg: Dry Gauze Edema Control: 3 Layer Compression System - Right Lower Extremity Avoid standing for long periods of time Elevate legs to the level of the heart or above for 30 minutes daily and/or when sitting, a frequency of: - throughout the day Exercise regularly Support Garment 20-30 mm/Hg pressure to: - compression stocking to left leg daily 1. I would recommend currently that we going to continue with the 3 layer compression wrap I do believe that is helpful for her. 2. I am also can recommend that we continue with the [facility] which I think has been of benefit. We will see patient back for reevaluation in 1 week here in the clinic. If anything worsens or changes patient will contact our office for additional recommendations. Electronic Signature(s) Signed: 07/21/2019 9:04:11 AM By: Lenda Kelp PA-C Entered By: Lenda Kelp on 07/21/2019  09:04:11 -------------------------------------------------------------------------------- SuperBill Details Patient Name: Date of Service: SIDNEE, GAMBRILL 07/21/2019 Medical Record Number: [id number] Patient Account Number: [id number] Date of Birth/Sex: Treating RN: 1927/08/20 (86 y.o. Mary Chaney Standard Primary Care Provider: PA Zenovia Jordan, [place] Other Clinician: Referring Provider: Treating Provider/Extender: Skeet Simmer in Treatment: 3 Diagnosis Coding ICD-10 Codes Code Description I87.2 Venous insufficiency (chronic) (peripheral) I89.0 Lymphedema, not elsewhere classified L97.812 Non-pressure chronic ulcer of other part of right lower leg with fat layer exposed I48.0 Paroxysmal atrial fibrillation I50.42 Chronic combined systolic (congestive) and diastolic (congestive) heart failure I10 Essential (primary) hypertension Z79.01 Long term (current) use of anticoagulants Facility Procedures CPT4 Code: [id number] Description: [phone] - DEBRIDE WOUND 1ST 20 SQ CM OR < ICD-10 Diagnosis Description L97.812 Non-pressure chronic ulcer of other part of right  lower leg with fat layer expos Modifier: ed Quantity: 1 Physician Procedures : CPT4 Code Description Modifier [id number] - WC PHYS DEBR WO ANESTH 20 SQ CM ICD-10 Diagnosis Description L97.812 Non-pressure chronic ulcer of other part of right lower leg with fat layer exposed Quantity: 1 Electronic Signature(s) Signed: 07/21/2019 9:04:17 AM By: Lenda KelpStone III, Vauda Salvucci PA-C Entered By: Lenda KelpStone III, Burnie Hank on 07/21/2019 09:04:17

## 2019-07-23 NOTE — Progress Notes (Signed)
SHANEIL, YAZDI ([id number]) Visit Report for 07/21/2019 Arrival Information Details Patient Name: Date of Service: Mary Chaney, Mary Chaney 07/21/2019 8:00 A M Medical Record Number: [id number] Patient Account Number: [id number] Date of Birth/Sex: Treating RN: 20-Sep-1927 (86 y.o. [name] Primary Care Aiva Miskell: PA TIENT, NO Other Clinician: Referring Daquann Merriott: Treating Mamie Diiorio/Extender: Sharalyn Ink in Treatment: 3 Visit Information History Since Last Visit Added or deleted any medications: No Patient Arrived: Cane Any new allergies or adverse reactions: No Arrival Time: 08:08 Had a fall or experienced change in No Accompanied By: alone activities of daily living that may affect Transfer Assistance: None risk of falls: Patient Identification Verified: Yes Signs or symptoms of abuse/neglect since last visito No Secondary Verification Process Completed: Yes Hospitalized since last visit: No Patient Requires Transmission-Based Precautions: No Implantable device outside of the clinic excluding No Patient Has Alerts: Yes cellular tissue based products placed in the center Patient Alerts: Patient on Blood Thinner since last visit: Has Dressing in Place as Prescribed: Yes Has Compression in Place as Prescribed: Yes Pain Present Now: No Electronic Signature(s) Signed: 07/22/2019 5:36:53 PM By: Levan Hurst RN, BSN Entered By: Levan Hurst on 07/21/2019 08:08:34 -------------------------------------------------------------------------------- Compression Therapy Details Patient Name: Date of Service: Mary Alm E. 07/21/2019 8:00 A M Medical Record Number: [id number] Patient Account Number: [id number] Date of Birth/Sex: Treating RN: [birth year] (86 y.o. [name] Primary Care Jajaira Ruis: PA Haig Prophet, [place] Other Clinician: Referring Joeline Freer: Treating Robbi Scurlock/Extender: Sharalyn Ink in Treatment: 3 Compression Therapy Performed for Wound Assessment:  Wound #1 Right,Lateral Lower Leg Performed By: Clinician Carlene Coria, RN Compression Type: Three Layer Post Procedure Diagnosis Same as Pre-procedure Electronic Signature(s) Signed: 07/21/2019 2:12:16 PM By: Baruch Gouty RN, BSN Entered By: Baruch Gouty on 07/21/2019 08:56:47 -------------------------------------------------------------------------------- Encounter Discharge Information Details Patient Name: Date of Service: Mary Alm E. 07/21/2019 8:00 A M Medical Record Number: [id number] Patient Account Number: [id number] Date of Birth/Sex: Treating RN: Nov 09, 1927 (86 y.o. [name] Primary Care Tamey Wanek: PA Haig Prophet, [place] Other Clinician: Referring Doyne Ellinger: Treating Rashid Whitenight/Extender: Sharalyn Ink in Treatment: 3 Encounter Discharge Information Items Post Procedure Vitals Discharge Condition: Stable Temperature (F): 97.6 Ambulatory Status: Cane Pulse (bpm): 72 Discharge Destination: Home Respiratory Rate (breaths/min): 18 Transportation: Private Auto Blood Pressure (mmHg): 115/68 Accompanied By: self Schedule Follow-up Appointment: Yes Clinical Summary of Care: Patient Declined Electronic Signature(s) Signed: 07/23/2019 9:26:18 AM By: Carlene Coria RN Entered By: Carlene Coria on 07/21/2019 09:16:39 -------------------------------------------------------------------------------- Lower Extremity Assessment Details Patient Name: Date of Service: Mary Chaney, Mary Chaney 07/21/2019 8:00 A M Medical Record Number: [id number] Patient Account Number: [id number] Date of Birth/Sex: Treating RN: 23-Jun-1927 (86 y.o. [name] Primary Care Liseth Wann: PA TIENT, NO Other Clinician: Referring Lisseth Brazeau: Treating Seymour Pavlak/Extender: Sharalyn Ink in Treatment: 3 Edema Assessment Assessed: [Left: No] [Right: No] Edema: [Left: Ye] [Right: s] Calf Left: Right: Point of Measurement: 28 cm From Medial Instep cm 32 cm Ankle Left: Right: Point of  Measurement: 11 cm From Medial Instep cm 19 cm Vascular Assessment Pulses: Dorsalis Pedis Palpable: [Right:Yes] Electronic Signature(s) Signed: 07/22/2019 5:36:53 PM By: Levan Hurst RN, BSN Entered By: Levan Hurst on 07/21/2019 08:11:11 -------------------------------------------------------------------------------- [place] Details Patient Name: Date of Service: Mary Alm E. 07/21/2019 8:00 A M Medical Record Number: [id number] Patient Account Number: [id number] Date of Birth/Sex: Treating RN: May 11, 1927 (86 y.o. [name] Primary Care Ahmadou Bolz: PA Haig Prophet, [place] Other Clinician: Referring Cerissa Zeiger: Treating Monzerat Handler/Extender: Sharalyn Ink in  Treatment: 3 Active Inactive Abuse / Safety / Falls / Self Care Management Nursing Diagnoses: Potential for falls Goals: Patient/caregiver will verbalize/demonstrate measures taken to prevent injury and/or falls Date Initiated: 06/30/2019 Target Resolution Date: 07/28/2019 Goal Status: Active Interventions: Assess fall risk on admission and as needed Notes: Venous Leg Ulcer Nursing Diagnoses: Knowledge deficit related to disease process and management Potential for venous Insuffiency (use before diagnosis confirmed) Goals: Patient will maintain optimal edema control Date Initiated: 06/30/2019 Target Resolution Date: 07/28/2019 Goal Status: Active Patient/caregiver will verbalize understanding of disease process and disease management Date Initiated: 06/30/2019 Target Resolution Date: 07/28/2019 Goal Status: Active Interventions: Assess peripheral edema status every visit. Compression as ordered Provide education on venous insufficiency Treatment Activities: Therapeutic compression applied : 06/30/2019 Notes: Wound/Skin Impairment Nursing Diagnoses: Impaired tissue integrity Knowledge deficit related to ulceration/compromised skin integrity Goals: Patient/caregiver will verbalize understanding  of skin care regimen Date Initiated: 06/30/2019 Target Resolution Date: 07/28/2019 Goal Status: Active Ulcer/skin breakdown will have a volume reduction of 30% by week 4 Date Initiated: 06/30/2019 Target Resolution Date: 07/28/2019 Goal Status: Active Interventions: Assess patient/caregiver ability to obtain necessary supplies Assess patient/caregiver ability to perform ulcer/skin care regimen upon admission and as needed Assess ulceration(s) every visit Treatment Activities: Skin care regimen initiated : 06/30/2019 Topical wound management initiated : 06/30/2019 Notes: Electronic Signature(s) Signed: 07/21/2019 2:12:16 PM By: Zenaida Deed RN, BSN Entered By: Zenaida Deed on 07/21/2019 08:52:35 -------------------------------------------------------------------------------- Pain Assessment Details Patient Name: Date of Service: Mary Seat E. 07/21/2019 8:00 A M Medical Record Number: [id number] Patient Account Number: [id number] Date of Birth/Sex: Treating RN: March 14, 1927 (86 y.o. Wynelle Link Primary Care Jospeh Mangel: PA Zenovia [place], [place] Other Clinician: Referring Deno Sida: Treating [place] Kinyon/Extender: Skeet Simmer in Treatment: 3 Active Problems Location of Pain Severity and Description of Pain Patient Has Paino No Site Locations Pain Management and Medication Current Pain Management: Electronic Signature(s) Signed: 07/22/2019 5:36:53 PM By: Zandra Abts RN, BSN Entered By: Zandra Abts on 07/21/2019 08:08:54 -------------------------------------------------------------------------------- Patient/Caregiver Education Details Patient Name: Date of Service: Mary Chaney 5/26/2021andnbsp8:00 A M Medical Record Number: [id number] Patient Account Number: [id number] Date of Birth/Gender: Treating RN: 1927/06/22 (86 y.o. [name] Primary Care Physician: PA Zenovia Jordan, [place] Other Clinician: Referring Physician: Treating Physician/Extender: Skeet Simmer  in Treatment: 3 Education Assessment Education Provided To: Patient Education Topics Provided Venous: Methods: Explain/Verbal Responses: Reinforcements needed, State content correctly Wound/Skin Impairment: Methods: Explain/Verbal Responses: Reinforcements needed, State content correctly Electronic Signature(s) Signed: 07/21/2019 2:12:16 PM By: Zenaida Deed RN, BSN Entered By: Zenaida Deed on 07/21/2019 08:53:11 -------------------------------------------------------------------------------- Wound Assessment Details Patient Name: Date of Service: Mary Seat E. 07/21/2019 8:00 A M Medical Record Number: [id number] Patient Account Number: [id number] Date of Birth/Sex: Treating RN: 03/05/1927 (86 y.o. Wynelle Link Primary Care Janey Petron: PA TIENT, NO Other Clinician: Referring Jeanise Durfey: Treating Leoncio Hansen/Extender: Skeet Simmer in Treatment: 3 Wound Status Wound Number: 1 Primary Etiology: Venous Leg Ulcer Wound Location: Right, Lateral Lower Leg Wound Status: Open Wounding Event: Gradually Appeared Comorbid History: Arrhythmia, Congestive Heart Failure, Hypertension Date Acquired: 05/27/2019 Weeks Of Treatment: 3 Clustered Wound: No Wound Measurements Length: (cm) 1.3 Width: (cm) 0.6 Depth: (cm) 0.2 Area: (cm) 0.613 Volume: (cm) 0.123 % Reduction in Area: 80.5% % Reduction in Volume: 80.4% Epithelialization: Medium (34-66%) Tunneling: No Undermining: No Wound Description Classification: Full Thickness Without Exposed Support Structures Wound Margin: Flat and Intact Exudate Amount: Small Exudate Type: Serosanguineous Exudate Color: red, brown Foul Odor After Cleansing: No Slough/Fibrino  Yes Wound Bed Granulation Amount: Large (67-100%) Exposed Structure Granulation Quality: Pink Fascia Exposed: No Necrotic Amount: Small (1-33%) Fat Layer (Subcutaneous Tissue) Exposed: Yes Necrotic Quality: Adherent Slough Tendon Exposed: No Muscle  Exposed: No Joint Exposed: No Bone Exposed: No Treatment Notes Wound #1 (Right, Lateral Lower Leg) 1. Cleanse With Wound Cleanser Soap and water 3. Primary Dressing Applied Hydrofera Blue 4. Secondary Dressing Dry Gauze 6. Support Layer Applied 3 layer compression wrap Notes netting. Electronic Signature(s) Signed: 07/22/2019 5:36:53 PM By: Zandra Abts RN, BSN Entered By: Zandra Abts on 07/21/2019 08:16:15 -------------------------------------------------------------------------------- Vitals Details Patient Name: Date of Service: Mary Seat E. 07/21/2019 8:00 A M Medical Record Number: [id number] Patient Account Number: [id number] Date of Birth/Sex: Treating RN: 1927/12/22 (86 y.o. Wynelle Link Primary Care Yarelli Decelles: PA TIENT, NO Other Clinician: Referring Kelvyn Schunk: Treating Clemencia Helzer/Extender: Skeet Simmer in Treatment: 3 Vital Signs Time Taken: 08:08 Temperature (F): 97.6 Height (in): 60 Pulse (bpm): 82 Weight (lbs): 125 Respiratory Rate (breaths/min): 16 Body Mass Index (BMI): 24.4 Blood Pressure (mmHg): 115/68 Reference Range: 80 - 120 mg / dl Electronic Signature(s) Signed: 07/22/2019 5:36:53 PM By: Zandra Abts RN, BSN Entered By: Zandra Abts on 07/21/2019 08:08:50

## 2019-07-28 ENCOUNTER — Encounter (HOSPITAL_BASED_OUTPATIENT_CLINIC_OR_DEPARTMENT_OTHER): Payer: Medicare Other | Attending: Physician Assistant | Admitting: Physician Assistant

## 2019-07-28 DIAGNOSIS — Z7901 Long term (current) use of anticoagulants: Secondary | ICD-10-CM | POA: Insufficient documentation

## 2019-07-28 DIAGNOSIS — R4182 Altered mental status, unspecified: Secondary | ICD-10-CM | POA: Diagnosis not present

## 2019-07-28 DIAGNOSIS — I5042 Chronic combined systolic (congestive) and diastolic (congestive) heart failure: Secondary | ICD-10-CM | POA: Insufficient documentation

## 2019-07-28 DIAGNOSIS — I480 Paroxysmal atrial fibrillation: Secondary | ICD-10-CM | POA: Diagnosis not present

## 2019-07-28 DIAGNOSIS — I110 Hypertensive heart disease with heart failure: Secondary | ICD-10-CM | POA: Diagnosis not present

## 2019-07-28 DIAGNOSIS — L299 Pruritus, unspecified: Secondary | ICD-10-CM | POA: Diagnosis not present

## 2019-07-28 DIAGNOSIS — L97812 Non-pressure chronic ulcer of other part of right lower leg with fat layer exposed: Secondary | ICD-10-CM | POA: Insufficient documentation

## 2019-07-28 DIAGNOSIS — I872 Venous insufficiency (chronic) (peripheral): Secondary | ICD-10-CM | POA: Insufficient documentation

## 2019-07-28 DIAGNOSIS — I890 Lymphedema, not elsewhere classified: Secondary | ICD-10-CM | POA: Insufficient documentation

## 2019-07-28 NOTE — Progress Notes (Addendum)
Mary Chaney, Mary Chaney ([id number]) Visit Report for 07/28/2019 Chief Complaint Document Details Patient Name: Date of Service: Mary Chaney 07/28/2019 8:00 A M Medical Record Number: [id number] Patient Account Number: [id number] Date of Birth/Sex: Treating RN: 01-12-1928 (86 y.o. [name] Primary Care Provider: PA Haig Prophet, [place] Other Clinician: Referring Provider: Treating Provider/Extender: Sharalyn Ink in Treatment: 4 Information Obtained from: Patient Chief Complaint Right LE Ulcer Electronic Signature(s) Signed: 07/28/2019 8:40:28 AM By: Worthy Keeler PA-C Entered By: Worthy Keeler on 07/28/2019 08:40:28 -------------------------------------------------------------------------------- HPI Details Patient Name: Date of Service: Mary Alm E. 07/28/2019 8:00 A M Medical Record Number: [id number] Patient Account Number: [id number] Date of Birth/Sex: Treating RN: [birth year] (86 y.o. [name] Primary Care Provider: PA Haig Prophet, [place] Other Clinician: Referring Provider: Treating Provider/Extender: Sharalyn Ink in Treatment: 4 History of Present Illness HPI Description: 06/30/2019 upon evaluation today patient presents for initial inspection here in our clinic concerning issues that she has been having with a ulcer on her right lateral lower extremity. She tells me that this has been present for about a month. She notes it came up initially as a blister that just showed up out of nowhere and then subsequently has worsened from that point. She has a lot of swelling and history of venous insufficiency. She also has what appears to be mild lymphedema and hemosiderin staining. The patient also has atrial fibrillation, is on long-term anticoagulant therapy, has hypertension, and congestive heart failure. Currently she tells me that the wound is giving her some trouble as far as pain is concerned but fortunately does not appear to be extremely painful which is good  news. No fevers, chills, nausea, vomiting, or diarrhea. She has in the past worn compression socks but tells me its been about a year since she did. They are very difficult to get on she tells me. 5/15; patient admitted to the clinic 10 days ago. She has a wound on her right lateral lower leg in the setting of severe chronic venous insufficiency with tightly adherent fibrous skin in her lower extremities. We have her with Hydrofera Blue and 3 layer compression 07/21/2019 upon evaluation today patient actually appears to be doing quite well with regard to her leg ulcer. She has been tolerating the dressing changes without complication. Fortunately there is no signs of active infection at this time. 07/28/2019 upon evaluation today patient actually appears to be doing quite well with regard to her leg ulcer. This in fact appears to be showing a lot of signs of new healing which is great news. Fortunately there is no signs of active infection which is also excellent. The Hydrofera Blue seems to be doing very well for her. She is having a lot of itching we may want to use a little bit of a steroid cream to try to help this out. Electronic Signature(s) Signed: 07/28/2019 9:05:53 AM By: Worthy Keeler PA-C Entered By: Worthy Keeler on 07/28/2019 09:05:53 -------------------------------------------------------------------------------- Physical Exam Details Patient Name: Date of Service: Mary Chaney, Mary Chaney 07/28/2019 8:00 A M Medical Record Number: [id number] Patient Account Number: [id number] Date of Birth/Sex: Treating RN: 1927-04-08 (86 y.o. [name] Primary Care Provider: PA Haig Prophet, [place] Other Clinician: Referring Provider: Treating Provider/Extender: Sharalyn Ink in Treatment: 4 Constitutional Well-nourished and well-hydrated in no acute distress. Respiratory normal breathing without difficulty. Psychiatric this patient is able to make decisions and demonstrates good insight into  disease process. Alert and Oriented x 3.  pleasant and cooperative. Notes Upon inspection patient's wound bed actually showed signs of good granulation at this time and excellent epithelization around the edges of the wound. Overall I feel like she is making great progress. She does have several blisters that appear to be reabsorbing on the left lower extremity but I do not see anything on the right that is significantly affected at this point. Electronic Signature(s) Signed: 07/28/2019 9:06:14 AM By: Lenda Kelp PA-C Entered By: Lenda Kelp on 07/28/2019 09:06:13 -------------------------------------------------------------------------------- Physician Orders Details Patient Name: Date of Service: Mary Seat E. 07/28/2019 8:00 A M Medical Record Number: [id number] Patient Account Number: [id number] Date of Birth/Sex: Treating RN: 09/09/1927 (86 y.o. [name] Primary Care Provider: PA Zenovia Jordan, [place] Other Clinician: Referring Provider: Treating Provider/Extender: Skeet Simmer in Treatment: 4 Verbal / Phone Orders: No Diagnosis Coding ICD-10 Coding Code Description I87.2 Venous insufficiency (chronic) (peripheral) I89.0 Lymphedema, not elsewhere classified L97.812 Non-pressure chronic ulcer of other part of right lower leg with fat layer exposed I48.0 Paroxysmal atrial fibrillation I50.42 Chronic combined systolic (congestive) and diastolic (congestive) heart failure I10 Essential (primary) hypertension Z79.01 Long term (current) use of anticoagulants Follow-up Appointments Return Appointment in 1 week. Dressing Change Frequency Wound #1 Right,Lateral Lower Leg Do not change entire dressing for one week. Skin Barriers/Peri-Wound Care Moisturizing lotion - to leg TCA Cream or Ointment - mix with lotion to leg under the wrap Wound Cleansing Wound #1 Right,Lateral Lower Leg May shower with protection. Primary Wound Dressing Wound #1 Right,Lateral Lower  Leg Hydrofera Blue - classic Secondary Dressing Wound #1 Right,Lateral Lower Leg Dry Gauze Edema Control 3 Layer Compression System - Right Lower Extremity Avoid standing for long periods of time Elevate legs to the level of the heart or above for 30 minutes daily and/or when sitting, a frequency of: - throughout the day Exercise regularly Support Garment 20-30 mm/Hg pressure to: - compression stocking to left leg daily Electronic Signature(s) Signed: 07/28/2019 4:50:50 PM By: Lenda Kelp PA-C Signed: 07/28/2019 5:52:33 PM By: Zenaida Deed RN, BSN Entered By: Zenaida Deed on 07/28/2019 09:03:53 -------------------------------------------------------------------------------- Problem List Details Patient Name: Date of Service: Mary Seat E. 07/28/2019 8:00 A M Medical Record Number: [id number] Patient Account Number: [id number] Date of Birth/Sex: Treating RN: 02-23-1928 (86 y.o. [place], [place] Primary Care Provider: PA Zenovia Jordan, [place] Other Clinician: Referring Provider: Treating Provider/Extender: Skeet Simmer in Treatment: 4 Active Problems ICD-10 Encounter Code Description Active Date MDM Diagnosis I87.2 Venous insufficiency (chronic) (peripheral) 06/30/2019 No Yes I89.0 Lymphedema, not elsewhere classified 06/30/2019 No Yes L97.812 Non-pressure chronic ulcer of other part of right lower leg with fat layer 06/30/2019 No Yes exposed I48.0 Paroxysmal atrial fibrillation 06/30/2019 No Yes I50.42 Chronic combined systolic (congestive) and diastolic (congestive) heart failure 06/30/2019 No Yes I10 Essential (primary) hypertension 06/30/2019 No Yes Z79.01 Long term (current) use of anticoagulants 06/30/2019 No Yes Inactive Problems Resolved Problems Electronic Signature(s) Signed: 07/28/2019 8:40:24 AM By: Lenda Kelp PA-C Entered By: Lenda Kelp on 07/28/2019 08:40:23 -------------------------------------------------------------------------------- Progress Note  Details Patient Name: Date of Service: Mary Seat E. 07/28/2019 8:00 A M Medical Record Number: [id number] Patient Account Number: [id number] Date of Birth/Sex: Treating RN: 08/31/1927 (86 y.o. [name] Primary Care Provider: PA Zenovia Jordan, [place] Other Clinician: Referring Provider: Treating Provider/Extender: Skeet Simmer in Treatment: 4 Subjective Chief Complaint Information obtained from Patient Right LE Ulcer History of Present Illness (HPI) 06/30/2019 upon evaluation today patient presents for initial  inspection here in our clinic concerning issues that she has been having with a ulcer on her right lateral lower extremity. She tells me that this has been present for about a month. She notes it came up initially as a blister that just showed up out of nowhere and then subsequently has worsened from that point. She has a lot of swelling and history of venous insufficiency. She also has what appears to be mild lymphedema and hemosiderin staining. The patient also has atrial fibrillation, is on long-term anticoagulant therapy, has hypertension, and congestive heart failure. Currently she tells me that the wound is giving her some trouble as far as pain is concerned but fortunately does not appear to be extremely painful which is good news. No fevers, chills, nausea, vomiting, or diarrhea. She has in the past worn compression socks but tells me its been about a year since she did. They are very difficult to get on she tells me. 5/15; patient admitted to the clinic 10 days ago. She has a wound on her right lateral lower leg in the setting of severe chronic venous insufficiency with tightly adherent fibrous skin in her lower extremities. We have her with Hydrofera Blue and 3 layer compression 07/21/2019 upon evaluation today patient actually appears to be doing quite well with regard to her leg ulcer. She has been tolerating the dressing changes without complication. Fortunately there  is no signs of active infection at this time. 07/28/2019 upon evaluation today patient actually appears to be doing quite well with regard to her leg ulcer. This in fact appears to be showing a lot of signs of new healing which is great news. Fortunately there is no signs of active infection which is also excellent. The Hydrofera Blue seems to be doing very well for her. She is having a lot of itching we may want to use a little bit of a steroid cream to try to help this out. Objective Constitutional Well-nourished and well-hydrated in no acute distress. Vitals Time Taken: 8:15 AM, Height: 60 in, Weight: 125 lbs, BMI: 24.4, Temperature: 97.9 F, Pulse: 83 bpm, Respiratory Rate: 18 breaths/min, Blood Pressure: 123/59 mmHg. Respiratory normal breathing without difficulty. Psychiatric this patient is able to make decisions and demonstrates good insight into disease process. Alert and Oriented x 3. pleasant and cooperative. General Notes: Upon inspection patient's wound bed actually showed signs of good granulation at this time and excellent epithelization around the edges of the wound. Overall I feel like she is making great progress. She does have several blisters that appear to be reabsorbing on the left lower extremity but I do not see anything on the right that is significantly affected at this point. Integumentary (Hair, Skin) Wound #1 status is Open. Original cause of wound was Gradually Appeared. The wound is located on the Right,Lateral Lower Leg. The wound measures 0.8cm length x 0.5cm width x 0.2cm depth; 0.314cm^2 area and 0.063cm^3 volume. There is Fat Layer (Subcutaneous Tissue) Exposed exposed. There is no tunneling or undermining noted. There is a small amount of serosanguineous drainage noted. The wound margin is well defined and not attached to the wound base. There is large (67-100%) pink granulation within the wound bed. There is a small (1-33%) amount of necrotic tissue within the  wound bed including Adherent Slough. Assessment Active Problems ICD-10 Venous insufficiency (chronic) (peripheral) Lymphedema, not elsewhere classified Non-pressure chronic ulcer of other part of right lower leg with fat layer exposed Paroxysmal atrial fibrillation Chronic combined systolic (congestive) and  diastolic (congestive) heart failure Essential (primary) hypertension Long term (current) use of anticoagulants Procedures Wound #1 Pre-procedure diagnosis of Wound #1 is a Venous Leg Ulcer located on the Right,Lateral Lower Leg . There was a Three Layer Compression Therapy Procedure by Yevonne Pax, RN. Post procedure Diagnosis Wound #1: Same as Pre-Procedure Plan Follow-up Appointments: Return Appointment in 1 week. Dressing Change Frequency: Wound #1 Right,Lateral Lower Leg: Do not change entire dressing for one week. Skin Barriers/Peri-Wound Care: Moisturizing lotion - to leg TCA Cream or Ointment - mix with lotion to leg under the wrap Wound Cleansing: Wound #1 Right,Lateral Lower Leg: May shower with protection. Primary Wound Dressing: Wound #1 Right,Lateral Lower Leg: Hydrofera Blue - classic Secondary Dressing: Wound #1 Right,Lateral Lower Leg: Dry Gauze Edema Control: 3 Layer Compression System - Right Lower Extremity Avoid standing for long periods of time Elevate legs to the level of the heart or above for 30 minutes daily and/or when sitting, a frequency of: - throughout the day Exercise regularly Support Garment 20-30 mm/Hg pressure to: - compression stocking to left leg daily 1. I would recommend currently that we go ahead and continue with the current wound care measures at this point. This includes continue with the [facility] which seems to be doing excellent for her. 2. I am also can recommend that we continue with the 3 layer compression wrap. 3. We will add triamcinolone mixed one-to-one with the lotion currently to the patient's right lower  extremity underneath the wrap in order to help with itching that she has been experiencing as well. 4. I would recommend she continue to elevate her legs as frequently as possible throughout the day. We will see patient back for reevaluation in 1 week here in the clinic. If anything worsens or changes patient will contact our office for additional recommendations. Electronic Signature(s) Signed: 07/28/2019 9:08:10 AM By: Lenda Kelp PA-C Entered By: Lenda Kelp on 07/28/2019 09:08:10 -------------------------------------------------------------------------------- SuperBill Details Patient Name: Date of Service: Mary Seat E. 07/28/2019 Medical Record Number: [id number] Patient Account Number: [id number] Date of Birth/Sex: Treating RN: 1928-01-17 (86 y.o. [name] Primary Care Provider: PA Zenovia Jordan, [place] Other Clinician: Referring Provider: Treating Provider/Extender: Skeet Simmer in Treatment: 4 Diagnosis Coding ICD-10 Codes Code Description I87.2 Venous insufficiency (chronic) (peripheral) I89.0 Lymphedema, not elsewhere classified L97.812 Non-pressure chronic ulcer of other part of right lower leg with fat layer exposed I48.0 Paroxysmal atrial fibrillation I50.42 Chronic combined systolic (congestive) and diastolic (congestive) heart failure I10 Essential (primary) hypertension Z79.01 Long term (current) use of anticoagulants Facility Procedures CPT4 Code: [id number] Description: (Facility Use Only) [phone] - APPLY MULTLAY COMPRS LWR RT LEG Modifier: Quantity: 1 Physician Procedures : CPT4 Code Description Modifier [id number] - WC PHYS LEVEL 3 - EST PT ICD-10 Diagnosis Description I87.2 Venous insufficiency (chronic) (peripheral) I89.0 Lymphedema, not elsewhere classified L97.812 Non-pressure chronic ulcer of other part of right  lower leg with fat layer exposed I48.0 Paroxysmal atrial fibrillation Quantity: 1 Electronic Signature(s) Signed: 07/28/2019  9:08:21 AM By: Lenda Kelp PA-C Entered By: Lenda Kelp on 07/28/2019 09:08:21

## 2019-07-28 NOTE — Progress Notes (Signed)
Meckel, Raley E. ([id number]) Visit Report for 07/28/2019 Arrival Information Details Patient Name: Date of Service: Bangerter, JA NNIE E. 07/28/2019 8:00 A M Medical Record Number: [id number] Patient Account Number: [id number] Date of Birth/Sex: Treating RN: 10/23/1927 (86 y.o. F) Dwiggins, Shannon Primary Care : PA TIENT, NO Other Clinician: Referring : Treating /Extender: Stone III, Hoyt Weeks in Treatment: 4 Visit Information History Since Last Visit Added or deleted any medications: No Patient Arrived: Cane Any new allergies or adverse reactions: No Arrival Time: 08:15 Had a fall or experienced change in No Accompanied By: family member activities of daily living that may affect Transfer Assistance: None risk of falls: Patient Identification Verified: Yes Signs or symptoms of abuse/neglect since last visito No Secondary Verification Process Completed: Yes Hospitalized since last visit: No Patient Requires Transmission-Based Precautions: No Implantable device outside of the clinic excluding No Patient Has Alerts: Yes cellular tissue based products placed in the center Patient Alerts: Patient on Blood Thinner since last visit: Has Dressing in Place as Prescribed: Yes Has Compression in Place as Prescribed: Yes Pain Present Now: No Electronic Signature(s) Signed: 07/28/2019 5:19:53 PM By: Dwiggins, Shannon Entered By: Dwiggins, Shannon on 07/28/2019 08:19:28 -------------------------------------------------------------------------------- Compression Therapy Details Patient Name: Date of Service: Biggs, JA NNIE E. 07/28/2019 8:00 A M Medical Record Number: [id number] Patient Account Number: [id number] Date of Birth/Sex: Treating RN: 02/27/1927 (86 y.o. F) Boehlein, Linda Primary Care : PA TIENT, NO Other Clinician: Referring : Treating /Extender: Stone III, Hoyt Weeks in Treatment: 4 Compression Therapy Performed for Wound Assessment:  Wound #1 Right,Lateral Lower Leg Performed By: Clinician Epps, Carrie, RN Compression Type: Three Layer Post Procedure Diagnosis Same as Pre-procedure Electronic Signature(s) Signed: 07/28/2019 5:52:33 PM By: Boehlein, Linda RN, BSN Entered By: Boehlein, Linda on 07/28/2019 09:01:02 -------------------------------------------------------------------------------- Encounter Discharge Information Details Patient Name: Date of Service: Hulon, JA NNIE E. 07/28/2019 8:00 A M Medical Record Number: [id number] Patient Account Number: [id number] Date of Birth/Sex: Treating RN: 04/22/1927 (86 y.o. F) Epps, Carrie Primary Care : PA TIENT, NO Other Clinician: Referring : Treating /Extender: Stone III, Hoyt Weeks in Treatment: 4 Encounter Discharge Information Items Discharge Condition: Stable Ambulatory Status: Cane Discharge Destination: Home Transportation: Private Auto Accompanied By: self Schedule Follow-up Appointment: Yes Clinical Summary of Care: Patient Declined Electronic Signature(s) Signed: 07/28/2019 4:36:43 PM By: Epps, Carrie RN Entered By: Epps, Carrie on 07/28/2019 09:30:25 -------------------------------------------------------------------------------- Lower Extremity Assessment Details Patient Name: Date of Service: Viscardi, JA NNIE E. 07/28/2019 8:00 A M Medical Record Number: [id number] Patient Account Number: [id number] Date of Birth/Sex: Treating RN: 10/15/1927 (86 y.o. F) Dwiggins, Shannon Primary Care : PA TIENT, NO Other Clinician: Referring : Treating /Extender: Stone III, Hoyt Weeks in Treatment: 4 Edema Assessment Assessed: [Left: No] [Right: No] Edema: [Left: Ye] [Right: s] Calf Left: Right: Point of Measurement: 28 cm From Medial Instep 34 cm 32.5 cm Ankle Left: Right: Point of Measurement: 11 cm From Medial Instep 19.5 cm 20 cm Vascular Assessment Pulses: Dorsalis Pedis Palpable: [Left:Yes]  [Right:Yes] Electronic Signature(s) Signed: 07/28/2019 5:19:53 PM By: Dwiggins, Shannon Entered By: Dwiggins, Shannon on 07/28/2019 08:31:29 -------------------------------------------------------------------------------- Multi-Disciplinary Care Plan Details Patient Name: Date of Service: Wollin, JA NNIE E. 07/28/2019 8:00 A M Medical Record Number: [id number] Patient Account Number: [id number] Date of Birth/Sex: Treating RN: 07/04/1927 (86 y.o. F) Boehlein, Linda Primary Care : PA TIENT, NO Other Clinician: Referring : Treating /Extender: Stone III, Hoyt Weeks in Treatment: 4 Active Inactive Venous Leg Ulcer Nursing Diagnoses: Knowledge deficit related to disease process and management   Potential for venous Insuffiency (use before diagnosis confirmed) Goals: Patient will maintain optimal edema control Date Initiated: 06/30/2019 Target Resolution Date: 08/25/2019 Goal Status: Active Patient/caregiver will verbalize understanding of disease process and disease management Date Initiated: 06/30/2019 Target Resolution Date: 08/25/2019 Goal Status: Active Interventions: Assess peripheral edema status every visit. Compression as ordered Provide education on venous insufficiency Treatment Activities: Therapeutic compression applied : 06/30/2019 Notes: Wound/Skin Impairment Nursing Diagnoses: Impaired tissue integrity Knowledge deficit related to ulceration/compromised skin integrity Goals: Patient/caregiver will verbalize understanding of skin care regimen Date Initiated: 06/30/2019 Target Resolution Date: 08/25/2019 Goal Status: Active Ulcer/skin breakdown will have a volume reduction of 30% by week 4 Date Initiated: 06/30/2019 Date Inactivated: 07/28/2019 Target Resolution Date: 07/28/2019 Goal Status: Met Ulcer/skin breakdown will have a volume reduction of 50% by week 8 Date Initiated: 07/28/2019 Target Resolution Date: 08/25/2019 Goal Status:  Active Interventions: Assess patient/caregiver ability to obtain necessary supplies Assess patient/caregiver ability to perform ulcer/skin care regimen upon admission and as needed Assess ulceration(s) every visit Treatment Activities: Skin care regimen initiated : 06/30/2019 Topical wound management initiated : 06/30/2019 Notes: Electronic Signature(s) Signed: 07/28/2019 5:52:33 PM By: Boehlein, Linda RN, BSN Entered By: Boehlein, Linda on 07/28/2019 09:00:23 -------------------------------------------------------------------------------- Pain Assessment Details Patient Name: Date of Service: Eschbach, JA NNIE E. 07/28/2019 8:00 A M Medical Record Number: [id number] Patient Account Number: [id number] Date of Birth/Sex: Treating RN: 08/07/1927 (86 y.o. F) Dwiggins, Shannon Primary Care : PA TIENT, NO Other Clinician: Referring : Treating /Extender: Stone III, Hoyt Weeks in Treatment: 4 Active Problems Location of Pain Severity and Description of Pain Patient Has Paino No Site Locations Pain Management and Medication Current Pain Management: Electronic Signature(s) Signed: 07/28/2019 5:19:53 PM By: Dwiggins, Shannon Entered By: Dwiggins, Shannon on 07/28/2019 08:19:36 -------------------------------------------------------------------------------- Patient/Caregiver Education Details Patient Name: Date of Service: Standre, JA NNIE E. 6/2/2021andnbsp8:00 A M Medical Record Number: [id number] Patient Account Number: [id number] Date of Birth/Gender: Treating RN: 01/25/1928 (86 y.o. F) Boehlein, Linda Primary Care Physician: PA TIENT, NO Other Clinician: Referring Physician: Treating Physician/Extender: Stone III, Hoyt Weeks in Treatment: 4 Education Assessment Education Provided To: Patient Education Topics Provided Venous: Methods: Explain/Verbal Responses: Reinforcements needed, State content correctly Wound/Skin Impairment: Methods:  Explain/Verbal Responses: Reinforcements needed, State content correctly Electronic Signature(s) Signed: 07/28/2019 5:52:33 PM By: Boehlein, Linda RN, BSN Entered By: Boehlein, Linda on 07/28/2019 09:00:43 -------------------------------------------------------------------------------- Wound Assessment Details Patient Name: Date of Service: Riggenbach, JA NNIE E. 07/28/2019 8:00 A M Medical Record Number: [id number] Patient Account Number: [id number] Date of Birth/Sex: Treating RN: 08/16/1927 (86 y.o. F) Dwiggins, Shannon Primary Care : PA TIENT, NO Other Clinician: Referring : Treating /Extender: Stone III, Hoyt Weeks in Treatment: 4 Wound Status Wound Number: 1 Primary Etiology: Venous Leg Ulcer Wound Location: Right, Lateral Lower Leg Wound Status: Open Wounding Event: Gradually Appeared Comorbid History: Arrhythmia, Congestive Heart Failure, Hypertension Date Acquired: 05/27/2019 Weeks Of Treatment: 4 Clustered Wound: No Wound Measurements Length: (cm) 0.8 Width: (cm) 0.5 Depth: (cm) 0.2 Area: (cm) 0.314 Volume: (cm) 0.063 % Reduction in Area: 90% % Reduction in Volume: 90% Epithelialization: Medium (34-66%) Tunneling: No Undermining: No Wound Description Classification: Full Thickness Without Exposed Support Structures Wound Margin: Well defined, not attached Exudate Amount: Small Exudate Type: Serosanguineous Exudate Color: red, brown Foul Odor After Cleansing: No Slough/Fibrino Yes Wound Bed Granulation Amount: Large (67-100%) Exposed Structure Granulation Quality: Pink Fascia Exposed: No Necrotic Amount: Small (1-33%) Fat Layer (Subcutaneous Tissue) Exposed: Yes Necrotic Quality: Adherent Slough Tendon Exposed: No Muscle Exposed: No Joint Exposed: No   Bone Exposed: No Treatment Notes Wound #1 (Right, Lateral Lower Leg) 1. Cleanse With Wound Cleanser Soap and water 2. Periwound Care TCA Cream 3. Primary Dressing Applied Hydrofera  Blue 4. Secondary Dressing Dry Gauze 6. Support Layer Applied 3 layer compression wrap Notes netting. Electronic Signature(s) Signed: 07/28/2019 5:19:53 PM By: Kela Millin Signed: 07/28/2019 5:52:33 PM By: Baruch Gouty RN, BSN Entered By: Baruch Gouty on 07/28/2019 09:04:33 -------------------------------------------------------------------------------- Vitals Details Patient Name: Date of Service: [name] 07/28/2019 8:00 A M Medical Record Number: [id number] Patient Account Number: [id number] Date of Birth/Sex: Treating RN: 07-28-1927 (86 y.o. Clearnce Sorrel Primary Care Rechelle Niebla: PA TIENT, NO Other Clinician: Referring Ikesha Siller: Treating Adiana Smelcer/Extender: Sharalyn Ink in Treatment: 4 Vital Signs Time Taken: 08:15 Temperature (F): 97.9 Height (in): 60 Pulse (bpm): 83 Weight (lbs): 125 Respiratory Rate (breaths/min): 18 Body Mass Index (BMI): 24.4 Blood Pressure (mmHg): 123/59 Reference Range: 80 - 120 mg / dl Electronic Signature(s) Signed: 07/28/2019 5:19:53 PM By: Kela Millin Entered By: Kela Millin on 07/28/2019 08:17:58

## 2019-08-04 ENCOUNTER — Encounter (HOSPITAL_BASED_OUTPATIENT_CLINIC_OR_DEPARTMENT_OTHER): Payer: Medicare Other | Admitting: Physician Assistant

## 2019-08-04 DIAGNOSIS — I872 Venous insufficiency (chronic) (peripheral): Secondary | ICD-10-CM | POA: Diagnosis not present

## 2019-08-04 DIAGNOSIS — I110 Hypertensive heart disease with heart failure: Secondary | ICD-10-CM | POA: Diagnosis not present

## 2019-08-04 DIAGNOSIS — I890 Lymphedema, not elsewhere classified: Secondary | ICD-10-CM | POA: Diagnosis not present

## 2019-08-04 DIAGNOSIS — L97812 Non-pressure chronic ulcer of other part of right lower leg with fat layer exposed: Secondary | ICD-10-CM | POA: Diagnosis not present

## 2019-08-04 NOTE — Progress Notes (Addendum)
Eden EmmsBRUNER, Leianna E. ([id number]) Visit Report for 08/04/2019 Chief Complaint Document Details Patient Name: Date of Service: Mary Chaney, Mary NNIE E. 08/04/2019 8:00 A M Medical Record Number: [id number] Patient Account Number: [id number] Date of Birth/Sex: Treating RN: June 17, 1927 (86 y.o. Tommye StandardF) Boehlein, Linda Primary Care Provider: PA Zenovia [place], [place] Other Clinician: Referring Provider: Treating Provider/Extender: Skeet SimmerStone III, Ambrea Hegler Weeks in Treatment: 5 Information Obtained from: Patient Chief Complaint Right LE Ulcer Electronic Signature(s) Signed: 08/04/2019 8:12:40 AM By: Lenda KelpStone III, Charlisa Cham PA-C Entered By: Lenda KelpStone III, Dashauna Heymann on 08/04/2019 08:12:39 -------------------------------------------------------------------------------- HPI Details Patient Name: Date of Service: Mary Chaney, Mary NNIE E. 08/04/2019 8:00 A M Medical Record Number: [id number] Patient Account Number: [id number] Date of Birth/Sex: Treating RN: June 17, 1927 (86 y.o. Tommye StandardF) Boehlein, Linda Primary Care Provider: PA Zenovia [place], [place] Other Clinician: Referring Provider: Treating Provider/Extender: Skeet SimmerStone III, Telma Pyeatt Weeks in Treatment: 5 History of Present Illness HPI Description: 06/30/2019 upon evaluation today patient presents for initial inspection here in our clinic concerning issues that she has been having with a ulcer on her right lateral lower extremity. She tells me that this has been present for about a month. She notes it came up initially as a blister that just showed up out of nowhere and then subsequently has worsened from that point. She has a lot of swelling and history of venous insufficiency. She also has what appears to be mild lymphedema and hemosiderin staining. The patient also has atrial fibrillation, is on long-term anticoagulant therapy, has hypertension, and congestive heart failure. Currently she tells me that the wound is giving her some trouble as far as pain is concerned but fortunately does not appear to be extremely painful which is good  news. No fevers, chills, nausea, vomiting, or diarrhea. She has in the past worn compression socks but tells me its been about a year since she did. They are very difficult to get on she tells me. 5/15; patient admitted to the clinic 10 days ago. She has a wound on her right lateral lower leg in the setting of severe chronic venous insufficiency with tightly adherent fibrous skin in her lower extremities. We have her with Hydrofera Blue and 3 layer compression 07/21/2019 upon evaluation today patient actually appears to be doing quite well with regard to her leg ulcer. She has been tolerating the dressing changes without complication. Fortunately there is no signs of active infection at this time. 07/28/2019 upon evaluation today patient actually appears to be doing quite well with regard to her leg ulcer. This in fact appears to be showing a lot of signs of new healing which is great news. Fortunately there is no signs of active infection which is also excellent. The Hydrofera Blue seems to be doing very well for her. She is having a lot of itching we may want to use a little bit of a steroid cream to try to help this out. 08/04/2019 on evaluation today patient appears to be doing better with regard to her wound. She has been tolerating the Hydrofera Blue and the wound does appear to be significantly improved which is great news. There is no signs of active infection at this time. Electronic Signature(s) Signed: 08/04/2019 8:25:16 AM By: Lenda KelpStone III, Geoffery Aultman PA-C Entered By: Lenda KelpStone III, Whittney Steenson on 08/04/2019 08:25:15 -------------------------------------------------------------------------------- Physical Exam Details Patient Name: Date of Service: Mary Chaney, Mary NNIE E. 08/04/2019 8:00 A M Medical Record Number: [id number] Patient Account Number: [id number] Date of Birth/Sex: Treating RN: June 17, 1927 (86 y.o. Tommye StandardF) Boehlein, Linda Primary Care Provider: PA Zenovia JordanIENT, [place] Other Clinician:  Referring Provider: Treating  Provider/Extender: Sharalyn Ink in Treatment: 5 Constitutional Well-nourished and well-hydrated in no acute distress. Respiratory normal breathing without difficulty. Psychiatric this patient is able to make decisions and demonstrates good insight into disease process. Alert and Oriented x 3. pleasant and cooperative. Notes His wound bed currently showed signs of good granulation and overall I am very pleased with how things seem to be progressing. Fortunately the patient is making wonderful progress I am very pleased and hopeful that she will be closed in the next 1-2 weeks completely as far as healing is concerned. Electronic Signature(s) Signed: 08/04/2019 8:25:45 AM By: Worthy Keeler PA-C Entered By: Worthy Keeler on 08/04/2019 08:25:44 -------------------------------------------------------------------------------- Physician Orders Details Patient Name: Date of Service: Mary Alm E. 08/04/2019 8:00 A M Medical Record Number: [id number] Patient Account Number: [id number] Date of Birth/Sex: Treating RN: [birth year] (86 y.o. [name] Primary Care Provider: PA Haig Prophet, [place] Other Clinician: Referring Provider: Treating Provider/Extender: Sharalyn Ink in Treatment: 5 Verbal / Phone Orders: No Diagnosis Coding ICD-10 Coding Code Description I87.2 Venous insufficiency (chronic) (peripheral) I89.0 Lymphedema, not elsewhere classified L97.812 Non-pressure chronic ulcer of other part of right lower leg with fat layer exposed I48.0 Paroxysmal atrial fibrillation I50.42 Chronic combined systolic (congestive) and diastolic (congestive) heart failure I10 Essential (primary) hypertension Z79.01 Long term (current) use of anticoagulants Follow-up Appointments Return Appointment in 1 week. Dressing Change Frequency Wound #1 Right,Lateral Lower Leg Do not change entire dressing for one week. Skin Barriers/Peri-Wound Care Moisturizing lotion - to leg TCA Cream  or Ointment - mix with lotion to leg under the wrap Wound Cleansing Wound #1 Right,Lateral Lower Leg May shower with protection. Primary Wound Dressing Wound #1 Right,Lateral Lower Leg Hydrofera Blue - classic Secondary Dressing Wound #1 Right,Lateral Lower Leg Dry Gauze Edema Control 3 Layer Compression System - Right Lower Extremity - not too tight by the toes Avoid standing for long periods of time Elevate legs to the level of the heart or above for 30 minutes daily and/or when sitting, a frequency of: - throughout the day Exercise regularly Support Garment 20-30 mm/Hg pressure to: - compression stocking to left leg daily Electronic Signature(s) Signed: 08/04/2019 6:08:32 PM By: Baruch Gouty RN, BSN Signed: 08/04/2019 6:29:31 PM By: Worthy Keeler PA-C Entered By: Baruch Gouty on 08/04/2019 08:20:58 -------------------------------------------------------------------------------- Problem List Details Patient Name: Date of Service: Mary Alm E. 08/04/2019 8:00 A M Medical Record Number: [id number] Patient Account Number: [id number] Date of Birth/Sex: Treating RN: March 19, 1927 (86 y.o. Martyn Malay, Linda Primary Care Provider: PA Haig Prophet, [place] Other Clinician: Referring Provider: Treating Provider/Extender: Sharalyn Ink in Treatment: 5 Active Problems ICD-10 Encounter Code Description Active Date MDM Diagnosis I87.2 Venous insufficiency (chronic) (peripheral) 06/30/2019 No Yes I89.0 Lymphedema, not elsewhere classified 06/30/2019 No Yes L97.812 Non-pressure chronic ulcer of other part of right lower leg with fat layer 06/30/2019 No Yes exposed I48.0 Paroxysmal atrial fibrillation 06/30/2019 No Yes I50.42 Chronic combined systolic (congestive) and diastolic (congestive) heart failure 06/30/2019 No Yes I10 Essential (primary) hypertension 06/30/2019 No Yes Z79.01 Long term (current) use of anticoagulants 06/30/2019 No Yes Inactive Problems Resolved Problems Electronic  Signature(s) Signed: 08/04/2019 8:12:34 AM By: Worthy Keeler PA-C Entered By: Worthy Keeler on 08/04/2019 08:12:34 -------------------------------------------------------------------------------- Progress Note Details Patient Name: Date of Service: Mary Alm E. 08/04/2019 8:00 A M Medical Record Number: [id number] Patient Account Number: [id number] Date of Birth/Sex: Treating RN: 07/22/1927 (86 y.o. F) Baruch Gouty Primary  Care Provider: PA Zenovia Jordan, [place] Other Clinician: Referring Provider: Treating Provider/Extender: Skeet Simmer in Treatment: 5 Subjective Chief Complaint Information obtained from Patient Right LE Ulcer History of Present Illness (HPI) 06/30/2019 upon evaluation today patient presents for initial inspection here in our clinic concerning issues that she has been having with a ulcer on her right lateral lower extremity. She tells me that this has been present for about a month. She notes it came up initially as a blister that just showed up out of nowhere and then subsequently has worsened from that point. She has a lot of swelling and history of venous insufficiency. She also has what appears to be mild lymphedema and hemosiderin staining. The patient also has atrial fibrillation, is on long-term anticoagulant therapy, has hypertension, and congestive heart failure. Currently she tells me that the wound is giving her some trouble as far as pain is concerned but fortunately does not appear to be extremely painful which is good news. No fevers, chills, nausea, vomiting, or diarrhea. She has in the past worn compression socks but tells me its been about a year since she did. They are very difficult to get on she tells me. 5/15; patient admitted to the clinic 10 days ago. She has a wound on her right lateral lower leg in the setting of severe chronic venous insufficiency with tightly adherent fibrous skin in her lower extremities. We have her with Hydrofera Blue and  3 layer compression 07/21/2019 upon evaluation today patient actually appears to be doing quite well with regard to her leg ulcer. She has been tolerating the dressing changes without complication. Fortunately there is no signs of active infection at this time. 07/28/2019 upon evaluation today patient actually appears to be doing quite well with regard to her leg ulcer. This in fact appears to be showing a lot of signs of new healing which is great news. Fortunately there is no signs of active infection which is also excellent. The Hydrofera Blue seems to be doing very well for her. She is having a lot of itching we may want to use a little bit of a steroid cream to try to help this out. 08/04/2019 on evaluation today patient appears to be doing better with regard to her wound. She has been tolerating the Hydrofera Blue and the wound does appear to be significantly improved which is great news. There is no signs of active infection at this time. Objective Constitutional Well-nourished and well-hydrated in no acute distress. Vitals Time Taken: 8:11 AM, Height: 60 in, Weight: 125 lbs, BMI: 24.4, Temperature: 97.8 F, Pulse: 78 bpm, Respiratory Rate: 16 breaths/min, Blood Pressure: 96/65 mmHg. Respiratory normal breathing without difficulty. Psychiatric this patient is able to make decisions and demonstrates good insight into disease process. Alert and Oriented x 3. pleasant and cooperative. General Notes: His wound bed currently showed signs of good granulation and overall I am very pleased with how things seem to be progressing. Fortunately the patient is making wonderful progress I am very pleased and hopeful that she will be closed in the next 1-2 weeks completely as far as healing is concerned. Integumentary (Hair, Skin) Wound #1 status is Open. Original cause of wound was Gradually Appeared. The wound is located on the Right,Lateral Lower Leg. The wound measures 0.6cm length x 0.3cm width x  0.1cm depth; 0.141cm^2 area and 0.014cm^3 volume. There is Fat Layer (Subcutaneous Tissue) Exposed exposed. There is no tunneling or undermining noted. There is a small amount of serosanguineous  drainage noted. The wound margin is well defined and not attached to the wound base. There is large (67-100%) red granulation within the wound bed. There is a small (1-33%) amount of necrotic tissue within the wound bed including Adherent Slough. Assessment Active Problems ICD-10 Venous insufficiency (chronic) (peripheral) Lymphedema, not elsewhere classified Non-pressure chronic ulcer of other part of right lower leg with fat layer exposed Paroxysmal atrial fibrillation Chronic combined systolic (congestive) and diastolic (congestive) heart failure Essential (primary) hypertension Long term (current) use of anticoagulants Procedures Wound #1 Pre-procedure diagnosis of Wound #1 is a Venous Leg Ulcer located on the Right,Lateral Lower Leg . There was a Three Layer Compression Therapy Procedure by Zenaida Deed, RN. Post procedure Diagnosis Wound #1: Same as Pre-Procedure Plan Follow-up Appointments: Return Appointment in 1 week. Dressing Change Frequency: Wound #1 Right,Lateral Lower Leg: Do not change entire dressing for one week. Skin Barriers/Peri-Wound Care: Moisturizing lotion - to leg TCA Cream or Ointment - mix with lotion to leg under the wrap Wound Cleansing: Wound #1 Right,Lateral Lower Leg: May shower with protection. Primary Wound Dressing: Wound #1 Right,Lateral Lower Leg: Hydrofera Blue - classic Secondary Dressing: Wound #1 Right,Lateral Lower Leg: Dry Gauze Edema Control: 3 Layer Compression System - Right Lower Extremity - not too tight by the toes Avoid standing for long periods of time Elevate legs to the level of the heart or above for 30 minutes daily and/or when sitting, a frequency of: - throughout the day Exercise regularly Support Garment 20-30 mm/Hg  pressure to: - compression stocking to left leg daily 1. I would recommend currently that we go ahead and continue with the [facility] dressing since that seems to be doing well the patient is in agreement the plan. 2. I am also can recommend at this time that we continue with the 3 layer compression wrap that we will wrap this a little bit more loosely so that it hopefully would not be too tight for her. If that still gives her trouble we may decrease to Curlex and Coban wrap I just take and make a big change when she is so close to healing. We will see patient back for reevaluation in 1 week here in the clinic. If anything worsens or changes patient will contact our office for additional recommendations. Electronic Signature(s) Signed: 08/04/2019 8:26:19 AM By: Lenda Kelp PA-C Entered By: Lenda Kelp on 08/04/2019 08:26:18 -------------------------------------------------------------------------------- SuperBill Details Patient Name: Date of Service: JACHELLE, FLUTY 08/04/2019 Medical Record Number: [id number] Patient Account Number: [id number] Date of Birth/Sex: Treating RN: [birth year] (86 y.o. [name] Primary Care Provider: PA Zenovia Jordan, [place] Other Clinician: Referring Provider: Treating Provider/Extender: Skeet Simmer in Treatment: 5 Diagnosis Coding ICD-10 Codes Code Description I87.2 Venous insufficiency (chronic) (peripheral) I89.0 Lymphedema, not elsewhere classified L97.812 Non-pressure chronic ulcer of other part of right lower leg with fat layer exposed I48.0 Paroxysmal atrial fibrillation I50.42 Chronic combined systolic (congestive) and diastolic (congestive) heart failure I10 Essential (primary) hypertension Z79.01 Long term (current) use of anticoagulants Facility Procedures CPT4 Code: [id number] Description: (Facility Use Only) [phone] - APPLY MULTLAY COMPRS LWR RT LEG Modifier: Quantity: 1 Physician Procedures : CPT4 Code Description  Modifier [id number] - WC PHYS LEVEL 3 - EST PT ICD-10 Diagnosis Description I87.2 Venous insufficiency (chronic) (peripheral) I89.0 Lymphedema, not elsewhere classified L97.812 Non-pressure chronic ulcer of other part of right  lower leg with fat layer exposed I48.0 Paroxysmal atrial fibrillation Quantity: 1 Electronic Signature(s) Signed: 08/04/2019 8:26:31 AM By:  Linwood Dibbles, Lessa Huge PA-C Entered By: Lenda Kelp on 08/04/2019 08:26:31

## 2019-08-11 ENCOUNTER — Other Ambulatory Visit: Payer: Self-pay

## 2019-08-11 ENCOUNTER — Encounter (HOSPITAL_BASED_OUTPATIENT_CLINIC_OR_DEPARTMENT_OTHER): Payer: Medicare Other | Admitting: Physician Assistant

## 2019-08-11 DIAGNOSIS — I890 Lymphedema, not elsewhere classified: Secondary | ICD-10-CM | POA: Diagnosis not present

## 2019-08-11 DIAGNOSIS — I110 Hypertensive heart disease with heart failure: Secondary | ICD-10-CM | POA: Diagnosis not present

## 2019-08-11 DIAGNOSIS — I872 Venous insufficiency (chronic) (peripheral): Secondary | ICD-10-CM | POA: Diagnosis not present

## 2019-08-11 DIAGNOSIS — L97812 Non-pressure chronic ulcer of other part of right lower leg with fat layer exposed: Secondary | ICD-10-CM | POA: Diagnosis not present

## 2019-08-11 NOTE — Progress Notes (Addendum)
Mary EmmsBRUNER, Mary E. ([id number]) Visit Report for 08/11/2019 Chief Complaint Document Details Patient Name: Date of Service: Mary MountsBRUNER, Mary NNIE E. 08/11/2019 2:30 PM Medical Record Number: [id number] Patient Account Number: [id number] Date of Birth/Sex: Treating Mary Chaney: October 17, 1927 (86 y.o. Mary StandardF) Boehlein, Mary Chaney Primary Care Provider: PA Mary [place], [place] Other Clinician: Referring Provider: Treating Provider/Extender: Skeet SimmerStone III, Edrei Norgaard Weeks in Treatment: 6 Information Obtained from: Patient Chief Complaint Right LE Ulcer Electronic Signature(s) Signed: 08/11/2019 2:49:39 PM By: Lenda KelpStone III, Contina Strain Mary Chaney-C Entered By: Lenda KelpStone III, Saryiah Bencosme on 08/11/2019 14:49:39 -------------------------------------------------------------------------------- HPI Details Patient Name: Date of Service: Mary SeatBRUNER, Mary NNIE E. 08/11/2019 2:30 PM Medical Record Number: [id number] Patient Account Number: [id number] Date of Birth/Sex: Treating Mary Chaney: October 17, 1927 (86 y.o. Mary StandardF) Boehlein, Mary Chaney Primary Care Provider: PA Mary [place]IENT, [place] Other Clinician: Referring Provider: Treating Provider/Extender: Skeet SimmerStone III, Norvella Loscalzo Weeks in Treatment: 6 History of Present Illness HPI Description: 06/30/2019 upon evaluation today patient presents for initial inspection here in our clinic concerning issues that she has been having with a ulcer on her right lateral lower extremity. She tells me that this has been present for about a month. She notes it came up initially as a blister that just showed up out of nowhere and then subsequently has worsened from that point. She has a lot of swelling and history of venous insufficiency. She also has what appears to be mild lymphedema and hemosiderin staining. The patient also has atrial fibrillation, is on long-term anticoagulant therapy, has hypertension, and congestive heart failure. Currently she tells me that the wound is giving her some trouble as far as pain is concerned but fortunately does not appear to be extremely painful which is good  news. No fevers, chills, nausea, vomiting, or diarrhea. She has in the past worn compression socks but tells me its been about a year since she did. They are very difficult to get on she tells me. 5/15; patient admitted to the clinic 10 days ago. She has a wound on her right lateral lower leg in the setting of severe chronic venous insufficiency with tightly adherent fibrous skin in her lower extremities. We have her with Hydrofera Blue and 3 layer compression 07/21/2019 upon evaluation today patient actually appears to be doing quite well with regard to her leg ulcer. She has been tolerating the dressing changes without complication. Fortunately there is no signs of active infection at this time. 07/28/2019 upon evaluation today patient actually appears to be doing quite well with regard to her leg ulcer. This in fact appears to be showing a lot of signs of new healing which is great news. Fortunately there is no signs of active infection which is also excellent. The Hydrofera Blue seems to be doing very well for her. She is having a lot of itching we may want to use a little bit of a steroid cream to try to help this out. 08/04/2019 on evaluation today patient appears to be doing better with regard to her wound. She has been tolerating the Hydrofera Blue and the wound does appear to be significantly improved which is great news. There is no signs of active infection at this time. 08/11/2019 on evaluation today patient actually appears to be doing fairly well with regard to her wound. Fortunately there is no signs of active infection the Hydrofera Blue is done best up to this point but I really think this may be healed today. Electronic Signature(s) Signed: 08/11/2019 3:21:24 PM By: Lenda KelpStone III, Nechelle Petrizzo Mary Chaney-C Entered By: Lenda KelpStone III, Xavien Dauphinais on 08/11/2019 15:21:23 -------------------------------------------------------------------------------- Physical  Exam Details Patient Name: Date of Service: Mary Chaney, Mary Chaney  08/11/2019 2:30 PM Medical Record Number: [id number] Patient Account Number: [id number] Date of Birth/Sex: Treating Mary Chaney: [birth year] (86 y.o. Mary Chaney Primary Care Provider: PA Mary Chaney, [place] Other Clinician: Referring Provider: Treating Provider/Extender: Skeet Simmer in Treatment: 6 Constitutional Well-nourished and well-hydrated in no acute distress. Respiratory normal breathing without difficulty. Psychiatric this patient is able to make decisions and demonstrates good insight into disease process. Alert and Oriented x 3. pleasant and cooperative. Notes Upon inspection patient's wound actually did show signs of what I believed to be complete epithelization after cleaning this very delicately today. With that being said I do not think that at this point she is quite ready for discharge there could still be some chance of the strains but for the most part I think the biggest issue is letting new skin toughen up as I do believe that it is completely healed most likely. Electronic Signature(s) Signed: 08/11/2019 3:21:58 PM By: Lenda Kelp Mary Chaney-C Entered By: Lenda Kelp on 08/11/2019 15:21:58 -------------------------------------------------------------------------------- Physician Orders Details Patient Name: Date of Service: Mary Chaney, Mary Chaney 08/11/2019 2:30 PM Medical Record Number: [id number] Patient Account Number: [id number] Date of Birth/Sex: Treating Mary Chaney: 12-Nov-1927 (86 y.o. Mary Chaney Primary Care Provider: PA Mary Chaney, NO Other Clinician: Referring Provider: Treating Provider/Extender: Skeet Simmer in Treatment: 6 Verbal / Phone Orders: No Diagnosis Coding ICD-10 Coding Code Description I87.2 Venous insufficiency (chronic) (peripheral) I89.0 Lymphedema, not elsewhere classified L97.812 Non-pressure chronic ulcer of other part of right lower leg with fat layer exposed I48.0 Paroxysmal atrial fibrillation I50.42 Chronic combined systolic  (congestive) and diastolic (congestive) heart failure I10 Essential (primary) hypertension Z79.01 Long term (current) use of anticoagulants Follow-up Appointments Return Appointment in 1 week. Dressing Change Frequency Wound #1 Right,Lateral Lower Leg Do not change entire dressing for one week. Skin Barriers/Peri-Wound Care Moisturizing lotion - to leg Wound Cleansing Wound #1 Right,Lateral Lower Leg May shower with protection. Primary Wound Dressing Wound #1 Right,Lateral Lower Leg Other: - gauze Edema Control 3 Layer Compression System - Right Lower Extremity - not too tight by the toes Avoid standing for long periods of time Elevate legs to the level of the heart or above for 30 minutes daily and/or when sitting, a frequency of: - throughout the day Exercise regularly Support Garment 20-30 mm/Hg pressure to: - compression stocking to left leg daily Electronic Signature(s) Signed: 08/12/2019 1:39:37 PM By: Lenda Kelp Mary Chaney-C Signed: 08/12/2019 5:22:52 PM By: Zenaida Deed Mary Chaney, BSN Entered By: Zenaida Deed on 08/11/2019 15:20:07 -------------------------------------------------------------------------------- Problem List Details Patient Name: Date of Service: Mary Chaney E. 08/11/2019 2:30 PM Medical Record Number: [id number] Patient Account Number: [id number] Date of Birth/Sex: Treating Mary Chaney: [birth year] (86 y.o. Mary Chaney, Mary Chaney Primary Care Provider: PA Mary Chaney, [place] Other Clinician: Referring Provider: Treating Provider/Extender: Skeet Simmer in Treatment: 6 Active Problems ICD-10 Encounter Code Description Active Date MDM Diagnosis I87.2 Venous insufficiency (chronic) (peripheral) 06/30/2019 No Yes I89.0 Lymphedema, not elsewhere classified 06/30/2019 No Yes L97.812 Non-pressure chronic ulcer of other part of right lower leg with fat layer 06/30/2019 No Yes exposed I48.0 Paroxysmal atrial fibrillation 06/30/2019 No Yes I50.42 Chronic combined systolic  (congestive) and diastolic (congestive) heart failure 06/30/2019 No Yes I10 Essential (primary) hypertension 06/30/2019 No Yes Z79.01 Long term (current) use of anticoagulants 06/30/2019 No Yes Inactive Problems Resolved Problems Electronic Signature(s) Signed: 08/11/2019 2:49:33 PM By: Lenda Kelp Mary Chaney-C Entered By: Lenda Kelp  on 08/11/2019 14:49:32 -------------------------------------------------------------------------------- Progress Note Details Patient Name: Date of Service: Mary Chaney, Mary Chaney 08/11/2019 2:30 PM Medical Record Number: [id number] Patient Account Number: [id number] Date of Birth/Sex: Treating Mary Chaney: 1927-07-02 (86 y.o. Mary Chaney Primary Care Provider: PA Mary Chaney, [place] Other Clinician: Referring Provider: Treating Provider/Extender: Skeet Simmer in Treatment: 6 Subjective Chief Complaint Information obtained from Patient Right LE Ulcer History of Present Illness (HPI) 06/30/2019 upon evaluation today patient presents for initial inspection here in our clinic concerning issues that she has been having with a ulcer on her right lateral lower extremity. She tells me that this has been present for about a month. She notes it came up initially as a blister that just showed up out of nowhere and then subsequently has worsened from that point. She has a lot of swelling and history of venous insufficiency. She also has what appears to be mild lymphedema and hemosiderin staining. The patient also has atrial fibrillation, is on long-term anticoagulant therapy, has hypertension, and congestive heart failure. Currently she tells me that the wound is giving her some trouble as far as pain is concerned but fortunately does not appear to be extremely painful which is good news. No fevers, chills, nausea, vomiting, or diarrhea. She has in the past worn compression socks but tells me its been about a year since she did. They are very difficult to get on she tells me. 5/15;  patient admitted to the clinic 10 days ago. She has a wound on her right lateral lower leg in the setting of severe chronic venous insufficiency with tightly adherent fibrous skin in her lower extremities. We have her with Hydrofera Blue and 3 layer compression 07/21/2019 upon evaluation today patient actually appears to be doing quite well with regard to her leg ulcer. She has been tolerating the dressing changes without complication. Fortunately there is no signs of active infection at this time. 07/28/2019 upon evaluation today patient actually appears to be doing quite well with regard to her leg ulcer. This in fact appears to be showing a lot of signs of new healing which is great news. Fortunately there is no signs of active infection which is also excellent. The Hydrofera Blue seems to be doing very well for her. She is having a lot of itching we may want to use a little bit of a steroid cream to try to help this out. 08/04/2019 on evaluation today patient appears to be doing better with regard to her wound. She has been tolerating the Hydrofera Blue and the wound does appear to be significantly improved which is great news. There is no signs of active infection at this time. 08/11/2019 on evaluation today patient actually appears to be doing fairly well with regard to her wound. Fortunately there is no signs of active infection the Hydrofera Blue is done best up to this point but I really think this may be healed today. Objective Constitutional Well-nourished and well-hydrated in no acute distress. Vitals Time Taken: 2:44 PM, Height: 60 in, Weight: 125 lbs, BMI: 24.4, Temperature: 97.9 F, Pulse: 76 bpm, Respiratory Rate: 16 breaths/min, Blood Pressure: 127/78 mmHg. Respiratory normal breathing without difficulty. Psychiatric this patient is able to make decisions and demonstrates good insight into disease process. Alert and Oriented x 3. pleasant and cooperative. General Notes: Upon  inspection patient's wound actually did show signs of what I believed to be complete epithelization after cleaning this very delicately today. With that being said I do not think that  at this point she is quite ready for discharge there could still be some chance of the strains but for the most part I think the biggest issue is letting new skin toughen up as I do believe that it is completely healed most likely. Integumentary (Hair, Skin) Wound #1 status is Open. Original cause of wound was Gradually Appeared. The wound is located on the Right,Lateral Lower Leg. The wound measures 0.2cm length x 0.2cm width x 0.1cm depth; 0.031cm^2 area and 0.003cm^3 volume. There is Fat Layer (Subcutaneous Tissue) Exposed exposed. There is no tunneling or undermining noted. There is a small amount of serosanguineous drainage noted. The wound margin is well defined and not attached to the wound base. There is large (67-100%) red granulation within the wound bed. There is a small (1-33%) amount of necrotic tissue within the wound bed including Adherent Slough. Assessment Active Problems ICD-10 Venous insufficiency (chronic) (peripheral) Lymphedema, not elsewhere classified Non-pressure chronic ulcer of other part of right lower leg with fat layer exposed Paroxysmal atrial fibrillation Chronic combined systolic (congestive) and diastolic (congestive) heart failure Essential (primary) hypertension Long term (current) use of anticoagulants Procedures Wound #1 Pre-procedure diagnosis of Wound #1 is a Venous Leg Ulcer located on the Right,Lateral Lower Leg . There was a Three Layer Compression Therapy Procedure by Mary Coria, Mary Chaney. Post procedure Diagnosis Wound #1: Same as Pre-Procedure Plan Follow-up Appointments: Return Appointment in 1 week. Dressing Change Frequency: Wound #1 Right,Lateral Lower Leg: Do not change entire dressing for one week. Skin Barriers/Peri-Wound Care: Moisturizing lotion - to  leg Wound Cleansing: Wound #1 Right,Lateral Lower Leg: May shower with protection. Primary Wound Dressing: Wound #1 Right,Lateral Lower Leg: Other: - gauze Edema Control: 3 Layer Compression System - Right Lower Extremity - not too tight by the toes Avoid standing for long periods of time Elevate legs to the level of the heart or above for 30 minutes daily and/or when sitting, a frequency of: - throughout the day Exercise regularly Support Garment 20-30 mm/Hg pressure to: - compression stocking to left leg daily 1. I would recommend currently that we continue to wrap this patient for the time being I do believe that the compression wrap is beneficial for her and hopefully should help her to completely resolve this wound. 2. I am also going to recommend at this time that the patient needs to continue to monitor for any signs of infection. Obviously we do not want anything worsening again I do not believe she is good to be able to see much with the wrap on obviously is more feelings if she has any pain or issues. We will see patient back for reevaluation in 1 week here in the clinic. If anything worsens or changes patient will contact our office for additional recommendations. She does have a compression stocking for once this heals. Electronic Signature(s) Signed: 08/11/2019 3:23:25 PM By: Worthy Keeler Mary Chaney-C Entered By: Worthy Keeler on 08/11/2019 15:23:24 -------------------------------------------------------------------------------- SuperBill Details Patient Name: Date of Service: [name] 08/11/2019 Medical Record Number: [id number] Patient Account Number: [id number] Date of Birth/Sex: Treating Mary Chaney: Jan 01, 1928 (86 y.o. [name] Primary Care Provider: PA Haig Prophet, [place] Other Clinician: Referring Provider: Treating Provider/Extender: Sharalyn Ink in Treatment: 6 Diagnosis Coding ICD-10 Codes Code Description I87.2 Venous insufficiency (chronic)  (peripheral) I89.0 Lymphedema, not elsewhere classified L97.812 Non-pressure chronic ulcer of other part of right lower leg with fat layer exposed I48.0 Paroxysmal atrial fibrillation I50.42 Chronic combined systolic (congestive) and diastolic (congestive)  heart failure I10 Essential (primary) hypertension Z79.01 Long term (current) use of anticoagulants Facility Procedures CPT4 Code: [id number] Description: (Facility Use Only) [phone] - APPLY MULTLAY COMPRS LWR RT LEG Modifier: Quantity: 1 Physician Procedures : CPT4 Code Description Modifier [id number] - WC PHYS LEVEL 3 - EST PT ICD-10 Diagnosis Description I87.2 Venous insufficiency (chronic) (peripheral) I89.0 Lymphedema, not elsewhere classified L97.812 Non-pressure chronic ulcer of other part of right  lower leg with fat layer exposed I48.0 Paroxysmal atrial fibrillation Quantity: 1 Electronic Signature(s) Signed: 08/11/2019 3:23:43 PM By: Lenda Kelp Mary Chaney-C Entered By: Lenda Kelp on 08/11/2019 15:23:42

## 2019-08-12 NOTE — Progress Notes (Signed)
TARIKA, MCKETHAN ([id number]) Visit Report for 08/11/2019 Arrival Information Details Patient Name: Date of Service: TAHJ, LINDSETH 08/11/2019 2:30 PM Medical Record Number: [id number] Patient Account Number: [id number] Date of Birth/Sex: Treating RN: 1927/03/02 (86 y.o. [name] Primary Care Kyo Cocuzza: PA Haig Prophet, [place] Other Clinician: Referring Odie Edmonds: Treating Saketh Daubert/Extender: Sharalyn Ink in Treatment: 6 Visit Information History Since Last Visit Added or deleted any medications: No Patient Arrived: Cane Any new allergies or adverse reactions: No Arrival Time: 14:43 Had a fall or experienced change in No Accompanied By: granddaughter activities of daily living that may affect Transfer Assistance: None risk of falls: Patient Identification Verified: Yes Signs or symptoms of abuse/neglect since last visito No Secondary Verification Process Completed: Yes Hospitalized since last visit: No Patient Requires Transmission-Based Precautions: No Implantable device outside of the clinic excluding No Patient Has Alerts: Yes cellular tissue based products placed in the center Patient Alerts: Patient on Blood Thinner since last visit: Has Dressing in Place as Prescribed: Yes Pain Present Now: No Electronic Signature(s) Signed: 08/12/2019 12:45:44 PM By: Sandre Kitty Entered By: Sandre Kitty on 08/11/2019 14:44:28 -------------------------------------------------------------------------------- Compression Therapy Details Patient Name: Date of Service: TAREA, SKILLMAN E. 08/11/2019 2:30 PM Medical Record Number: [id number] Patient Account Number: [id number] Date of Birth/Sex: Treating RN: 07-01-1927 (86 y.o. [name] Primary Care Lakin Romer: PA Haig Prophet, [place] Other Clinician: Referring Bodie Abernethy: Treating Thomes Burak/Extender: Sharalyn Ink in Treatment: 6 Compression Therapy Performed for Wound Assessment: Wound #1 Right,Lateral Lower Leg Performed  By: Clinician Carlene Coria, RN Compression Type: Three Layer Post Procedure Diagnosis Same as Pre-procedure Electronic Signature(s) Signed: 08/12/2019 5:22:52 PM By: Baruch Gouty RN, BSN Entered By: Baruch Gouty on 08/11/2019 15:16:04 -------------------------------------------------------------------------------- Encounter Discharge Information Details Patient Name: Date of Service: [name] 08/11/2019 2:30 PM Medical Record Number: [id number] Patient Account Number: [id number] Date of Birth/Sex: Treating RN: 1927/06/19 (86 y.o. [name] Primary Care Terrilynn Postell: PA Haig Prophet, [place] Other Clinician: Referring Ahren Pettinger: Treating Deran Barro/Extender: Sharalyn Ink in Treatment: 6 Encounter Discharge Information Items Discharge Condition: Stable Ambulatory Status: Cane Discharge Destination: Home Transportation: Private Auto Accompanied By: self Schedule Follow-up Appointment: Yes Clinical Summary of Care: Patient Declined Electronic Signature(s) Signed: 08/11/2019 4:55:19 PM By: Carlene Coria RN Entered By: Carlene Coria on 08/11/2019 15:39:32 -------------------------------------------------------------------------------- Lower Extremity Assessment Details Patient Name: Date of Service: TATYANA, BIBER 08/11/2019 2:30 PM Medical Record Number: [id number] Patient Account Number: [id number] Date of Birth/Sex: Treating RN: 1927/10/12 (86 y.o. Debby Bud Primary Care Selso Mannor: PA TIENT, NO Other Clinician: Referring Gabrille Kilbride: Treating Jeni Duling/Extender: Sharalyn Ink in Treatment: 6 Edema Assessment Assessed: [Left: No] [Right: Yes] Edema: [Left: N] [Right: o] Calf Left: Right: Point of Measurement: 28 cm From Medial Instep 34 cm 31.5 cm Ankle Left: Right: Point of Measurement: 11 cm From Medial Instep 19.5 cm 18 cm Vascular Assessment Pulses: Dorsalis Pedis Palpable: [Right:Yes] Electronic Signature(s) Signed: 08/11/2019 5:03:23 PM By:  Deon Pilling Entered By: Deon Pilling on 08/11/2019 14:48:01 -------------------------------------------------------------------------------- Multi-Disciplinary Care Plan Details Patient Name: Date of Service: Darolyn Rua NNIE E. 08/11/2019 2:30 PM Medical Record Number: [id number] Patient Account Number: [id number] Date of Birth/Sex: Treating RN: 12-13-1927 (86 y.o. [name] Primary Care Kaige Whistler: PA Haig Prophet, [place] Other Clinician: Referring Rosina Cressler: Treating Cherri Yera/Extender: Sharalyn Ink in Treatment: 6 Active Inactive Venous Leg Ulcer Nursing Diagnoses: Knowledge deficit related to disease process and management Potential for venous Insuffiency (use before diagnosis confirmed) Goals: Patient will maintain optimal edema  control Date Initiated: 06/30/2019 Target Resolution Date: 08/25/2019 Goal Status: Active Patient/caregiver will verbalize understanding of disease process and disease management Date Initiated: 06/30/2019 Target Resolution Date: 08/25/2019 Goal Status: Active Interventions: Assess peripheral edema status every visit. Compression as ordered Provide education on venous insufficiency Treatment Activities: Therapeutic compression applied : 06/30/2019 Notes: Wound/Skin Impairment Nursing Diagnoses: Impaired tissue integrity Knowledge deficit related to ulceration/compromised skin integrity Goals: Patient/caregiver will verbalize understanding of skin care regimen Date Initiated: 06/30/2019 Target Resolution Date: 08/25/2019 Goal Status: Active Ulcer/skin breakdown will have a volume reduction of 30% by week 4 Date Initiated: 06/30/2019 Date Inactivated: 07/28/2019 Target Resolution Date: 07/28/2019 Goal Status: Met Ulcer/skin breakdown will have a volume reduction of 50% by week 8 Date Initiated: 07/28/2019 Target Resolution Date: 08/25/2019 Goal Status: Active Interventions: Assess patient/caregiver ability to obtain necessary supplies Assess  patient/caregiver ability to perform ulcer/skin care regimen upon admission and as needed Assess ulceration(s) every visit Treatment Activities: Skin care regimen initiated : 06/30/2019 Topical wound management initiated : 06/30/2019 Notes: Electronic Signature(s) Signed: 08/12/2019 5:22:52 PM By: Baruch Gouty RN, BSN Entered By: Baruch Gouty on 08/11/2019 15:14:52 -------------------------------------------------------------------------------- Pain Assessment Details Patient Name: Date of Service: [name] 08/11/2019 2:30 PM Medical Record Number: [id number] Patient Account Number: [id number] Date of Birth/Sex: Treating RN: 26-Jan-1928 (86 y.o. [name] Primary Care Lestat Golob: PA Haig Prophet, [place] Other Clinician: Referring Lacye Mccarn: Treating Sigurd Pugh/Extender: Sharalyn Ink in Treatment: 6 Active Problems Location of Pain Severity and Description of Pain Patient Has Paino No Site Locations Pain Management and Medication Current Pain Management: Electronic Signature(s) Signed: 08/12/2019 12:45:44 PM By: Sandre Kitty Signed: 08/12/2019 5:22:52 PM By: Baruch Gouty RN, BSN Entered By: Sandre Kitty on 08/11/2019 14:44:53 -------------------------------------------------------------------------------- Patient/Caregiver Education Details Patient Name: Date of Service: Hale Drone 6/16/2021andnbsp2:30 PM Medical Record Number: [id number] Patient Account Number: [id number] Date of Birth/Gender: Treating RN: [birth year] (86 y.o. [name] Primary Care Physician: PA Haig Prophet, [place] Other Clinician: Referring Physician: Treating Physician/Extender: Sharalyn Ink in Treatment: 6 Education Assessment Education Provided To: Patient Education Topics Provided Venous: Methods: Explain/Verbal Responses: Reinforcements needed, State content correctly Electronic Signature(s) Signed: 08/12/2019 5:22:52 PM By: Baruch Gouty RN, BSN Entered  By: Baruch Gouty on 08/11/2019 15:15:07 -------------------------------------------------------------------------------- Wound Assessment Details Patient Name: Date of Service: [name] 08/11/2019 2:30 PM Medical Record Number: [id number] Patient Account Number: [id number] Date of Birth/Sex: Treating RN: 03-05-1927 (86 y.o. Helene Shoe, Meta.Reding Primary Care Lukasz Rogus: PA TIENT, NO Other Clinician: Referring Kristalynn Coddington: Treating Sheri Prows/Extender: Sharalyn Ink in Treatment: 6 Wound Status Wound Number: 1 Primary Etiology: Venous Leg Ulcer Wound Location: Right, Lateral Lower Leg Wound Status: Open Wounding Event: Gradually Appeared Comorbid History: Arrhythmia, Congestive Heart Failure, Hypertension Date Acquired: 05/27/2019 Weeks Of Treatment: 6 Clustered Wound: No Wound Measurements Length: (cm) 0.2 Width: (cm) 0.2 Depth: (cm) 0.1 Area: (cm) 0.031 Volume: (cm) 0.003 % Reduction in Area: 99% % Reduction in Volume: 99.5% Epithelialization: Large (67-100%) Tunneling: No Undermining: No Wound Description Classification: Full Thickness Without Exposed Support Structures Wound Margin: Well defined, not attached Exudate Amount: Small Exudate Type: Serosanguineous Exudate Color: red, brown Foul Odor After Cleansing: No Slough/Fibrino Yes Wound Bed Granulation Amount: Large (67-100%) Exposed Structure Granulation Quality: Red Fascia Exposed: No Necrotic Amount: Small (1-33%) Fat Layer (Subcutaneous Tissue) Exposed: Yes Necrotic Quality: Adherent Slough Tendon Exposed: No Muscle Exposed: No Joint Exposed: No Bone Exposed: No Treatment Notes Wound #1 (Right, Lateral Lower Leg) 1. Cleanse With Wound Cleanser Soap and  water 2. Periwound Care Moisturizing lotion 6. Support Layer Applied 3 layer compression wrap Notes netting. Electronic Signature(s) Signed: 08/11/2019 5:03:23 PM By: Deon Pilling Entered By: Deon Pilling on 08/11/2019  14:50:24 -------------------------------------------------------------------------------- Vitals Details Patient Name: Date of Service: [name] 08/11/2019 2:30 PM Medical Record Number: [id number] Patient Account Number: [id number] Date of Birth/Sex: Treating RN: 07/22/1927 (86 y.o. [name] Primary Care Lam Mccubbins: PA Haig Prophet, [place] Other Clinician: Referring Hollin Crewe: Treating Cellie Dardis/Extender: Sharalyn Ink in Treatment: 6 Vital Signs Time Taken: 14:44 Temperature (F): 97.9 Height (in): 60 Pulse (bpm): 76 Weight (lbs): 125 Respiratory Rate (breaths/min): 16 Body Mass Index (BMI): 24.4 Blood Pressure (mmHg): 127/78 Reference Range: 80 - 120 mg / dl Electronic Signature(s) Signed: 08/12/2019 12:45:44 PM By: Sandre Kitty Entered By: Sandre Kitty on 08/11/2019 14:44:47

## 2019-08-13 ENCOUNTER — Ambulatory Visit: Payer: BC Managed Care – PPO | Admitting: Podiatry

## 2019-08-17 ENCOUNTER — Ambulatory Visit: Payer: BC Managed Care – PPO | Admitting: Sports Medicine

## 2019-08-18 ENCOUNTER — Encounter (HOSPITAL_BASED_OUTPATIENT_CLINIC_OR_DEPARTMENT_OTHER): Payer: Medicare Other | Admitting: Physician Assistant

## 2019-08-18 DIAGNOSIS — L97812 Non-pressure chronic ulcer of other part of right lower leg with fat layer exposed: Secondary | ICD-10-CM | POA: Diagnosis not present

## 2019-08-18 NOTE — Progress Notes (Addendum)
Mary Chaney, Mary Chaney ([id number]) Visit Report for 08/18/2019 Chief Complaint Document Details Patient Name: Date of Service: Mary Chaney, Mary Chaney 08/18/2019 2:00 PM Medical Record Number: [id number] Patient Account Number: [id number] Date of Birth/Sex: Treating RN: [birth year] (86 y.o. [name] Primary Care Provider: PA Haig Prophet, [place] Other Clinician: Referring Provider: Treating Provider/Extender: Sharalyn Ink in Treatment: 7 Information Obtained from: Patient Chief Complaint Right LE Ulcer Electronic Signature(s) Signed: 08/18/2019 2:22:37 PM By: Worthy Keeler PA-C Entered By: Worthy Keeler on 08/18/2019 Chaney:22:37 -------------------------------------------------------------------------------- HPI Details Patient Name: Date of Service: Mary Alm E. 08/18/2019 2:00 PM Medical Record Number: [id number] Patient Account Number: [id number] Date of Birth/Sex: Treating RN: [birth year] Mary Chaney (86 y.o. [name] Primary Care Provider: PA Haig Prophet, [place] Other Clinician: Referring Provider: Treating Provider/Extender: Sharalyn Ink in Treatment: 7 History of Present Illness HPI Description: 06/30/2019 upon evaluation today patient presents for initial inspection here in our clinic concerning issues that she has been having with a ulcer on her right lateral lower extremity. She tells me that this has been present for about a month. She notes it came up initially as a blister that just showed up out of nowhere and then subsequently has worsened from that point. She has a lot of swelling and history of venous insufficiency. She also has what appears to be mild lymphedema and hemosiderin staining. The patient also has atrial fibrillation, is on long-term anticoagulant therapy, has hypertension, and congestive heart failure. Currently she tells me that the wound is giving her some trouble as far as pain is concerned but fortunately does not appear to be extremely painful which is good  news. No fevers, chills, nausea, vomiting, or diarrhea. She has in the past worn compression socks but tells me its been about a year since she did. They are very difficult to get on she tells me. 5/15; patient admitted to the clinic 10 days ago. She has a wound on her right lateral lower leg in the setting of severe chronic venous insufficiency with tightly adherent fibrous skin in her lower extremities. We have her with Hydrofera Blue and 3 layer compression 07/21/2019 upon evaluation today patient actually appears to be doing quite well with regard to her leg ulcer. She has been tolerating the dressing changes without complication. Fortunately there is no signs of active infection at this time. 07/28/2019 upon evaluation today patient actually appears to be doing quite well with regard to her leg ulcer. This in fact appears to be showing a lot of signs of new healing which is great news. Fortunately there is no signs of active infection which is also excellent. The Hydrofera Blue seems to be doing very well for her. She is having a lot of itching we may want to use a little bit of a steroid cream to try to help this out. 08/04/2019 on evaluation today patient appears to be doing better with regard to her wound. She has been tolerating the Hydrofera Blue and the wound does appear to be significantly improved which is great news. There is no signs of active infection at this time. 08/11/2019 on evaluation today patient actually appears to be doing fairly well with regard to her wound. Fortunately there is no signs of active infection the Hydrofera Blue is done best up to this point but I really think this may be healed today. 08/18/2019 upon evaluation today patient appears to be doing excellent in regard to her leg ulcer. She has been tolerating the  dressing changes without complication and fortunately there is no evidence of active infection at this time. In fact she is completely healed based on what I am  seeing currently. Electronic Signature(s) Signed: 08/18/2019 3:00:34 PM By: Lenda Kelp PA-C Entered By: Lenda Kelp on 08/18/2019 15:00:33 -------------------------------------------------------------------------------- Physical Exam Details Patient Name: Date of Service: Mary Chaney, Mary Chaney 08/18/2019 2:00 PM Medical Record Number: [id number] Patient Account Number: [id number] Date of Birth/Sex: Treating RN: [birth year] Mary Chaney (86 y.o. [name] Primary Care Provider: PA Zenovia Jordan, [place] Other Clinician: Referring Provider: Treating Provider/Extender: Skeet Simmer in Treatment: 7 Constitutional Well-nourished and well-hydrated in no acute distress. Respiratory normal breathing without difficulty. Psychiatric this patient is able to make decisions and demonstrates good insight into disease process. Alert and Oriented x 3. pleasant and cooperative. Notes Patient's wound bed showed signs of complete epithelization at this point and overall I think she is doing excellent currently. There is no evidence of active infection at this time which is great news. Electronic Signature(s) Signed: 08/18/2019 3:00:46 PM By: Lenda Kelp PA-C Entered By: Lenda Kelp on 08/18/2019 15:00:45 -------------------------------------------------------------------------------- Physician Orders Details Patient Name: Date of Service: Mary Seat E. 08/18/2019 2:00 PM Medical Record Number: [id number] Patient Account Number: [id number] Date of Birth/Sex: Treating RN: [birth year] Chaney (86 y.o. [name] Primary Care Provider: PA Zenovia Jordan, [place] Other Clinician: Referring Provider: Treating Provider/Extender: Skeet Simmer in Treatment: 7 Verbal / Phone Orders: No Diagnosis Coding ICD-10 Coding Code Description I87.2 Venous insufficiency (chronic) (peripheral) I89.0 Lymphedema, not elsewhere classified L97.812 Non-pressure chronic ulcer of other part of right lower leg with fat layer  exposed I48.0 Paroxysmal atrial fibrillation I50.42 Chronic combined systolic (congestive) and diastolic (congestive) heart failure I10 Essential (primary) hypertension Z79.01 Long term (current) use of anticoagulants Discharge From [facility] Services Discharge from Wound Care Center Skin Barriers/Peri-Wound Care Moisturizing lotion - to legs Mary Chaney Edema Control Avoid standing for long periods of time Elevate legs to the level of the heart or above for 30 minutes Mary Chaney and/or when sitting, a frequency of: - throughout the day Exercise regularly Support Garment 20-30 mm/Hg pressure to: - compression stocking to both legs Mary Chaney Electronic Signature(s) Signed: 08/18/2019 4:25:30 PM By: Zenaida Deed RN, BSN Signed: 08/20/2019 3:47:20 PM By: Lenda Kelp PA-C Entered By: Zenaida Deed on 08/18/2019 Chaney:58:13 -------------------------------------------------------------------------------- Problem List Details Patient Name: Date of Service: Mary Seat E. 08/18/2019 2:00 PM Medical Record Number: [id number] Patient Account Number: [id number] Date of Birth/Sex: Treating RN: Mary Ch[birth year] (86 y.o. Mary Chaney, Mary Chaney Primary Care Provider: PA Zenovia Jordan, [place] Other Clinician: Referring Provider: Treating Provider/Extender: Skeet Simmer in Treatment: 7 Active Problems ICD-10 Encounter Code Description Active Date MDM Diagnosis I87.2 Venous insufficiency (chronic) (peripheral) 06/30/2019 No Yes I89.0 Lymphedema, not elsewhere classified 06/30/2019 No Yes L97.812 Non-pressure chronic ulcer of other part of right lower leg with fat layer 06/30/2019 No Yes exposed I48.0 Paroxysmal atrial fibrillation 06/30/2019 No Yes I50.42 Chronic combined systolic (congestive) and diastolic (congestive) heart failure 06/30/2019 No Yes I10 Essential (primary) hypertension 06/30/2019 No Yes Z79.01 Long term (current) use of anticoagulants 06/30/2019 No Yes Inactive Problems Resolved Problems Electronic  Signature(s) Signed: 08/18/2019 2:22:32 PM By: Lenda Kelp PA-C Entered By: Lenda Kelp on 08/18/2019 Chaney:22:31 -------------------------------------------------------------------------------- Progress Note Details Patient Name: Date of Service: Mary Seat E. 08/18/2019 2:00 PM Medical Record Number: [id number] Patient Account Number: [id number] Date of Birth/Sex: Treating RN: Mary Chaney, Mary Chaney (86 y.o. [name] Primary Care Provider: PA  Zenovia Jordan, [place] Other Clinician: Referring Provider: Treating Provider/Extender: Skeet Simmer in Treatment: 7 Subjective Chief Complaint Information obtained from Patient Right LE Ulcer History of Present Illness (HPI) 06/30/2019 upon evaluation today patient presents for initial inspection here in our clinic concerning issues that she has been having with a ulcer on her right lateral lower extremity. She tells me that this has been present for about a month. She notes it came up initially as a blister that just showed up out of nowhere and then subsequently has worsened from that point. She has a lot of swelling and history of venous insufficiency. She also has what appears to be mild lymphedema and hemosiderin staining. The patient also has atrial fibrillation, is on long-term anticoagulant therapy, has hypertension, and congestive heart failure. Currently she tells me that the wound is giving her some trouble as far as pain is concerned but fortunately does not appear to be extremely painful which is good news. No fevers, chills, nausea, vomiting, or diarrhea. She has in the past worn compression socks but tells me its been about a year since she did. They are very difficult to get on she tells me. 5/15; patient admitted to the clinic 10 days ago. She has a wound on her right lateral lower leg in the setting of severe chronic venous insufficiency with tightly adherent fibrous skin in her lower extremities. We have her with Hydrofera Blue and  3 layer compression 07/21/2019 upon evaluation today patient actually appears to be doing quite well with regard to her leg ulcer. She has been tolerating the dressing changes without complication. Fortunately there is no signs of active infection at this time. 07/28/2019 upon evaluation today patient actually appears to be doing quite well with regard to her leg ulcer. This in fact appears to be showing a lot of signs of new healing which is great news. Fortunately there is no signs of active infection which is also excellent. The Hydrofera Blue seems to be doing very well for her. She is having a lot of itching we may want to use a little bit of a steroid cream to try to help this out. 08/04/2019 on evaluation today patient appears to be doing better with regard to her wound. She has been tolerating the Hydrofera Blue and the wound does appear to be significantly improved which is great news. There is no signs of active infection at this time. 08/11/2019 on evaluation today patient actually appears to be doing fairly well with regard to her wound. Fortunately there is no signs of active infection the Hydrofera Blue is done best up to this point but I really think this may be healed today. 08/18/2019 upon evaluation today patient appears to be doing excellent in regard to her leg ulcer. She has been tolerating the dressing changes without complication and fortunately there is no evidence of active infection at this time. In fact she is completely healed based on what I am seeing currently. Objective Constitutional Well-nourished and well-hydrated in no acute distress. Vitals Time Taken: 2:23 PM, Height: 60 in, Weight: 125 lbs, BMI: 24.4, Temperature: 98.3 F, Pulse: 95 bpm, Respiratory Rate: 16 breaths/min, Blood Pressure: 133/81 mmHg. Respiratory normal breathing without difficulty. Psychiatric this patient is able to make decisions and demonstrates good insight into disease process. Alert and  Oriented x 3. pleasant and cooperative. General Notes: Patient's wound bed showed signs of complete epithelization at this point and overall I think she is doing excellent currently. There is no  evidence of active infection at this time which is great news. Integumentary (Hair, Skin) Wound #1 status is Healed - Epithelialized. Original cause of wound was Gradually Appeared. The wound is located on the Right,Lateral Lower Leg. The wound measures 0cm length x 0cm width x 0cm depth; 0cm^2 area and 0cm^3 volume. There is no tunneling or undermining noted. There is a none present amount of drainage noted. The wound margin is well defined and not attached to the wound base. There is no granulation within the wound bed. There is no necrotic tissue within the wound bed. Assessment Active Problems ICD-10 Venous insufficiency (chronic) (peripheral) Lymphedema, not elsewhere classified Non-pressure chronic ulcer of other part of right lower leg with fat layer exposed Paroxysmal atrial fibrillation Chronic combined systolic (congestive) and diastolic (congestive) heart failure Essential (primary) hypertension Long term (current) use of anticoagulants Plan Discharge From [facility] Services: Discharge from Wound Care Center Skin Barriers/Peri-Wound Care: Moisturizing lotion - to legs Mary Chaney Edema Control: Avoid standing for long periods of time Elevate legs to the level of the heart or above for 30 minutes Mary Chaney and/or when sitting, a frequency of: - throughout the day Exercise regularly Support Garment 20-30 mm/Hg pressure to: - compression stocking to both legs Mary Chaney 1. I would recommend that we discontinue wound care services as the patient is completely healed. 2. I would recommend that she use her own compression stocking at home in order to keep her edema under good control. 3. She is allowed at this point to take a shower as long she does not scrub the area too hard I think she will be just fine. We  will see patient back for reevaluation in 1 week here in the clinic. If anything worsens or changes patient will contact our office for additional recommendations. Electronic Signature(s) Signed: 08/18/2019 3:01:10 PM By: Lenda Kelp PA-C Entered By: Lenda Kelp on 08/18/2019 15:01:10 -------------------------------------------------------------------------------- SuperBill Details Patient Name: Date of Service: Mary Chaney 08/18/2019 Medical Record Number: [id number] Patient Account Number: [id number] Date of Birth/Sex: Treating RN: Mary Chaney[birth year] (86 y.o. [name] Primary Care Provider: PA Zenovia Jordan, [place] Other Clinician: Referring Provider: Treating Provider/Extender: Skeet Simmer in Treatment: 7 Diagnosis Coding ICD-10 Codes Code Description I87.2 Venous insufficiency (chronic) (peripheral) I89.0 Lymphedema, not elsewhere classified L97.812 Non-pressure chronic ulcer of other part of right lower leg with fat layer exposed I48.0 Paroxysmal atrial fibrillation I50.42 Chronic combined systolic (congestive) and diastolic (congestive) heart failure I10 Essential (primary) hypertension Z79.01 Long term (current) use of anticoagulants Facility Procedures Physician Procedures : CPT4 Code Description Modifier [id number] - WC PHYS LEVEL 3 - EST PT ICD-10 Diagnosis Description I87.2 Venous insufficiency (chronic) (peripheral) I89.0 Lymphedema, not elsewhere classified I48.0 Paroxysmal atrial fibrillation L97.812 Non-pressure  chronic ulcer of other part of right lower leg with fat layer exposed Quantity: 1 Electronic Signature(s) Signed: 08/18/2019 4:25:30 PM By: Zenaida Deed RN, BSN Signed: 08/20/2019 3:47:20 PM By: Lenda Kelp PA-C Previous Signature: 08/18/2019 3:01:25 PM Version By: Lenda Kelp PA-C Entered By: Zenaida Deed on 08/18/2019 15:03:58

## 2019-08-20 NOTE — Progress Notes (Signed)
MYLIN, GIGNAC ([id number]) Visit Report for 08/18/2019 Arrival Information Details Patient Name: Date of Service: Mary Chaney, Mary Chaney 08/18/2019 2:00 PM Medical Record Number: [id number] Patient Account Number: [id number] Date of Birth/Sex: Treating RN: Dec 06, 1927 (86 y.o. Mary Chaney Primary Care Mary Chaney: PA TIENT, NO Other Clinician: Referring Mary Chaney: 7 Visit Information History Since Last Visit Added or deleted any medications: No Patient Arrived: Mary Chaney Any new allergies or adverse reactions: No Arrival Time: 14:22 Had a fall or experienced change in No Accompanied By: alone activities of daily living that may affect Transfer Assistance: None risk of falls: Patient Identification Verified: Yes Signs or symptoms of abuse/neglect since last visito No Secondary Verification Process Completed: Yes Hospitalized since last visit: No Patient Requires Transmission-Based Precautions: No Implantable device outside of the clinic excluding No Patient Has Alerts: Yes cellular tissue based products placed in the center Patient Alerts: Patient on Blood Thinner since last visit: Has Dressing in Place as Prescribed: Yes Has Compression in Place as Prescribed: Yes Pain Present Now: No Electronic Signature(s) Signed: 08/20/2019 5:45:26 PM By: Mary Abts RN, BSN Entered By: Mary Chaney on 08/18/2019 14:22:52 -------------------------------------------------------------------------------- Clinic Level of Care Assessment Details Patient Name: Date of Service: Mary Chaney, Mary Mary E. 08/18/2019 2:00 PM Medical Record Number: [id number] Patient Account Number: [id number] Date of Birth/Sex: Treating RN: 14-Nov-1927 (86 y.o. Mary Chaney Primary Care Klani Caridi: PA Zenovia Jordan, NO Other Clinician: Referring Edrees Valent: Treating Annamarie Yamaguchi/Extender: Mary Chaney in Chaney: 7 Clinic Level of Care Assessment Items TOOL 4  Quantity Score []  - 0 Use when only an EandM is performed on FOLLOW-UP visit ASSESSMENTS - Nursing Assessment / Reassessment X- 1 10 Reassessment of Co-morbidities (includes updates in patient status) X- 1 5 Reassessment of Adherence to Chaney Plan ASSESSMENTS - Wound and Skin A ssessment / Reassessment X - Simple Wound Assessment / Reassessment - one wound 1 5 []  - 0 Complex Wound Assessment / Reassessment - multiple wounds []  - 0 Dermatologic / Skin Assessment (not related to wound area) ASSESSMENTS - Focused Assessment X- 1 5 Circumferential Edema Measurements - multi extremities []  - 0 Nutritional Assessment / Counseling / Intervention X- 1 5 Lower Extremity Assessment (monofilament, tuning fork, pulses) []  - 0 Peripheral Arterial Disease Assessment (using hand held doppler) ASSESSMENTS - Ostomy and/or Continence Assessment and Care []  - 0 Incontinence Assessment and Management []  - 0 Ostomy Care Assessment and Management (repouching, etc.) PROCESS - Coordination of Care X - Simple Patient / Family Education for ongoing care 1 15 []  - 0 Complex (extensive) Patient / Family Education for ongoing care X- 1 10 Staff obtains , Records, T Results / Process Orders est []  - 0 Staff telephones HHA, Nursing Homes / Clarify orders / etc []  - 0 Routine Transfer to another Facility (non-emergent condition) []  - 0 Routine Hospital Admission (non-emergent condition) []  - 0 New Admissions / / Ordering NPWT Apligraf, etc. , []  - 0 Emergency Hospital Admission (emergent condition) X- 1 10 Simple Discharge Coordination []  - 0 Complex (extensive) Discharge Coordination PROCESS - Special Needs []  - 0 Pediatric / Minor Patient Management []  - 0 Isolation Patient Management []  - 0 Hearing / Language / Visual special needs []  - 0 Assessment of Community assistance (transportation, D/C planning, etc.) []  - 0 Additional assistance / Altered  mentation []  - 0 Support Surface(s) Assessment (bed, cushion, seat, etc.) INTERVENTIONS - Wound Cleansing / Measurement X - Simple Wound Cleansing -  one wound 1 5 []  - 0 Complex Wound Cleansing - multiple wounds X- 1 5 Wound Imaging (photographs - any number of wounds) []  - 0 Wound Tracing (instead of photographs) []  - 0 Simple Wound Measurement - one wound []  - 0 Complex Wound Measurement - multiple wounds INTERVENTIONS - Wound Dressings []  - 0 Small Wound Dressing one or multiple wounds []  - 0 Medium Wound Dressing one or multiple wounds []  - 0 Large Wound Dressing one or multiple wounds []  - 0 Application of Medications - topical []  - 0 Application of Medications - injection INTERVENTIONS - Miscellaneous []  - 0 External ear exam []  - 0 Specimen Collection (cultures, biopsies, blood, body fluids, etc.) []  - 0 Specimen(s) / Culture(s) sent or taken to Lab for analysis []  - 0 Patient Transfer (multiple staff / / Similar devices) []  - 0 Simple Staple / Suture removal (25 or less) []  - 0 Complex Staple / Suture removal (26 or more) []  - 0 Hypo / Hyperglycemic Management (close monitor of Blood Glucose) []  - 0 Ankle / Brachial Index (ABI) - do not check if billed separately X- 1 5 Vital Signs Has the patient been seen at the hospital within the last three years: Yes Total Score: 80 Level Of Care: New/Established - Level 3 Electronic Signature(s) Signed: 08/18/2019 4:25:30 PM By: RN, BSN Entered By: on 08/18/2019 14:57:00 -------------------------------------------------------------------------------- Encounter Discharge Information Details Patient Name: Date of Service: E. 08/18/2019 2:00 PM Medical Record Number: Patient Account Number: Date of Birth/Sex: Treating RN: 1927-03-10 (86 y.o. Primary Care Eddis Pingleton: PA , Other Clinician: Referring  Cari Vandeberg: Treating Cherylee Rawlinson/Extender: Nurse, adult in Chaney: 7 Encounter Discharge Information Items Discharge Condition: Stable Ambulatory Status: Cane Discharge Destination: Home Transportation: Private Auto Accompanied By: self Schedule Follow-up Appointment: Yes Clinical Summary of Care: Patient Declined Electronic Signature(s) Signed: 08/18/2019 4:25:30 PM By: RN, BSN Entered By: on 08/18/2019 15:03:13 -------------------------------------------------------------------------------- Lower Extremity Assessment Details Patient Name: Date of Service: Mary Chaney, Mary Mary E. 08/18/2019 2:00 PM Medical Record Number: Zenaida Deed Patient Account Number: 08/20/2019 Date of Birth/Sex: Treating RN: September 04, 1927 (86 y.o. 08/20/2019 Primary Care Nyron Mozer: PA TIENT, NO Other Clinician: Referring Onofrio Klemp: Treating Kailynn Satterly/Extender: [id number] in Chaney: 7 Edema Assessment Assessed: [Left: No] [Right: No] Edema: [Left: No] [Right: No] Calf Left: Right: Point of Measurement: 28 cm From Medial Instep 34 cm 30 cm Ankle Left: Right: Point of Measurement: 11 cm From Medial Instep 19.5 cm 19 cm Vascular Assessment Pulses: Dorsalis Pedis Palpable: [Right:Yes] Electronic Signature(s) Signed: 08/20/2019 5:45:26 PM By: 03/18/1927 RN, BSN Entered By: Mary Chaney on 08/18/2019 14:28:46 -------------------------------------------------------------------------------- Multi-Disciplinary Care Plan Details Patient Name: Date of Service: Mary Chaney Virginia E. 08/18/2019 2:00 PM Medical Record Number: 08/20/2019 Patient Account Number: Zenaida Deed Date of Birth/Sex: Treating RN: 06/20/1927 (86 y.o. 08/20/2019 Primary Care Charina Fons: PA Emilie Rutter, 08/20/2019 Other Clinician: Referring Sibley Rolison: Treating Sindia Kowalczyk/Extender: [phone] in Chaney: 7 Active Inactive Electronic Signature(s) Signed: 08/18/2019 4:25:30 PM By: 03/18/1927 RN, BSN Entered By: Mary Chaney on 08/18/2019 14:56:12 -------------------------------------------------------------------------------- Pain Assessment Details Patient Name: Date of Service: 08/22/2019 E. 08/18/2019 2:00 PM Medical Record Number: Mary Chaney Patient Account Number: 08/20/2019 Date of Birth/Sex: Treating RN: September 05, 1927 (86 y.o. 08/20/2019 Primary Care Dejanee Thibeaux: PA [phone], NO Other Clinician: Referring Kyrian Stage: Treating Marry Kusch/Extender: [phone] in Chaney: 7 Active Problems Location  of Pain Severity and Description of Pain Patient Has Paino No Site Locations Pain Management and Medication Current Pain Management: Electronic Signature(s) Signed: 08/20/2019 5:45:26 PM By: Levan Hurst RN, BSN Entered By: Levan Hurst on 08/18/2019 14:23:52 -------------------------------------------------------------------------------- Patient/Caregiver Education Details Patient Name: Date of Service: Hale Drone 6/23/2021andnbsp2:00 PM Medical Record Number: [id number] Patient Account Number: [id number] Date of Birth/Gender: Treating RN: 29-Dec-1927 (86 y.o. [name] Primary Care Physician: PA Haig Prophet, [place] Other Clinician: Referring Physician: Treating Physician/Extender: Sharalyn Ink in Chaney: 7 Education Assessment Education Provided To: Patient Education Topics Provided Venous: Methods: Explain/Verbal Responses: Reinforcements needed, State content correctly Wound/Skin Impairment: Methods: Explain/Verbal Responses: Reinforcements needed, State content correctly Electronic Signature(s) Signed: 08/18/2019 4:25:30 PM By: Baruch Gouty RN, BSN Entered By: Baruch Gouty on 08/18/2019 14:56:32 -------------------------------------------------------------------------------- Wound Assessment Details Patient Name: Date of Service: [name] 08/18/2019 2:00 PM Medical Record Number:  [id number] Patient Account Number: [id number] Date of Birth/Sex: Treating RN: Jan 26, 1928 (86 y.o. [name] Primary Care Ronetta Molla: PA TIENT, NO Other Clinician: Referring Lowen Barringer: Treating Josefa Syracuse/Extender: Sharalyn Ink in Chaney: 7 Wound Status Wound Number: 1 Primary Etiology: Venous Leg Ulcer Wound Location: Right, Lateral Lower Leg Wound Status: Healed - Epithelialized Wounding Event: Gradually Appeared Comorbid History: Arrhythmia, Congestive Heart Failure, Hypertension Date Acquired: 05/27/2019 Weeks Of Chaney: 7 Clustered Wound: No Photos Wound Measurements Length: (cm) Width: (cm) Depth: (cm) Area: (cm) Volume: (cm) 0 % Reduction in Area: 100% 0 % Reduction in Volume: 100% 0 Epithelialization: Large (67-100%) 0 Tunneling: No 0 Undermining: No Wound Description Classification: Full Thickness Without Exposed Support Structures Wound Margin: Well defined, not attached Exudate Amount: None Present Foul Odor After Cleansing: No Slough/Fibrino No Wound Bed Granulation Amount: None Present (0%) Exposed Structure Necrotic Amount: None Present (0%) Fascia Exposed: No Fat Layer (Subcutaneous Tissue) Exposed: No Tendon Exposed: No Muscle Exposed: No Joint Exposed: No Bone Exposed: No Electronic Signature(s) Signed: 08/18/2019 5:20:20 PM By: Minerva Fester Signed: 08/20/2019 5:45:26 PM By: Levan Hurst RN, BSN Entered By: Minerva Fester on 08/18/2019 16:21:29 -------------------------------------------------------------------------------- Vitals Details Patient Name: Date of Service: [name] 08/18/2019 2:00 PM Medical Record Number: [id number] Patient Account Number: [id number] Date of Birth/Sex: Treating RN: 13-Sep-1927 (86 y.o. [name] Primary Care Zackrey Dyar: PA TIENT, NO Other Clinician: Referring Gricel Copen: Treating Arihaan Bellucci/Extender: Sharalyn Ink in Chaney: 7 Vital Signs Time Taken: 14:23 Temperature  (F): 98.3 Height (in): 60 Pulse (bpm): 95 Weight (lbs): 125 Respiratory Rate (breaths/min): 16 Body Mass Index (BMI): 24.4 Blood Pressure (mmHg): 133/81 Reference Range: 80 - 120 mg / dl Electronic Signature(s) Signed: 08/20/2019 5:45:26 PM By: Levan Hurst RN, BSN Entered By: Levan Hurst on 08/18/2019 14:23:48

## 2019-09-15 ENCOUNTER — Other Ambulatory Visit: Payer: Self-pay | Admitting: Interventional Cardiology

## 2019-10-28 DIAGNOSIS — E0580 Other thyrotoxicosis without thyrotoxic crisis or storm: Secondary | ICD-10-CM | POA: Diagnosis not present

## 2019-10-28 DIAGNOSIS — E0520 Thyrotoxicosis with toxic multinodular goiter without thyrotoxic crisis or storm: Secondary | ICD-10-CM | POA: Diagnosis not present

## 2019-10-31 ENCOUNTER — Other Ambulatory Visit: Payer: Self-pay | Admitting: Interventional Cardiology

## 2019-11-02 NOTE — Telephone Encounter (Signed)
Prescription refill request for Xarelto received.   Last office visit: Katrinka Blazing 12/17/2018 Weight: 56.1 kg  Age: 86 y.o. Scr: 0.69, 12/17/2018 CrCl: 47 ml/min   Prescription refill sent.

## 2019-11-06 ENCOUNTER — Other Ambulatory Visit: Payer: Self-pay | Admitting: Interventional Cardiology

## 2019-12-04 ENCOUNTER — Other Ambulatory Visit: Payer: Self-pay | Admitting: Interventional Cardiology

## 2019-12-10 DIAGNOSIS — E0520 Thyrotoxicosis with toxic multinodular goiter without thyrotoxic crisis or storm: Secondary | ICD-10-CM | POA: Diagnosis not present

## 2019-12-10 DIAGNOSIS — D709 Neutropenia, unspecified: Secondary | ICD-10-CM | POA: Diagnosis not present

## 2019-12-10 DIAGNOSIS — E0580 Other thyrotoxicosis without thyrotoxic crisis or storm: Secondary | ICD-10-CM | POA: Diagnosis not present

## 2019-12-11 ENCOUNTER — Other Ambulatory Visit: Payer: Self-pay

## 2019-12-11 ENCOUNTER — Ambulatory Visit: Payer: Medicare Other | Attending: Internal Medicine

## 2019-12-11 DIAGNOSIS — Z23 Encounter for immunization: Secondary | ICD-10-CM

## 2019-12-11 NOTE — Progress Notes (Signed)
   Covid-19 Vaccination Clinic  Name:  Mary Chaney    MRN: [id number] DOB: Nov 21, 1927  12/11/2019  Mary Chaney was observed post Covid-19 immunization for 15 minutes without incident. She was provided with Vaccine Information Sheet and instruction to access the V-Safe system.   Mary Chaney was instructed to call 911 with any severe reactions post vaccine: Marland Kitchen Difficulty breathing  . Swelling of face and throat  . A fast heartbeat  . A bad rash all over body  . Dizziness and weakness

## 2019-12-22 NOTE — Progress Notes (Signed)
Cardiology Office Note:    Date:  12/24/2019   ID:  Mary Chaney, DOB 28-May-1927, MRN [id number]  PCP:  Patient, No Pcp Per  Cardiologist:  Lesleigh Noe, MD   Referring MD: No ref. provider found Chief Complaint  Patient presents with  . Atrial Fibrillation  . Congestive Heart Failure    History of Present Illness:    Mary Chaney is a 86 y.o. female with a hx of  chronic diastolic heart failure,persistent atrial fibrillation on Xarelto, hypertension and toxic multinodular goiter.  She is doing well with her only complaint being lower extremity swelling, which is now improved.  She did develop lower extremity wound on the right.  She had wrapping.  She now has a follow-up compression stockings.  She is not having chest pain.  She does have some dyspnea on exertion but this is not changing her quality of life.  She has not had syncope.  She denies orthopnea, PND, transient neurological complaints, and has not noticed blood in her urine or stool.  Did we do blood work on Mary Chaney  Past Medical History:  Diagnosis Date  . Atrial fibrillation (HCC)   . Hemorrhoids   . Hypertension   . Toxic multinodular goiter     History reviewed. No pertinent surgical history.  Current Medications: Current Meds  Medication Sig  . methimazole (TAPAZOLE) 10 MG tablet Take 5 mg by mouth daily.   . Rivaroxaban (XARELTO) 15 MG TABS tablet TAKE 1 TABLET (15 MG TOTAL) BY MOUTH DAILY WITH SUPPER.  . valsartan-hydrochlorothiazide (DIOVAN-HCT) 160-25 MG tablet Take 1 tablet by mouth daily.  . [DISCONTINUED] metoprolol succinate (TOPROL-XL) 25 MG 24 hr tablet TAKE 1.5 TABLETS (37.5 MG TOTAL) BY MOUTH DAILY.  . [DISCONTINUED] valsartan-hydrochlorothiazide (DIOVAN-HCT) 160-25 MG tablet Take 1 tablet by mouth daily. Please keep upcoming appt in October with Dr. Katrinka Blazing before anymore refills. Thank you  . [DISCONTINUED] XARELTO 15 MG TABS tablet TAKE 1 TABLET (15 MG TOTAL) BY MOUTH  DAILY WITH SUPPER.     Allergies:   Bee venom   Social History   Socioeconomic History  . Marital status: Widowed    Spouse name: Not on file  . Number of children: Not on file  . Years of education: Not on file  . Highest education level: Not on file  Occupational History  . Not on file  Tobacco Use  . Smoking status: Former Smoker    Packs/day: 1.00    Quit date: 02/25/1983    Years since quitting: 36.8  . Smokeless tobacco: Never Used  Substance and Sexual Activity  . Alcohol use: No    Alcohol/week: 0.0 standard drinks  . Drug use: No  . Sexual activity: Not on file  Other Topics Concern  . Not on file  Social History Narrative  . Not on file   Social Determinants of Health   Financial Resource Strain:   . Difficulty of Paying Living Expenses: Not on file  Food Insecurity:   . Worried About Programme researcher, broadcasting/film/video in the Last Year: Not on file  . Ran Out of Food in the Last Year: Not on file  Transportation Needs:   . Lack of Transportation (Medical): Not on file  . Lack of Transportation (Non-Medical): Not on file  Physical Activity:   . Days of Exercise per Week: Not on file  . Minutes of Exercise per Session: Not on file  Stress:   . Feeling of Stress :  Not on file  Social Connections:   . Frequency of Communication with Friends and Family: Not on file  . Frequency of Social Gatherings with Friends and Family: Not on file  . Attends Religious Services: Not on file  . Active Member of Clubs or Organizations: Not on file  . Attends Banker Meetings: Not on file  . Marital Status: Not on file     Family History: The patient's family history includes Other in her father and mother.  ROS:   Please see the history of present illness.    Healed wound on her right lower extremity.  She is limiting salt in her diet.  She is on Tapazole for thyroid.  Recent TSH was 3.97 by Dr. Sharl Ma.  All other systems reviewed and are negative.  EKGs/Labs/Other  Studies Reviewed:    The following studies were reviewed today: No new imaging data  EKG:  EKG atrial fibrillation with poor rate control.  Ventricular rate 109.  Low voltage.  Compared to prior EKG from October 2020, the rate is faster.  Recent Labs: No results found for requested labs within last 8760 hours.  Recent Lipid Panel    Component Value Date/Time   CHOL  03/17/2008 0440    109        ATP III CLASSIFICATION:  <200     mg/dL   Desirable  433-295  mg/dL   Borderline High  >=188    mg/dL   High          TRIG 39 03/17/2008 0440   HDL 36 (L) 03/17/2008 0440   CHOLHDL 3.0 03/17/2008 0440   VLDL 8 03/17/2008 0440   LDLCALC  03/17/2008 0440    65        Total Cholesterol/HDL:CHD Risk Coronary Heart Disease Risk Table                     Men   Women  1/2 Average Risk   3.4   3.3  Average Risk       5.0   4.4  2 X Average Risk   9.6   7.1  3 X Average Risk  23.4   11.0        Use the calculated Patient Ratio above and the CHD Risk Table to determine the patient's CHD Risk.        ATP III CLASSIFICATION (LDL):  <100     mg/dL   Optimal  416-606  mg/dL   Near or Above                    Optimal  130-159  mg/dL   Borderline  301-601  mg/dL   High  >093     mg/dL   Very High    Physical Exam:    VS:  BP 132/74   Pulse (!) 111   Ht 4\' 11"  (1.499 m)   Wt 126 lb 12.8 oz (57.5 kg)   SpO2 98%   BMI 25.61 kg/m     Wt Readings from Last 3 Encounters:  12/24/19 126 lb 12.8 oz (57.5 kg)  12/17/18 123 lb 9.6 oz (56.1 kg)  12/01/18 124 lb 9.6 oz (56.5 kg)     GEN: Younger than stated age in appearance. No acute distress HEENT: Normal NECK: No JVD. LYMPHATICS: No lymphadenopathy CARDIAC: Rapid I I RR without murmur, gallop, or edema. VASCULAR:  Normal Pulses. No bruits. RESPIRATORY:  Clear to auscultation without rales, wheezing  or rhonchi  ABDOMEN: Soft, non-tender, non-distended, No pulsatile mass, MUSCULOSKELETAL: No deformity  SKIN: Warm and  dry NEUROLOGIC:  Alert and oriented x 3 PSYCHIATRIC:  Normal affect   ASSESSMENT:    1. Permanent atrial fibrillation (HCC)   2. Essential hypertension   3. Long term current use of anticoagulant therapy   4. Chronic diastolic HF (heart failure) (HCC)   5. Educated about COVID-19 virus infection    PLAN:    In order of problems listed above:  1. Poor rate control.  Increase metoprolol succinate to 50 mg/day.  Add digoxin 0.125 mg on Monday, Wednesday, and Friday.  1 month follow-up to check rate control with EKG.  Also screen for any evidence of medication side effects. 2. Continue Toprol-XL but at the slightly higher dose of 50 mg/day along with Diovan HCTZ. 3. Continue Xarelto at current dose.  Creatinine is being obtained today.  Hemoglobin done a month ago was normal. 4. Chronic but stable diastolic heart failure.  She has slight volume overload.  She is tolerating this well. 5. Clinical follow-up if develops upper respiratory disease.  Has been vaccinated.  Has not suffered COVID-19 disease.   Medication Adjustments/Labs and Tests Ordered: Current medicines are reviewed at length with the patient today.  Concerns regarding medicines are outlined above.  Orders Placed This Encounter  Procedures  . Basic metabolic panel  . EKG 12-Lead   Meds ordered this encounter  Medications  . digoxin (LANOXIN) 0.125 MG tablet    Sig: Take 1 tablet (0.125 mg total) by mouth every Monday, Wednesday, and Friday.    Dispense:  45 tablet    Refill:  3  . metoprolol succinate (TOPROL-XL) 50 MG 24 hr tablet    Sig: Take 1 tablet (50 mg total) by mouth daily. Take with or immediately following a meal.    Dispense:  90 tablet    Refill:  3    Dose change  . valsartan-hydrochlorothiazide (DIOVAN-HCT) 160-25 MG tablet    Sig: Take 1 tablet by mouth daily.    Dispense:  90 tablet    Refill:  3  . Rivaroxaban (XARELTO) 15 MG TABS tablet    Sig: TAKE 1 TABLET (15 MG TOTAL) BY MOUTH DAILY WITH  SUPPER.    Dispense:  90 tablet    Refill:  1    Patient Instructions  Medication Instructions:  1) INCREASE Metoprolol Succinate 50mg  once daily 2) START Digoxin 0.125mg  once daily on Monday, Wednesday and Friday  *If you need a refill on your cardiac medications before your next appointment, please call your pharmacy*   Lab Work: BMET today  If you have labs (blood work) drawn today and your tests are completely normal, you will receive your results only by: Sunday MyChart Message (if you have MyChart) OR . A paper copy in the mail If you have any lab test that is abnormal or we need to change your treatment, we will call you to review the results.   Testing/Procedures: None   Follow-Up: At [facility], you and your health needs are our priority.  As part of our continuing mission to provide you with exceptional heart care, we have created designated Provider Care Teams.  These Care Teams include your primary Cardiologist (physician) and Advanced Practice Providers (APPs -  Physician Assistants and Nurse Practitioners) who all work together to provide you with the care you need, when you need it.  We recommend signing up for the patient portal called "MyChart".  Sign up information is provided on this After Visit Summary.  MyChart is used to connect with patients for Virtual Visits (Telemedicine).  Patients are able to view lab/test results, encounter notes, upcoming appointments, etc.  Non-urgent messages can be sent to your provider as well.   To learn more about what you can do with MyChart, go to [email or web address].    Your next appointment:   1 month(s)  [email or web address] format for your next appointment:   In Person  Provider:   You may see Lesleigh NoeHenry W Jamian Andujo III, MD or one of the following Advanced Practice Providers on your designated Care Team:    Norma FredricksonLori Gerhardt, NP  Nada BoozerLaura Ingold, NP  Georgie ChardJill McDaniel, NP    Other Instructions      Signed, Lesleigh NoeHenry W Oluwadamilare Tobler III, MD    12/24/2019 9:50 AM    [facility]

## 2019-12-24 ENCOUNTER — Ambulatory Visit (INDEPENDENT_AMBULATORY_CARE_PROVIDER_SITE_OTHER): Payer: Medicare Other | Admitting: Interventional Cardiology

## 2019-12-24 ENCOUNTER — Encounter: Payer: Self-pay | Admitting: Interventional Cardiology

## 2019-12-24 ENCOUNTER — Other Ambulatory Visit: Payer: Self-pay

## 2019-12-24 VITALS — BP 132/74 | HR 111 | Ht 59.0 in | Wt 126.8 lb

## 2019-12-24 DIAGNOSIS — I5032 Chronic diastolic (congestive) heart failure: Secondary | ICD-10-CM | POA: Diagnosis not present

## 2019-12-24 DIAGNOSIS — I10 Essential (primary) hypertension: Secondary | ICD-10-CM

## 2019-12-24 DIAGNOSIS — Z7901 Long term (current) use of anticoagulants: Secondary | ICD-10-CM

## 2019-12-24 DIAGNOSIS — I4821 Permanent atrial fibrillation: Secondary | ICD-10-CM

## 2019-12-24 DIAGNOSIS — Z7189 Other specified counseling: Secondary | ICD-10-CM

## 2019-12-24 LAB — BASIC METABOLIC PANEL
BUN/Creatinine Ratio: 22 (ref 12–28)
BUN: 15 mg/dL (ref 10–36)
CO2: 27 mmol/L (ref 20–29)
Calcium: 9.8 mg/dL (ref 8.7–10.3)
Chloride: 98 mmol/L (ref 96–106)
Creatinine, Ser: 0.68 mg/dL (ref 0.57–1.00)
GFR calc Af Amer: 89 mL/min/{1.73_m2} (ref >59)
GFR calc non Af Amer: 77 mL/min/{1.73_m2} (ref >59)
Glucose: 89 mg/dL (ref 65–99)
Potassium: 3.6 mmol/L (ref 3.5–5.2)
Sodium: 138 mmol/L (ref 134–144)

## 2019-12-24 MED ORDER — METOPROLOL SUCCINATE ER 50 MG PO TB24: 50.0000 mg | ORAL_TABLET | Freq: Every day | ORAL | 3 refills | Status: AC

## 2019-12-24 MED ORDER — RIVAROXABAN 15 MG PO TABS: ORAL_TABLET | ORAL | 1 refills | Status: AC

## 2019-12-24 MED ORDER — DIGOXIN 125 MCG PO TABS: 0.1250 mg | ORAL_TABLET | ORAL | 3 refills | Status: AC

## 2019-12-24 MED ORDER — VALSARTAN-HYDROCHLOROTHIAZIDE 160-25 MG PO TABS: 1.0000 | ORAL_TABLET | Freq: Every day | ORAL | 3 refills | Status: AC

## 2019-12-24 NOTE — Patient Instructions (Signed)
Medication Instructions:  1) INCREASE Metoprolol Succinate 50mg  once daily 2) START Digoxin 0.125mg  once daily on Monday, Wednesday and Friday  *If you need a refill on your cardiac medications before your next appointment, please call your pharmacy*   Lab Work: BMET today  If you have labs (blood work) drawn today and your tests are completely normal, you will receive your results only by: Monday MyChart Message (if you have MyChart) OR . A paper copy in the mail If you have any lab test that is abnormal or we need to change your treatment, we will call you to review the results.   Testing/Procedures: None   Follow-Up: At [facility], you and your health needs are our priority.  As part of our continuing mission to provide you with exceptional heart care, we have created designated Provider Care Teams.  These Care Teams include your primary Cardiologist (physician) and Advanced Practice Providers (APPs -  Physician Assistants and Nurse Practitioners) who all work together to provide you with the care you need, when you need it.  We recommend signing up for the patient portal called "MyChart".  Sign up information is provided on this After Visit Summary.  MyChart is used to connect with patients for Virtual Visits (Telemedicine).  Patients are able to view lab/test results, encounter notes, upcoming appointments, etc.  Non-urgent messages can be sent to your provider as well.   To learn more about what you can do with MyChart, go to [facility].    Your next appointment:   1 month(s)  The format for your next appointment:   In Person  Provider:   You may see [email or web address], MD or one of the following Advanced Practice Providers on your designated Care Team:    Lesleigh Noe, NP  Norma Fredrickson, NP  Nada Boozer, NP    Other Instructions

## 2020-01-19 NOTE — Progress Notes (Signed)
CARDIOLOGY OFFICE NOTE  Date:  02/02/2020    Mary Chaney Date of Birth: 17-Apr-1927 Medical Record #[id number]  PCP:  Patient, No Pcp Per  Cardiologist:  Mary Chaney  Chief Complaint  Patient presents with  . Follow-up    Seen for Dr. Katrinka Chaney    History of Present Illness: Mary Chaney is a 86 y.o. female who presents today for a 5 week check. Seen for Dr. Katrinka Chaney.   She has a history of chronic diastolic heart failure,persistent atrial fibrillation on Xarelto, hypertension and toxic multinodular goiter.  She was seen towards the end of October by Dr. Katrinka Chaney-  Noting lower extremity edema - she had had wound on the right - some DOE which was felt to be at her baseline. Her rate was poorly controlled - beta blocker increased and digoxin three times a week was added.   Comes in today. Here with her daughter. Doing ok. She has just returned from a trip from [place] - just decorated for Christmas. Feels good. She notes if she rushes she can feel a little fast heart beating - but otherwise feels fine. BP looks good. No chest pain. Not short of breath.   Past Medical History:  Diagnosis Date  . Atrial fibrillation (HCC)   . Hemorrhoids   . Hypertension   . Toxic multinodular goiter     History reviewed. No pertinent surgical history.   Medications: Current Meds  Medication Sig  . digoxin (LANOXIN) 0.125 MG tablet Take 1 tablet (0.125 mg total) by mouth every Monday, Wednesday, and Friday.  . methimazole (TAPAZOLE) 10 MG tablet Take 5 mg by mouth daily.   . metoprolol succinate (TOPROL-XL) 50 MG 24 hr tablet Take 1 tablet (50 mg total) by mouth daily. Take with or immediately following a meal.  . Rivaroxaban (XARELTO) 15 MG TABS tablet TAKE 1 TABLET (15 MG TOTAL) BY MOUTH DAILY WITH SUPPER.  . valsartan-hydrochlorothiazide (DIOVAN-HCT) 160-25 MG tablet Take 1 tablet by mouth daily.     Allergies: Allergies  Allergen Reactions  . Bee Venom Swelling    Social  History: The patient  reports that she quit smoking about 36 years ago. She smoked 1.00 pack per day. She has never used smokeless tobacco. She reports that she does not drink alcohol and does not use drugs.   Family History: The patient's family history includes Other in her father and mother.   Review of Systems: Please see the history of present illness.   All other systems are reviewed and negative.   Physical Exam: VS:  BP 130/80   Pulse 89   Ht 4\' 11"  (1.499 m)   Wt 124 lb (56.2 kg)   BMI 25.04 kg/m  .  BMI Body mass index is 25.04 kg/m.  Wt Readings from Last 3 Encounters:  02/02/20 124 lb (56.2 kg)  12/24/19 126 lb 12.8 oz (57.5 kg)  12/17/18 123 lb 9.6 oz (56.1 kg)    General: Pleasant. Elderly. Alert and in no acute distress. She looks younger than her stated age.    Cardiac: Irregular irregular rhythm. Rate is ok. No murmurs, rubs, or gallops. No edema.  Respiratory:  Lungs are clear to auscultation bilaterally with normal work of breathing.  GI: Soft and nontender.  MS: No deformity or atrophy. Gait and ROM intact.  Skin: Warm and dry. Color is normal.  Neuro:  Strength and sensation are intact and no gross focal deficits noted.  Psych: Alert, appropriate and with  normal affect.   LABORATORY DATA:  EKG:  EKG is ordered today.  Personally reviewed by me. This demonstrates AF with controlled VR of 89 today.  Lab Results  Component Value Date   WBC 4.3 12/17/2018   HGB 12.5 12/17/2018   HCT 38.5 12/17/2018   PLT 189 12/17/2018   GLUCOSE 89 12/24/2019   CHOL  03/17/2008    109        ATP III CLASSIFICATION:  <200     mg/dL   Desirable  299-242  mg/dL   Borderline High  >=683    mg/dL   High          TRIG 39 03/17/2008   HDL 36 (L) 03/17/2008   LDLCALC  03/17/2008    65        Total Cholesterol/HDL:CHD Risk Coronary Heart Disease Risk Table                     Men   Women  1/2 Average Risk   3.4   3.3  Average Risk       5.0   4.4  2 X Average Risk    9.6   7.1  3 X Average Risk  23.4   11.0        Use the calculated Patient Ratio above and the CHD Risk Table to determine the patient's CHD Risk.        ATP III CLASSIFICATION (LDL):  <100     mg/dL   Optimal  419-622  mg/dL   Near or Above                    Optimal  130-159  mg/dL   Borderline  297-989  mg/dL   High  >211     mg/dL   Very High   ALT 19 94/17/4081   AST 21 03/06/2018   NA 138 12/24/2019   K 3.6 12/24/2019   CL 98 12/24/2019   CREATININE 0.68 12/24/2019   BUN 15 12/24/2019   CO2 27 12/24/2019   TSH 2.890 08/09/2016   HGBA1C  03/16/2008    6.0 (NOTE)   The ADA recommends the following therapeutic goal for glycemic   control related to Hgb A1C measurement:   Goal of Therapy:   < 7.0% Hgb A1C   Reference: American Diabetes Association: Clinical Practice   Recommendations 2008, Diabetes Care,  2008, 31:(Suppl 1).       BNP (last 3 results) No results for input(s): BNP in the last 8760 hours.  ProBNP (last 3 results) No results for input(s): PROBNP in the last 8760 hours.   Other Studies Reviewed Today:  Echo Study Conclusions 05/2017  - Left ventricle: The cavity size was normal. Systolic function was  normal. The estimated ejection fraction was in the range of 55%  to 60%. Wall motion was normal; there were no regional wall  motion abnormalities.  - Aortic valve: Valve mobility was restricted. Transvalvular  velocity was within the normal range. There was no stenosis.  There was no regurgitation.  - Mitral valve: Calcified annulus. Transvalvular velocity was  within the normal range. There was no evidence for stenosis.  There was mild regurgitation.  - Left atrium: The atrium was severely dilated.  - Right ventricle: The cavity size was normal. Wall thickness was  normal. Systolic function was normal.  - Right atrium: The atrium was severely dilated.  - Atrial septum: No defect or patent foramen ovale was  identified.  -  Tricuspid valve: There was moderate regurgitation.  - Pulmonary arteries: Systolic pressure was mildly increased. PA  peak pressure: 46 mm Hg (S).    ASSESSMENT & PLAN:    1. Persistent AF - rate is improved. Now on higher doses of Toprol and 3 times a week Digoxin - remains on anticoagulation as well.   2. Chronic anticoagulation - on Xarelto - no problems noted - had lab from October and last month noted.   3. HTN - BP is fine - no changes made.   4. Chronic diastolic HF - seems compensated. No changes made with current regimen.   5. Knee pain - she is going to try some topical agents - was previously turned down for TKR due to age. Would advise NOT using Tumeric.   6. Advanced age - does pretty well and continues to live independently.     Current medicines are reviewed with the patient today.  The patient does not have concerns regarding medicines other than what has been noted above.  The following changes have been made:  See above.  Labs/ tests ordered today include:    Orders Placed This Encounter  Procedures  . EKG 12-Lead     Disposition:   FU with Dr. Katrinka Chaney in 4 months. No change with current regimen.     Patient is agreeable to this plan and will call if any problems develop in the interim.   SignedNorma Fredrickson, NP  02/02/2020 9:19 AM  [facility] [address] [place], [place]  [address] Phone: [phone] Fax: [phone]

## 2020-02-02 ENCOUNTER — Ambulatory Visit (INDEPENDENT_AMBULATORY_CARE_PROVIDER_SITE_OTHER): Payer: Medicare Other | Admitting: Nurse Practitioner

## 2020-02-02 ENCOUNTER — Encounter: Payer: Self-pay | Admitting: Nurse Practitioner

## 2020-02-02 ENCOUNTER — Other Ambulatory Visit: Payer: Self-pay

## 2020-02-02 VITALS — BP 130/80 | HR 89 | Ht 59.0 in | Wt 124.0 lb

## 2020-02-02 DIAGNOSIS — I4821 Permanent atrial fibrillation: Secondary | ICD-10-CM

## 2020-02-02 DIAGNOSIS — Z7901 Long term (current) use of anticoagulants: Secondary | ICD-10-CM

## 2020-02-02 DIAGNOSIS — I5032 Chronic diastolic (congestive) heart failure: Secondary | ICD-10-CM

## 2020-02-02 DIAGNOSIS — I10 Essential (primary) hypertension: Secondary | ICD-10-CM

## 2020-02-02 NOTE — Patient Instructions (Addendum)
After Visit Summary:  We will be checking the following labs today - NONE   Medication Instructions:    Continue with your current medicines.    If you need a refill on your cardiac medications before your next appointment, please call your pharmacy.     Testing/Procedures To Be Arranged:  N/A  Follow-Up:   See Dr. Smith in about 4 months.    At [facility], you and your health needs are our priority.  As part of our continuing mission to provide you with exceptional heart care, we have created designated Provider Care Teams.  These Care Teams include your primary Cardiologist (physician) and Advanced Practice Providers (APPs -  Physician Assistants and Nurse Practitioners) who all work together to provide you with the care you need, when you need it.  Special Instructions:  . Stay safe, wash your hands for at least 20 seconds and wear a mask when needed.  . It was good to talk with you today.    Call the [facility] office at [phone] if you have any questions, problems or concerns.       

## 2020-04-17 ENCOUNTER — Inpatient Hospital Stay (HOSPITAL_COMMUNITY)
Admission: EM | Admit: 2020-04-17 | Discharge: 2020-04-25 | DRG: 064 | Disposition: E | Payer: Medicare Other | Attending: Pulmonary Disease | Admitting: Pulmonary Disease

## 2020-04-17 ENCOUNTER — Emergency Department (HOSPITAL_COMMUNITY): Payer: Medicare Other

## 2020-04-17 DIAGNOSIS — Z91030 Bee allergy status: Secondary | ICD-10-CM

## 2020-04-17 DIAGNOSIS — R918 Other nonspecific abnormal finding of lung field: Secondary | ICD-10-CM | POA: Diagnosis present

## 2020-04-17 DIAGNOSIS — R4189 Other symptoms and signs involving cognitive functions and awareness: Secondary | ICD-10-CM

## 2020-04-17 DIAGNOSIS — I629 Nontraumatic intracranial hemorrhage, unspecified: Secondary | ICD-10-CM

## 2020-04-17 DIAGNOSIS — I619 Nontraumatic intracerebral hemorrhage, unspecified: Secondary | ICD-10-CM | POA: Diagnosis present

## 2020-04-17 DIAGNOSIS — Z20822 Contact with and (suspected) exposure to covid-19: Secondary | ICD-10-CM | POA: Diagnosis present

## 2020-04-17 DIAGNOSIS — Z515 Encounter for palliative care: Secondary | ICD-10-CM

## 2020-04-17 DIAGNOSIS — J9601 Acute respiratory failure with hypoxia: Secondary | ICD-10-CM | POA: Diagnosis present

## 2020-04-17 DIAGNOSIS — Z79899 Other long term (current) drug therapy: Secondary | ICD-10-CM

## 2020-04-17 DIAGNOSIS — I5032 Chronic diastolic (congestive) heart failure: Secondary | ICD-10-CM | POA: Diagnosis present

## 2020-04-17 DIAGNOSIS — I110 Hypertensive heart disease with heart failure: Secondary | ICD-10-CM | POA: Diagnosis present

## 2020-04-17 DIAGNOSIS — G8191 Hemiplegia, unspecified affecting right dominant side: Secondary | ICD-10-CM | POA: Diagnosis present

## 2020-04-17 DIAGNOSIS — R4020 Unspecified coma: Secondary | ICD-10-CM | POA: Diagnosis present

## 2020-04-17 DIAGNOSIS — Z7189 Other specified counseling: Secondary | ICD-10-CM

## 2020-04-17 DIAGNOSIS — I4891 Unspecified atrial fibrillation: Secondary | ICD-10-CM | POA: Diagnosis present

## 2020-04-17 DIAGNOSIS — Z66 Do not resuscitate: Secondary | ICD-10-CM | POA: Diagnosis not present

## 2020-04-17 DIAGNOSIS — G935 Compression of brain: Secondary | ICD-10-CM | POA: Diagnosis present

## 2020-04-17 DIAGNOSIS — M2040 Other hammer toe(s) (acquired), unspecified foot: Secondary | ICD-10-CM | POA: Diagnosis present

## 2020-04-17 DIAGNOSIS — G936 Cerebral edema: Secondary | ICD-10-CM | POA: Diagnosis present

## 2020-04-17 DIAGNOSIS — Z888 Allergy status to other drugs, medicaments and biological substances status: Secondary | ICD-10-CM

## 2020-04-17 DIAGNOSIS — R471 Dysarthria and anarthria: Secondary | ICD-10-CM | POA: Diagnosis present

## 2020-04-17 DIAGNOSIS — Z7901 Long term (current) use of anticoagulants: Secondary | ICD-10-CM

## 2020-04-17 DIAGNOSIS — R29730 NIHSS score 30: Secondary | ICD-10-CM | POA: Diagnosis present

## 2020-04-17 DIAGNOSIS — I611 Nontraumatic intracerebral hemorrhage in hemisphere, cortical: Principal | ICD-10-CM | POA: Diagnosis present

## 2020-04-17 DIAGNOSIS — G9341 Metabolic encephalopathy: Secondary | ICD-10-CM | POA: Diagnosis present

## 2020-04-17 DIAGNOSIS — R29810 Facial weakness: Secondary | ICD-10-CM | POA: Diagnosis present

## 2020-04-17 DIAGNOSIS — Z87891 Personal history of nicotine dependence: Secondary | ICD-10-CM

## 2020-04-17 DIAGNOSIS — K4090 Unilateral inguinal hernia, without obstruction or gangrene, not specified as recurrent: Secondary | ICD-10-CM | POA: Diagnosis present

## 2020-04-17 DIAGNOSIS — E0520 Thyrotoxicosis with toxic multinodular goiter without thyrotoxic crisis or storm: Secondary | ICD-10-CM | POA: Diagnosis present

## 2020-04-17 LAB — CBC WITH DIFFERENTIAL/PLATELET
Abs Immature Granulocytes: 0.03 10*3/uL (ref 0.00–0.07)
Basophils Absolute: 0 10*3/uL (ref 0.0–0.1)
Basophils Relative: 0 %
Eosinophils Absolute: 0 10*3/uL (ref 0.0–0.5)
Eosinophils Relative: 0 %
HCT: 45.4 % (ref 36.0–46.0)
Hemoglobin: 14.1 g/dL (ref 12.0–15.0)
Immature Granulocytes: 0 %
Lymphocytes Relative: 7 %
Lymphs Abs: 0.6 10*3/uL — ABNORMAL LOW (ref 0.7–4.0)
MCH: 28.3 pg (ref 26.0–34.0)
MCHC: 31.1 g/dL (ref 30.0–36.0)
MCV: 91 fL (ref 80.0–100.0)
Monocytes Absolute: 0.4 10*3/uL (ref 0.1–1.0)
Monocytes Relative: 5 %
Neutro Abs: 7.5 10*3/uL (ref 1.7–7.7)
Neutrophils Relative %: 88 %
Platelets: 202 10*3/uL (ref 150–400)
RBC: 4.99 MIL/uL (ref 3.87–5.11)
RDW: 15.2 % (ref 11.5–15.5)
WBC: 8.5 10*3/uL (ref 4.0–10.5)
nRBC: 0 % (ref 0.0–0.2)

## 2020-04-17 LAB — COMPREHENSIVE METABOLIC PANEL
ALT: 21 U/L (ref 0–44)
AST: 39 U/L (ref 15–41)
Albumin: 3.5 g/dL (ref 3.5–5.0)
Alkaline Phosphatase: 107 U/L (ref 38–126)
Anion gap: 11 (ref 5–15)
BUN: 12 mg/dL (ref 8–23)
CO2: 26 mmol/L (ref 22–32)
Calcium: 9.4 mg/dL (ref 8.9–10.3)
Chloride: 102 mmol/L (ref 98–111)
Creatinine, Ser: 0.69 mg/dL (ref 0.44–1.00)
GFR, Estimated: >60 mL/min (ref >60)
Glucose, Bld: 166 mg/dL — ABNORMAL HIGH (ref 70–99)
Potassium: 4.7 mmol/L (ref 3.5–5.1)
Sodium: 139 mmol/L (ref 135–145)
Total Bilirubin: 0.6 mg/dL (ref 0.3–1.2)
Total Protein: 7.8 g/dL (ref 6.5–8.1)

## 2020-04-17 LAB — PROTIME-INR
INR: 1.3 — ABNORMAL HIGH (ref 0.8–1.2)
Prothrombin Time: 15.4 seconds — ABNORMAL HIGH (ref 11.4–15.2)

## 2020-04-17 LAB — RESP PANEL BY RT-PCR (FLU A&B, COVID) ARPGX2
Influenza A by PCR: NEGATIVE
Influenza B by PCR: NEGATIVE
SARS Coronavirus 2 by RT PCR: NEGATIVE

## 2020-04-17 LAB — AMMONIA: Ammonia: 17 umol/L (ref 9–35)

## 2020-04-17 LAB — LIPASE, BLOOD: Lipase: 23 U/L (ref 11–51)

## 2020-04-17 MED ORDER — PROPOFOL 1000 MG/100ML IV EMUL: 5.0000 ug/kg/min | INTRAVENOUS | Status: DC

## 2020-04-17 MED ORDER — ETOMIDATE 2 MG/ML IV SOLN
INTRAVENOUS | Status: AC | PRN
  Administered 2020-04-17: 20 mg via INTRAVENOUS

## 2020-04-17 MED ORDER — SUCCINYLCHOLINE CHLORIDE 20 MG/ML IJ SOLN
INTRAMUSCULAR | Status: AC | PRN
  Administered 2020-04-17: 100 mg via INTRAVENOUS

## 2020-04-17 MED ORDER — PROPOFOL 1000 MG/100ML IV EMUL
INTRAVENOUS | Status: AC
  Administered 2020-04-17: 5 ug/kg/min via INTRAVENOUS
  Filled 2020-04-17: qty 100

## 2020-04-17 NOTE — ED Triage Notes (Signed)
Pt from home via [facility] unresponsive, LNW 1930 last night. Daughter went to check on pt when pt did not answer phone, pt was found unresponsive on the floor. Pt A&O4 at baseline. No major hx, per daughter pt has "heart problems," on metoprolol. Pt found on floor unresponsive, R sided paralysis L sided facial droop, pupils fixed/dilated. Pt full code at this time  MD Ray at bedside on arrival

## 2020-04-17 NOTE — ED Provider Notes (Signed)
[facility] EMERGENCY DEPARTMENT Provider Note   CSN: [id number] Arrival date & time: 04-26-20  2210     History Chief Complaint  Patient presents with  . unresponsive    Mary Chaney is a 87 y.o. female with h/o Afib on Xarelto and HTN who presents to the ED via EMS from home for unresponsiveness. Per daughter, last known normal yesterday evening at [facility] when she called the patient. When trying to call today, patient did not answer phone. EMS called to scene and found patient unresponsive on floor next to bed. Patient brought to ED for further evaluation. Upon arrival, patient unresponsive.  The history is provided by the patient, a relative, the EMS personnel and medical records. The history is limited by the condition of the patient.  Altered Mental Status Presenting symptoms: unresponsiveness   Severity:  Severe Most recent episode:  Today Episode history:  Single Duration:  1 day Timing:  Unable to specify Progression:  Unchanged Chronicity:  New Context: head injury   Recent head injury:  Unable to specify      Past Medical History:  Diagnosis Date  . Atrial fibrillation (HCC)   . Hemorrhoids   . Hypertension   . Toxic multinodular goiter     Patient Active Problem List   Diagnosis Date Noted  . ICH (intracerebral hemorrhage) (HCC) 04/18/2020  . H/O amiodarone therapy 07/12/2013  . Long term current use of anticoagulant therapy 12/30/2012    Class: Chronic  . Atrial fibrillation (HCC)   . Hypertension   . Toxic multinodular goiter   . Hemorrhoids     No past surgical history on file.   OB History   No obstetric history on file.     Family History  Problem Relation Age of Onset  . Other Mother   . Other Father     Social History   Tobacco Use  . Smoking status: Former Smoker    Packs/day: 1.00    Quit date: 02/25/1983    Years since quitting: 37.1  . Smokeless tobacco: Never Used  Substance Use Topics  . Alcohol use:  No    Alcohol/week: 0.0 standard drinks  . Drug use: No    Home Medications Prior to Admission medications   Medication Sig Start Date End Date Taking? Authorizing Provider  digoxin (LANOXIN) 0.125 MG tablet Take 1 tablet (0.125 mg total) by mouth every Monday, Wednesday, and Friday. 12/24/19  Yes Lyn Records, MD  methimazole (TAPAZOLE) 5 MG tablet Take 5 mg by mouth daily. 04/06/20  Yes [provider]  metoprolol succinate (TOPROL-XL) 50 MG 24 hr tablet Take 1 tablet (50 mg total) by mouth daily. Take with or immediately following a meal. 12/24/19  Yes Lyn Records, MD  nutrition supplement, JUVEN, (JUVEN) PACK Take 1 packet by mouth daily at 2 PM.   Yes [provider]  Rivaroxaban (XARELTO) 15 MG TABS tablet TAKE 1 TABLET (15 MG TOTAL) BY MOUTH DAILY WITH SUPPER. Patient taking differently: Take 15 mg by mouth daily with supper. 12/24/19  Yes Lyn Records, MD  valsartan-hydrochlorothiazide (DIOVAN-HCT) 160-25 MG tablet Take 1 tablet by mouth daily. 12/24/19  Yes Lyn Records, MD    Allergies    Bee venom and Apixaban  Review of Systems   Review of Systems  Unable to perform ROS: Patient unresponsive    Physical Exam Updated Vital Signs BP 123/67   Pulse 94   Temp 100.3 F (37.9 C) (Rectal)  Resp (!) 23   Ht 4\' 10"  (1.473 m)   Wt 59.5 kg   SpO2 99%   BMI 27.42 kg/m   Physical Exam Vitals and nursing note reviewed.  Constitutional:      General: She is in acute distress.     Appearance: She is normal weight. She is ill-appearing.     Interventions: Face mask in place.     Comments: Unresponsive  HENT:     Head: Normocephalic and atraumatic. No contusion or laceration.     Mouth/Throat:     Lips: Pink. No lesions.     Mouth: No angioedema.     Dentition: Has dentures.     Tongue: No lesions.     Palate: No lesions.     Pharynx: Oropharynx is clear. Uvula midline. No pharyngeal swelling, oropharyngeal exudate, posterior oropharyngeal  erythema or uvula swelling.     Tonsils: No tonsillar exudate or tonsillar abscesses.  Eyes:     General: No scleral icterus.       Right eye: No discharge.        Left eye: No discharge.     Conjunctiva/sclera: Conjunctivae normal.  Neck:     Comments: Tracheal midline Cardiovascular:     Rate and Rhythm: Normal rate and regular rhythm.     Pulses:          Radial pulses are 2+ on the right side and 2+ on the left side.       Dorsalis pedis pulses are 2+ on the right side and 2+ on the left side.  Pulmonary:     Effort: Pulmonary effort is normal. No respiratory distress.     Breath sounds: Normal breath sounds. No wheezing, rhonchi or rales.  Abdominal:     General: Abdomen is flat. There is no distension.     Palpations: Abdomen is soft.     Tenderness: There is no abdominal tenderness. There is no guarding or rebound.     Hernia: A hernia is present. Hernia is present in the left inguinal area.  Musculoskeletal:     Right lower leg: No edema.     Left lower leg: No edema.  Skin:    General: Skin is warm and dry.     Findings: No rash.     Comments: Chronic skin changes to BLT lower legs  Neurological:     Mental Status: She is unresponsive.     GCS: GCS eye subscore is 1. GCS verbal subscore is 1. GCS motor subscore is 4.     Comments: Withdrawals LUE and LLE to pain. No movement to RUE and RLE.     ED Results / Procedures / Treatments   Labs (all labs ordered are listed, but only abnormal results are displayed) Labs Reviewed  CBC WITH DIFFERENTIAL/PLATELET - Abnormal; Notable for the following components:      Result Value   Lymphs Abs 0.6 (*)    All other components within normal limits  COMPREHENSIVE METABOLIC PANEL - Abnormal; Notable for the following components:   Glucose, Bld 166 (*)    All other components within normal limits  PROTIME-INR - Abnormal; Notable for the following components:   Prothrombin Time 15.4 (*)    INR 1.3 (*)    All other components  within normal limits  I-STAT ARTERIAL BLOOD GAS, ED - Abnormal; Notable for the following components:   pH, Arterial 7.470 (*)    pO2, Arterial 371 (*)    Bicarbonate 29.2 (*)  Acid-Base Excess 5.0 (*)    Potassium 3.4 (*)    All other components within normal limits  RESP PANEL BY RT-PCR (FLU A&B, COVID) ARPGX2  URINE CULTURE  CULTURE, BLOOD (ROUTINE X 2)  CULTURE, BLOOD (ROUTINE X 2)  LIPASE, BLOOD  AMMONIA  URINALYSIS, ROUTINE W REFLEX MICROSCOPIC  HEPARIN LEVEL (UNFRACTIONATED)  APTT  SODIUM  SODIUM  SODIUM  CBC  BASIC METABOLIC PANEL  BLOOD GAS, ARTERIAL  MAGNESIUM  PHOSPHORUS  TYPE AND SCREEN    EKG None  Radiology CT Head Wo Contrast  Result Date: 04/18/2020 CLINICAL DATA:  Found down at 19:00 on 04/16/2020, posturing, history of heart disease EXAM: CT HEAD WITHOUT CONTRAST CT CERVICAL SPINE WITHOUT CONTRAST CT CHEST, ABDOMEN AND PELVIS WITH CONTRAST TECHNIQUE: Contiguous axial images were obtained from the base of the skull through the vertex without intravenous contrast. Multidetector CT imaging of the cervical spine was performed without intravenous contrast. Multiplanar CT image reconstructions were also generated. Multidetector CT imaging of the chest, abdomen and pelvis was performed following the standard protocol during bolus administration of intravenous contrast. CONTRAST:  OMNIPAQUE IOHEXOL 300 MG/ML  SOLN COMPARISON:  CT head and chest angiography 11/26/2015 FINDINGS: CT HEAD FINDINGS Brain: Massive intracerebral hemorrhage within the left frontoparietal lobes measuring up to 8 x 5.6 x 5.8 cm in size. Extensive surrounding hypoattenuating edema. Significant mass effect with global sulcal effacement of the left cerebral hemisphere, effacement of the left lateral ventricle, 18 mm of left to right midline shift, medial ization of the uncus and indentation of the contralateral cerebral crus against the right tentorium cerebelli. No other acute sites of  intracranial hemorrhage or extra-axial collection. Findings on a background of diffuse parenchymal volume loss. Patchy areas of white matter hypoattenuation are most compatible with chronic microvascular angiopathy. Vascular: Atherosclerotic calcification of the carotid siphons and intradural vertebral arteries. No hyperdense vessel. Skull: Right temporal and left parieto-occipital scalp swelling and thickening. No subjacent calvarial fractures. Mild edematous changes of the scalp. Sinuses/Orbits: Diffuse thickening in the ethmoids with pneumatized secretions in the posterior nasopharynx and nasal passages. Remaining paranasal sinuses and mastoid air cells are predominantly clear. Middle ear cavities are clear. Orbital structures are unremarkable aside from prior lens extractions. Other: Edentulous.  Transesophageal and endotracheal tubes in place. CT CERVICAL FINDINGS Alignment: Slight reversal of normal cervical lordosis at the C2-3 level. Minimal anterolisthesis C2 on C3 favored to be on a degenerative basis. Similar likely degenerative anterolisthesis noted C7 on T1. No evidence of traumatic listhesis. No abnormally widened, perched or jumped facets. Normal alignment of the craniocervical and atlantoaxial articulations accounting for rightward cranial rotation and slight rightward lateral cervical flexion. Skull base and vertebrae: The osseous structures appear diffusely demineralized which may limit detection of small or nondisplaced fractures. No acute skull base fracture. No vertebral body fracture or height loss. Soft tissues and spinal canal: No prevertebral swelling, fluid or gas. Mild edematous changes noted along the nuchal ligament and cervical musculature posteriorly. Endotracheal and transesophageal tubes are in place. Secretions noted in the posterior naso and oropharynx as imaged. Cervical carotid atherosclerosis. Disc levels: Multilevel intervertebral disc height loss with spondylitic endplate  changes. Larger disc osteophyte complexes are present C2-3, C3-4 and C6-7 which partially efface the ventral thecal sac without significant resulting canal impingement. Multilevel uncinate spurring and facet hypertrophic changes result in mild-to-moderate foraminal narrowing most pronounced at the upper cervical levels. Other: Massively enlarged, heterogeneous multinodular thyroid gland extending inferiorly into the thoracic inlet within overall appearance,  similar to comparison CT chest imaging. CT CHEST FINDINGS Cardiovascular: Cardiomegaly with predominantly biatrial enlargement. Coronary artery calcifications. Small pericardial effusion with fluid in the pericardial recesses. Atherosclerotic plaque within the normal caliber aorta. No acute luminal abnormality of the imaged aorta. No periaortic stranding or hemorrhage. Normal 3 vessel branching of the aortic arch. Shared origin of the brachiocephalic and left common carotid arteries. Splaying of the proximal great vessels with tortuosity secondary to large thyroid extending into the thoracic inlet. Mediastinum/Nodes: Massively enlarged, heterogeneous and multinodular thyroid gland. Extension of the thyroid gland into the thoracic thyroid dimensions. The left lobe thyroid gland measures up to 5.6 x 6.5 x 11.2 cm in conglomerate dimension. More moderate enlargement and nodularity of the right gland. Resulting right paratracheal esophageal shift. Endotracheal tube tip terminates low in the trachea, 1 cm from the carina. Recommend retraction 2 cm to the mid trachea. Transesophageal tube tip terminates in the gastric body with side port distal to the GE junction. This type the presence of the transesophageal tube, the thoracic esophagus is patulous and fluid-filled. No worrisome mediastinal, hilar or axillary adenopathy. Lungs/Pleura: Small bilateral pleural effusions. Mixed dependent and passive atelectatic changes in the lungs. Additional bandlike areas of scarring  and/or subsegmental atelectasis. Redemonstration of a solid nodule seen in the superior segment left lower lobe this measures 12 x 14 by 18 mm, previously 8 x 11 x 9 mm reflecting interval growth however the attenuation of this structure closely matches that of simple fluid. Musculoskeletal: Multilevel degenerative changes are present in the imaged portions of the spine. Features most pronounced T10-11. Dextrocurvature of the midthoracic spine. Mild levocurvature at the cervicothoracic junction. Additional degenerative changes in the bilateral shoulders. No acute or worrisome osseous lesion. Extensive circumferential body wall edema noted. CT ABDOMEN PELVIS FINDINGS Hepatobiliary: Some diffuse geographic regions of a [place] hypoattenuation with more focal sparing in the left lobe liver. More uniform enhancement on the delayed phase imaging. Hypoattenuating 1.7 cm lesion in the tip of the left lobe liver with some nodular discontinuous peripheral enhancement and centripetal filling on delayed phase imaging most suggestive of a hepatic hemangioma. Smooth liver surface contour. Normal gallbladder and. No significant biliary ductal dilatation. Pancreas: No pancreatic ductal dilatation or surrounding inflammatory changes. Spleen: Normal in size. No concerning splenic lesions. Adrenals/Urinary Tract: Normal adrenals. Kidneys are normally located with symmetric enhancement and excretion. Few small fluid attenuation cysts are present bilaterally. Additional subcentimeter hypoattenuating foci seen in both kidneys too small to fully characterize on CT imaging but statistically likely benign. There are no suspicious renal lesion, urolithiasis or hydronephrosis. Moderate distention of the urinary bladder without other gross abnormality. Stomach/Bowel: Transesophageal tube tip and side port distal to the GE junction. Some mild thickening towards the gastric antrum postulated to peristaltic motion some mild diffuse mural  thickening of both large and small bowel. Multiple to of small bowel loops are seen protruding into a large left groin hernia albeit without evidence of mechanical obstruction or more focal thickening to suggest strangulation/vascular compromise at this time. No other sites of bowel obstruction. Normal appendix. Scattered colonic diverticula without focal inflammation to suggest diverticulitis. Vascular/Lymphatic: Atherosclerotic calcifications within the abdominal aorta and branch vessels. No aneurysm or ectasia. Enlarged right groin adenopathy with low-attenuation nodes measuring up to 13 mm short axis (3/103). No other enlarged abdominopelvic nodes. Reproductive: Numerous heterogeneously calcified uterine fibroids are present. Ovarian tissue is poorly visualized though no discrete concerning adnexal lesions are seen. Other: Large left groin hernia containing both fat  and bowel, as described above. An additional ventral midline fat containing hernia containing small amount of low-attenuation fluid and extensive stranding of the herniated fat contents suggestive of least mild strangulation reactive change. No other bowel containing hernias. Extensive circumferential body wall edema. Musculoskeletal: Multilevel degenerative changes are present in the imaged portions of the spine. Grade 1 anterolisthesis L4 on 5 without spondylolysis. Partial ankylosis of the L5-S1 levels. Additional moderate degenerative change in the bilateral hips and SI joints. IMPRESSION: CT head: 1. Intracerebral hemorrhage within the left frontoparietal lobes measuring up to 8 x 5.6 x 5.8 cm in size. Extensive surrounding hypoattenuating edema. Significant mass effect with global sulcal effacement of the left cerebral hemisphere, 18 mm of left to right midline shift, medialization of the left uncus and indentation of the contralateral right cerebral crus against the right tentorium cerebelli. 2. Background of diffuse parenchymal volume loss  and microvascular angiopathy with intracranial atherosclerosis. 3. Extensive sites of scalp swelling versus edema. Correlate for tenderness. Findings most pronounced in the right temporal and left parieto-occipital regions. CT cervical spine: 1. No acute fracture or traumatic listhesis of the cervical spine. 2. Diffuse edematous changes of the posterior cervical musculature, left greater than right. Possibly related to more diffuse anasarca changes elsewhere. 3. Multilevel cervical spondylitic changes as detailed above. CT chest, abdomen and pelvis 1. Endotracheal tube tip terminates low in the trachea, 1 cm from the carina. Recommend retraction 2 cm to the mid trachea. 2. Appropriate positioning of the endotracheal tube. 3. Features could suggest underlying CHF/heart failure with cardiomegaly small bilateral effusions and pulmonary vascular congestion. Additional diffuse features of anasarca with circumferential body wall edema. Heterogeneous enhancement of the liver, may reflect some underlying hepatic steatosis or altered hepatic perfusion dynamics or hepatic congestion in the setting of heart failure. 4. Mixed dependent and passive atelectatic changes in the lung bases. 5. Massively enlarged, heterogeneous and multinodular thyroid gland, with extension of the thyroid gland into the thoracic inlet. 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 Similar appearance to comparison exam and in the setting of significant comorbidities or limited life expectancy, no follow-up recommended (ref: J Am Coll Radiol. 2015 Feb;12(2): 143-50). 6. Redemonstration of a solid nodule in the superior segment left lower lobe this measures 12 x 14 x 18 mm, previously 8 x 11 x 9 mm reflecting interval growth however the attenuation of this structure closely matches that of simple fluid. Overall indeterminate though malignancy not fully excluded. Consider continued follow-up imaging if felt to be clinically warranted. 7. Fat and  bowel containing left groin hernia without evidence of mechanical obstruction or strangulation at this level. 8. Additional fat containing upper abdominal ventral hernia with fatty stranding and trace free fluid concerning for fat strangulation. 9. Diffusely edematous parents of both large and small bowel, could correlate for clinical features of an enterocolitis, either infectious or inflammatory. Critical results were called by telephone at the time of interpretation on 04/18/2020 at 12:58 am to provider Dr. Mellody Dance, who verbally acknowledged these results. Electronically Signed   By: Kreg Shropshire M.D.   On: 04/18/2020 01:24   CT Cervical Spine Wo Contrast  Result Date: 04/18/2020 CLINICAL DATA:  Found down at 19:00 on 04/16/2020, posturing, history of heart disease EXAM: CT HEAD WITHOUT CONTRAST CT CERVICAL SPINE WITHOUT CONTRAST CT CHEST, ABDOMEN AND PELVIS WITH CONTRAST TECHNIQUE: Contiguous axial images were obtained from the base of the skull through the vertex without intravenous contrast. Multidetector CT imaging of the cervical spine was performed  without intravenous contrast. Multiplanar CT image reconstructions were also generated. Multidetector CT imaging of the chest, abdomen and pelvis was performed following the standard protocol during bolus administration of intravenous contrast. CONTRAST:  100mL OMNIPAQUE IOHEXOL 300 MG/ML  SOLN COMPARISON:  CT head and chest angiography 11/26/2015 FINDINGS: CT HEAD FINDINGS Brain: Massive intracerebral hemorrhage within the left frontoparietal lobes measuring up to 8 x 5.6 x 5.8 cm in size. Extensive surrounding hypoattenuating edema. Significant mass effect with global sulcal effacement of the left cerebral hemisphere, effacement of the left lateral ventricle, 18 mm of left to right midline shift, medial ization of the uncus and indentation of the contralateral cerebral crus against the right tentorium cerebelli. No other acute sites of intracranial hemorrhage  or extra-axial collection. Findings on a background of diffuse parenchymal volume loss. Patchy areas of white matter hypoattenuation are most compatible with chronic microvascular angiopathy. Vascular: Atherosclerotic calcification of the carotid siphons and intradural vertebral arteries. No hyperdense vessel. Skull: Right temporal and left parieto-occipital scalp swelling and thickening. No subjacent calvarial fractures. Mild edematous changes of the scalp. Sinuses/Orbits: Diffuse thickening in the ethmoids with pneumatized secretions in the posterior nasopharynx and nasal passages. Remaining paranasal sinuses and mastoid air cells are predominantly clear. Middle ear cavities are clear. Orbital structures are unremarkable aside from prior lens extractions. Other: Edentulous.  Transesophageal and endotracheal tubes in place. CT CERVICAL FINDINGS Alignment: Slight reversal of normal cervical lordosis at the C2-3 level. Minimal anterolisthesis C2 on C3 favored to be on a degenerative basis. Similar likely degenerative anterolisthesis noted C7 on T1. No evidence of traumatic listhesis. No abnormally widened, perched or jumped facets. Normal alignment of the craniocervical and atlantoaxial articulations accounting for rightward cranial rotation and slight rightward lateral cervical flexion. Skull base and vertebrae: The osseous structures appear diffusely demineralized which may limit detection of small or nondisplaced fractures. No acute skull base fracture. No vertebral body fracture or height loss. Soft tissues and spinal canal: No prevertebral swelling, fluid or gas. Mild edematous changes noted along the nuchal ligament and cervical musculature posteriorly. Endotracheal and transesophageal tubes are in place. Secretions noted in the posterior naso and oropharynx as imaged. Cervical carotid atherosclerosis. Disc levels: Multilevel intervertebral disc height loss with spondylitic endplate changes. Larger disc  osteophyte complexes are present C2-3, C3-4 and C6-7 which partially efface the ventral thecal sac without significant resulting canal impingement. Multilevel uncinate spurring and facet hypertrophic changes result in mild-to-moderate foraminal narrowing most pronounced at the upper cervical levels. Other: Massively enlarged, heterogeneous multinodular thyroid gland extending inferiorly into the thoracic inlet within overall appearance, similar to comparison CT chest imaging. CT CHEST FINDINGS Cardiovascular: Cardiomegaly with predominantly biatrial enlargement. Coronary artery calcifications. Small pericardial effusion with fluid in the pericardial recesses. Atherosclerotic plaque within the normal caliber aorta. No acute luminal abnormality of the imaged aorta. No periaortic stranding or hemorrhage. Normal 3 vessel branching of the aortic arch. Shared origin of the brachiocephalic and left common carotid arteries. Splaying of the proximal great vessels with tortuosity secondary to large thyroid extending into the thoracic inlet. Mediastinum/Nodes: Massively enlarged, heterogeneous and multinodular thyroid gland. Extension of the thyroid gland into the thoracic thyroid dimensions. The left lobe thyroid gland measures up to 5.6 x 6.5 x 11.2 cm in conglomerate dimension. More moderate enlargement and nodularity of the right gland. Resulting right paratracheal esophageal shift. Endotracheal tube tip terminates low in the trachea, 1 cm from the carina. Recommend retraction 2 cm to the mid trachea. Transesophageal tube tip terminates in  the gastric body with side port distal to the GE junction. This type the presence of the transesophageal tube, the thoracic esophagus is patulous and fluid-filled. No worrisome mediastinal, hilar or axillary adenopathy. Lungs/Pleura: Small bilateral pleural effusions. Mixed dependent and passive atelectatic changes in the lungs. Additional bandlike areas of scarring and/or subsegmental  atelectasis. Redemonstration of a solid nodule seen in the superior segment left lower lobe this measures 12 x 14 by 18 mm, previously 8 x 11 x 9 mm reflecting interval growth however the attenuation of this structure closely matches that of simple fluid. Musculoskeletal: Multilevel degenerative changes are present in the imaged portions of the spine. Features most pronounced T10-11. Dextrocurvature of the midthoracic spine. Mild levocurvature at the cervicothoracic junction. Additional degenerative changes in the bilateral shoulders. No acute or worrisome osseous lesion. Extensive circumferential body wall edema noted. CT ABDOMEN PELVIS FINDINGS Hepatobiliary: Some diffuse geographic regions of a [place] hypoattenuation with more focal sparing in the left lobe liver. More uniform enhancement on the delayed phase imaging. Hypoattenuating 1.7 cm lesion in the tip of the left lobe liver with some nodular discontinuous peripheral enhancement and centripetal filling on delayed phase imaging most suggestive of a hepatic hemangioma. Smooth liver surface contour. Normal gallbladder and. No significant biliary ductal dilatation. Pancreas: No pancreatic ductal dilatation or surrounding inflammatory changes. Spleen: Normal in size. No concerning splenic lesions. Adrenals/Urinary Tract: Normal adrenals. Kidneys are normally located with symmetric enhancement and excretion. Few small fluid attenuation cysts are present bilaterally. Additional subcentimeter hypoattenuating foci seen in both kidneys too small to fully characterize on CT imaging but statistically likely benign. There are no suspicious renal lesion, urolithiasis or hydronephrosis. Moderate distention of the urinary bladder without other gross abnormality. Stomach/Bowel: Transesophageal tube tip and side port distal to the GE junction. Some mild thickening towards the gastric antrum postulated to peristaltic motion some mild diffuse mural thickening of both large  and small bowel. Multiple to of small bowel loops are seen protruding into a large left groin hernia albeit without evidence of mechanical obstruction or more focal thickening to suggest strangulation/vascular compromise at this time. No other sites of bowel obstruction. Normal appendix. Scattered colonic diverticula without focal inflammation to suggest diverticulitis. Vascular/Lymphatic: Atherosclerotic calcifications within the abdominal aorta and branch vessels. No aneurysm or ectasia. Enlarged right groin adenopathy with low-attenuation nodes measuring up to 13 mm short axis (3/103). No other enlarged abdominopelvic nodes. Reproductive: Numerous heterogeneously calcified uterine fibroids are present. Ovarian tissue is poorly visualized though no discrete concerning adnexal lesions are seen. Other: Large left groin hernia containing both fat and bowel, as described above. An additional ventral midline fat containing hernia containing small amount of low-attenuation fluid and extensive stranding of the herniated fat contents suggestive of least mild strangulation reactive change. No other bowel containing hernias. Extensive circumferential body wall edema. Musculoskeletal: Multilevel degenerative changes are present in the imaged portions of the spine. Grade 1 anterolisthesis L4 on 5 without spondylolysis. Partial ankylosis of the L5-S1 levels. Additional moderate degenerative change in the bilateral hips and SI joints. IMPRESSION: CT head: 1. Intracerebral hemorrhage within the left frontoparietal lobes measuring up to 8 x 5.6 x 5.8 cm in size. Extensive surrounding hypoattenuating edema. Significant mass effect with global sulcal effacement of the left cerebral hemisphere, 18 mm of left to right midline shift, medialization of the left uncus and indentation of the contralateral right cerebral crus against the right tentorium cerebelli. 2. Background of diffuse parenchymal volume loss and microvascular  angiopathy with  intracranial atherosclerosis. 3. Extensive sites of scalp swelling versus edema. Correlate for tenderness. Findings most pronounced in the right temporal and left parieto-occipital regions. CT cervical spine: 1. No acute fracture or traumatic listhesis of the cervical spine. 2. Diffuse edematous changes of the posterior cervical musculature, left greater than right. Possibly related to more diffuse anasarca changes elsewhere. 3. Multilevel cervical spondylitic changes as detailed above. CT chest, abdomen and pelvis 1. Endotracheal tube tip terminates low in the trachea, 1 cm from the carina. Recommend retraction 2 cm to the mid trachea. 2. Appropriate positioning of the endotracheal tube. 3. Features could suggest underlying CHF/heart failure with cardiomegaly small bilateral effusions and pulmonary vascular congestion. Additional diffuse features of anasarca with circumferential body wall edema. Heterogeneous enhancement of the liver, may reflect some underlying hepatic steatosis or altered hepatic perfusion dynamics or hepatic congestion in the setting of heart failure. 4. Mixed dependent and passive atelectatic changes in the lung bases. 5. Massively enlarged, heterogeneous and multinodular thyroid gland, with extension of the thyroid gland into the thoracic inlet. 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 Similar appearance to comparison exam and in the setting of significant comorbidities or limited life expectancy, no follow-up recommended (ref: J Am Coll Radiol. 2015 Feb;12(2): 143-50). 6. Redemonstration of a solid nodule in the superior segment left lower lobe this measures 12 x 14 x 18 mm, previously 8 x 11 x 9 mm reflecting interval growth however the attenuation of this structure closely matches that of simple fluid. Overall indeterminate though malignancy not fully excluded. Consider continued follow-up imaging if felt to be clinically warranted. 7. Fat and bowel containing  left groin hernia without evidence of mechanical obstruction or strangulation at this level. 8. Additional fat containing upper abdominal ventral hernia with fatty stranding and trace free fluid concerning for fat strangulation. 9. Diffusely edematous parents of both large and small bowel, could correlate for clinical features of an enterocolitis, either infectious or inflammatory. Critical results were called by telephone at the time of interpretation on 04/18/2020 at 12:58 am to provider Dr. Mellody Dance, who verbally acknowledged these results. Electronically Signed   By: Kreg Shropshire M.D.   On: 04/18/2020 01:24   CT CHEST ABDOMEN PELVIS W CONTRAST  Result Date: 04/18/2020 CLINICAL DATA:  Found down at 19:00 on 04/16/2020, posturing, history of heart disease EXAM: CT HEAD WITHOUT CONTRAST CT CERVICAL SPINE WITHOUT CONTRAST CT CHEST, ABDOMEN AND PELVIS WITH CONTRAST TECHNIQUE: Contiguous axial images were obtained from the base of the skull through the vertex without intravenous contrast. Multidetector CT imaging of the cervical spine was performed without intravenous contrast. Multiplanar CT image reconstructions were also generated. Multidetector CT imaging of the chest, abdomen and pelvis was performed following the standard protocol during bolus administration of intravenous contrast. CONTRAST:  OMNIPAQUE IOHEXOL 300 MG/ML  SOLN COMPARISON:  CT head and chest angiography 11/26/2015 FINDINGS: CT HEAD FINDINGS Brain: Massive intracerebral hemorrhage within the left frontoparietal lobes measuring up to 8 x 5.6 x 5.8 cm in size. Extensive surrounding hypoattenuating edema. Significant mass effect with global sulcal effacement of the left cerebral hemisphere, effacement of the left lateral ventricle, 18 mm of left to right midline shift, medial ization of the uncus and indentation of the contralateral cerebral crus against the right tentorium cerebelli. No other acute sites of intracranial hemorrhage or  extra-axial collection. Findings on a background of diffuse parenchymal volume loss. Patchy areas of white matter hypoattenuation are most compatible with chronic microvascular angiopathy. Vascular: Atherosclerotic calcification of  the carotid siphons and intradural vertebral arteries. No hyperdense vessel. Skull: Right temporal and left parieto-occipital scalp swelling and thickening. No subjacent calvarial fractures. Mild edematous changes of the scalp. Sinuses/Orbits: Diffuse thickening in the ethmoids with pneumatized secretions in the posterior nasopharynx and nasal passages. Remaining paranasal sinuses and mastoid air cells are predominantly clear. Middle ear cavities are clear. Orbital structures are unremarkable aside from prior lens extractions. Other: Edentulous.  Transesophageal and endotracheal tubes in place. CT CERVICAL FINDINGS Alignment: Slight reversal of normal cervical lordosis at the C2-3 level. Minimal anterolisthesis C2 on C3 favored to be on a degenerative basis. Similar likely degenerative anterolisthesis noted C7 on T1. No evidence of traumatic listhesis. No abnormally widened, perched or jumped facets. Normal alignment of the craniocervical and atlantoaxial articulations accounting for rightward cranial rotation and slight rightward lateral cervical flexion. Skull base and vertebrae: The osseous structures appear diffusely demineralized which may limit detection of small or nondisplaced fractures. No acute skull base fracture. No vertebral body fracture or height loss. Soft tissues and spinal canal: No prevertebral swelling, fluid or gas. Mild edematous changes noted along the nuchal ligament and cervical musculature posteriorly. Endotracheal and transesophageal tubes are in place. Secretions noted in the posterior naso and oropharynx as imaged. Cervical carotid atherosclerosis. Disc levels: Multilevel intervertebral disc height loss with spondylitic endplate changes. Larger disc osteophyte  complexes are present C2-3, C3-4 and C6-7 which partially efface the ventral thecal sac without significant resulting canal impingement. Multilevel uncinate spurring and facet hypertrophic changes result in mild-to-moderate foraminal narrowing most pronounced at the upper cervical levels. Other: Massively enlarged, heterogeneous multinodular thyroid gland extending inferiorly into the thoracic inlet within overall appearance, similar to comparison CT chest imaging. CT CHEST FINDINGS Cardiovascular: Cardiomegaly with predominantly biatrial enlargement. Coronary artery calcifications. Small pericardial effusion with fluid in the pericardial recesses. Atherosclerotic plaque within the normal caliber aorta. No acute luminal abnormality of the imaged aorta. No periaortic stranding or hemorrhage. Normal 3 vessel branching of the aortic arch. Shared origin of the brachiocephalic and left common carotid arteries. Splaying of the proximal great vessels with tortuosity secondary to large thyroid extending into the thoracic inlet. Mediastinum/Nodes: Massively enlarged, heterogeneous and multinodular thyroid gland. Extension of the thyroid gland into the thoracic thyroid dimensions. The left lobe thyroid gland measures up to 5.6 x 6.5 x 11.2 cm in conglomerate dimension. More moderate enlargement and nodularity of the right gland. Resulting right paratracheal esophageal shift. Endotracheal tube tip terminates low in the trachea, 1 cm from the carina. Recommend retraction 2 cm to the mid trachea. Transesophageal tube tip terminates in the gastric body with side port distal to the GE junction. This type the presence of the transesophageal tube, the thoracic esophagus is patulous and fluid-filled. No worrisome mediastinal, hilar or axillary adenopathy. Lungs/Pleura: Small bilateral pleural effusions. Mixed dependent and passive atelectatic changes in the lungs. Additional bandlike areas of scarring and/or subsegmental atelectasis.  Redemonstration of a solid nodule seen in the superior segment left lower lobe this measures 12 x 14 by 18 mm, previously 8 x 11 x 9 mm reflecting interval growth however the attenuation of this structure closely matches that of simple fluid. Musculoskeletal: Multilevel degenerative changes are present in the imaged portions of the spine. Features most pronounced T10-11. Dextrocurvature of the midthoracic spine. Mild levocurvature at the cervicothoracic junction. Additional degenerative changes in the bilateral shoulders. No acute or worrisome osseous lesion. Extensive circumferential body wall edema noted. CT ABDOMEN PELVIS FINDINGS Hepatobiliary: Some diffuse geographic regions of  a [place] hypoattenuation with more focal sparing in the left lobe liver. More uniform enhancement on the delayed phase imaging. Hypoattenuating 1.7 cm lesion in the tip of the left lobe liver with some nodular discontinuous peripheral enhancement and centripetal filling on delayed phase imaging most suggestive of a hepatic hemangioma. Smooth liver surface contour. Normal gallbladder and. No significant biliary ductal dilatation. Pancreas: No pancreatic ductal dilatation or surrounding inflammatory changes. Spleen: Normal in size. No concerning splenic lesions. Adrenals/Urinary Tract: Normal adrenals. Kidneys are normally located with symmetric enhancement and excretion. Few small fluid attenuation cysts are present bilaterally. Additional subcentimeter hypoattenuating foci seen in both kidneys too small to fully characterize on CT imaging but statistically likely benign. There are no suspicious renal lesion, urolithiasis or hydronephrosis. Moderate distention of the urinary bladder without other gross abnormality. Stomach/Bowel: Transesophageal tube tip and side port distal to the GE junction. Some mild thickening towards the gastric antrum postulated to peristaltic motion some mild diffuse mural thickening of both large and small  bowel. Multiple to of small bowel loops are seen protruding into a large left groin hernia albeit without evidence of mechanical obstruction or more focal thickening to suggest strangulation/vascular compromise at this time. No other sites of bowel obstruction. Normal appendix. Scattered colonic diverticula without focal inflammation to suggest diverticulitis. Vascular/Lymphatic: Atherosclerotic calcifications within the abdominal aorta and branch vessels. No aneurysm or ectasia. Enlarged right groin adenopathy with low-attenuation nodes measuring up to 13 mm short axis (3/103). No other enlarged abdominopelvic nodes. Reproductive: Numerous heterogeneously calcified uterine fibroids are present. Ovarian tissue is poorly visualized though no discrete concerning adnexal lesions are seen. Other: Large left groin hernia containing both fat and bowel, as described above. An additional ventral midline fat containing hernia containing small amount of low-attenuation fluid and extensive stranding of the herniated fat contents suggestive of least mild strangulation reactive change. No other bowel containing hernias. Extensive circumferential body wall edema. Musculoskeletal: Multilevel degenerative changes are present in the imaged portions of the spine. Grade 1 anterolisthesis L4 on 5 without spondylolysis. Partial ankylosis of the L5-S1 levels. Additional moderate degenerative change in the bilateral hips and SI joints. IMPRESSION: CT head: 1. Intracerebral hemorrhage within the left frontoparietal lobes measuring up to 8 x 5.6 x 5.8 cm in size. Extensive surrounding hypoattenuating edema. Significant mass effect with global sulcal effacement of the left cerebral hemisphere, 18 mm of left to right midline shift, medialization of the left uncus and indentation of the contralateral right cerebral crus against the right tentorium cerebelli. 2. Background of diffuse parenchymal volume loss and microvascular angiopathy with  intracranial atherosclerosis. 3. Extensive sites of scalp swelling versus edema. Correlate for tenderness. Findings most pronounced in the right temporal and left parieto-occipital regions. CT cervical spine: 1. No acute fracture or traumatic listhesis of the cervical spine. 2. Diffuse edematous changes of the posterior cervical musculature, left greater than right. Possibly related to more diffuse anasarca changes elsewhere. 3. Multilevel cervical spondylitic changes as detailed above. CT chest, abdomen and pelvis 1. Endotracheal tube tip terminates low in the trachea, 1 cm from the carina. Recommend retraction 2 cm to the mid trachea. 2. Appropriate positioning of the endotracheal tube. 3. Features could suggest underlying CHF/heart failure with cardiomegaly small bilateral effusions and pulmonary vascular congestion. Additional diffuse features of anasarca with circumferential body wall edema. Heterogeneous enhancement of the liver, may reflect some underlying hepatic steatosis or altered hepatic perfusion dynamics or hepatic congestion in the setting of heart failure. 4. Mixed dependent and  passive atelectatic changes in the lung bases. 5. Massively enlarged, heterogeneous and multinodular thyroid gland, with extension of the thyroid gland into the thoracic inlet. 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 Similar appearance to comparison exam and in the setting of significant comorbidities or limited life expectancy, no follow-up recommended (ref: J Am Coll Radiol. 2015 Feb;12(2): 143-50). 6. Redemonstration of a solid nodule in the superior segment left lower lobe this measures 12 x 14 x 18 mm, previously 8 x 11 x 9 mm reflecting interval growth however the attenuation of this structure closely matches that of simple fluid. Overall indeterminate though malignancy not fully excluded. Consider continued follow-up imaging if felt to be clinically warranted. 7. Fat and bowel containing left groin hernia  without evidence of mechanical obstruction or strangulation at this level. 8. Additional fat containing upper abdominal ventral hernia with fatty stranding and trace free fluid concerning for fat strangulation. 9. Diffusely edematous parents of both large and small bowel, could correlate for clinical features of an enterocolitis, either infectious or inflammatory. Critical results were called by telephone at the time of interpretation on 04/18/2020 at 12:58 am to provider Dr. Mellody Dance, who verbally acknowledged these results. Electronically Signed   By: Kreg Shropshire M.D.   On: 04/18/2020 01:24   DG Chest Portable 1 View  Result Date: 05/04/20 CLINICAL DATA:  Intubated EXAM: PORTABLE CHEST 1 VIEW COMPARISON:  11/26/2015 chest radiograph and CT FINDINGS: Endotracheal tube tip less than a cm superior to the carina. Tracheal deviation to the right secondary to known enlarged substernal thyroid. Esophageal tube tip below the diaphragm but incompletely visualized. Cardiomegaly with central vascular congestion. Aortic atherosclerosis. No pneumothorax. Possible developing consolidation at the left lung base. IMPRESSION: 1. Endotracheal tube tip less than a cm superior to the carina. 2. Cardiomegaly with vascular congestion and possible developing consolidation/pneumonia at the left lung base. 3. Left paratracheal mass with deviation of trachea to the right, corresponding to known massively enlarged substernal thyroid. Electronically Signed   By: Jasmine Pang M.D.   On: 05-04-2020 22:49    Procedures Procedure Name: Intubation Date/Time: 2020/05/04 10:42 PM Performed by: Tonia Brooms, MD Pre-anesthesia Checklist: Patient identified, Suction available, Patient being monitored, Emergency Drugs available and Timeout performed Oxygen Delivery Method: Non-rebreather mask Preoxygenation: Pre-oxygenation with 100% oxygen Induction Type: Rapid sequence Ventilation: Nasal airway inserted- appropriate to patient  size Laryngoscope Size: Glidescope and 4 Grade View: Grade I Tube size: 7.5 mm Number of attempts: 1 Airway Equipment and Method: Stylet and Video-laryngoscopy Placement Confirmation: ETT inserted through vocal cords under direct vision,  Positive ETCO2,  CO2 detector and Breath sounds checked- equal and bilateral Secured at: 23 cm Tube secured with: ETT holder Dental Injury: Teeth and Oropharynx as per pre-operative assessment  Difficulty Due To: Difficult Airway- due to dentition       Medications Ordered in ED Medications  propofol (DIPRIVAN) 1000 MG/100ML infusion (0 mcg/kg/min  59.5 kg Intravenous Stopped 04/18/20 0126)  prothrombin complex conc human (KCENTRA) IVPB 3,135 Units (3,135 Units Intravenous New Bag/Given 04/18/20 0126)  methimazole (TAPAZOLE) tablet 5 mg (has no administration in time range)  docusate sodium (COLACE) capsule 100 mg (has no administration in time range)  polyethylene glycol (MIRALAX / GLYCOLAX) packet 17 g (has no administration in time range)  pantoprazole (PROTONIX) EC tablet 40 mg (has no administration in time range)  fentaNYL (SUBLIMAZE) injection 25 mcg (has no administration in time range)  fentaNYL in NS (71mcg/ml) infusion-PREMIX (has no administration in  time range)  fentaNYL (SUBLIMAZE) bolus via infusion 25 mcg (has no administration in time range)  etomidate (AMIDATE) injection (20 mg Intravenous Given 04/07/2020 2230)  succinylcholine (ANECTINE) injection (100 mg Intravenous Given 04/15/2020 2230)  iohexol (OMNIPAQUE) 300 MG/ML solution 100 mL (100 mLs Intravenous Contrast Given 04/18/20 0041)    ED Course  I have reviewed the triage vital signs and the nursing notes.  Pertinent labs & imaging results that were available during my care of the patient were reviewed by me and considered in my medical decision making (see chart for details).    MDM Rules/Calculators/A&P                          Patient is a 89yoF with history  and physical exam as described above who presents to the ED for unresponsiveness. VS reassuring and HDS. Not following commands or opening eyes, withdrawals from pain on the L and no movement on the R side. Patient arrived satting 96% on NRB. ETT placed successful (as described above) for airway protection. Breaths sounds CTAB. Initial BP 150/90. Broad AMS workup initiated with pan CT scans. Patient sedated with propofol. CT scans delayed due to multiple critically ill patients in ED and staffing constraints.  CT head demonstrated large ICH with significant mass effect, 18mm R-to-L shift, and pending uncal herniation. Xarelto subsequently reversed with KCentra and hypertonic saline administered. Discussed patient with Neurosurgery who do not anticipate patient being surgical candidate and recommend Neurology consult. Neurology consulted and will evaluate patient in ED; recommend Xarelto reversal, hypertonic saline, and BP control. Also discussed patient with Critical Care who will admit. Daughter at bedside and updated on diagnostic findings and severity of head bleed. Patient otherwise remained HDS on minimal vent settings in ED. Patient admitted to ICU for further observation and care.  Final Clinical Impression(s) / ED Diagnoses Final diagnoses:  Unresponsive  Intracranial bleed [facility])    Rx / DC Orders ED Discharge Orders    None       Tonia Brooms, MD 04/18/20 0149    Margarita Grizzle, MD 04/19/20 [phone]

## 2020-04-18 ENCOUNTER — Emergency Department (HOSPITAL_COMMUNITY): Payer: Medicare Other

## 2020-04-18 DIAGNOSIS — R29730 NIHSS score 30: Secondary | ICD-10-CM | POA: Diagnosis present

## 2020-04-18 DIAGNOSIS — Z87891 Personal history of nicotine dependence: Secondary | ICD-10-CM | POA: Diagnosis not present

## 2020-04-18 DIAGNOSIS — J9600 Acute respiratory failure, unspecified whether with hypoxia or hypercapnia: Secondary | ICD-10-CM

## 2020-04-18 DIAGNOSIS — I611 Nontraumatic intracerebral hemorrhage in hemisphere, cortical: Secondary | ICD-10-CM | POA: Diagnosis present

## 2020-04-18 DIAGNOSIS — R4189 Other symptoms and signs involving cognitive functions and awareness: Secondary | ICD-10-CM | POA: Insufficient documentation

## 2020-04-18 DIAGNOSIS — I4819 Other persistent atrial fibrillation: Secondary | ICD-10-CM | POA: Diagnosis not present

## 2020-04-18 DIAGNOSIS — Z91030 Bee allergy status: Secondary | ICD-10-CM | POA: Diagnosis not present

## 2020-04-18 DIAGNOSIS — Z888 Allergy status to other drugs, medicaments and biological substances status: Secondary | ICD-10-CM | POA: Diagnosis not present

## 2020-04-18 DIAGNOSIS — I619 Nontraumatic intracerebral hemorrhage, unspecified: Secondary | ICD-10-CM | POA: Diagnosis present

## 2020-04-18 DIAGNOSIS — I482 Chronic atrial fibrillation, unspecified: Secondary | ICD-10-CM

## 2020-04-18 DIAGNOSIS — Z79899 Other long term (current) drug therapy: Secondary | ICD-10-CM | POA: Diagnosis not present

## 2020-04-18 DIAGNOSIS — R29810 Facial weakness: Secondary | ICD-10-CM | POA: Diagnosis present

## 2020-04-18 DIAGNOSIS — R471 Dysarthria and anarthria: Secondary | ICD-10-CM | POA: Diagnosis present

## 2020-04-18 DIAGNOSIS — I5032 Chronic diastolic (congestive) heart failure: Secondary | ICD-10-CM | POA: Diagnosis present

## 2020-04-18 DIAGNOSIS — Z7189 Other specified counseling: Secondary | ICD-10-CM | POA: Diagnosis not present

## 2020-04-18 DIAGNOSIS — Z515 Encounter for palliative care: Secondary | ICD-10-CM

## 2020-04-18 DIAGNOSIS — R918 Other nonspecific abnormal finding of lung field: Secondary | ICD-10-CM | POA: Diagnosis present

## 2020-04-18 DIAGNOSIS — Z20822 Contact with and (suspected) exposure to covid-19: Secondary | ICD-10-CM | POA: Diagnosis present

## 2020-04-18 DIAGNOSIS — I610 Nontraumatic intracerebral hemorrhage in hemisphere, subcortical: Secondary | ICD-10-CM | POA: Diagnosis not present

## 2020-04-18 DIAGNOSIS — Z66 Do not resuscitate: Secondary | ICD-10-CM | POA: Diagnosis not present

## 2020-04-18 DIAGNOSIS — I110 Hypertensive heart disease with heart failure: Secondary | ICD-10-CM | POA: Diagnosis present

## 2020-04-18 DIAGNOSIS — E0520 Thyrotoxicosis with toxic multinodular goiter without thyrotoxic crisis or storm: Secondary | ICD-10-CM | POA: Diagnosis present

## 2020-04-18 DIAGNOSIS — J9601 Acute respiratory failure with hypoxia: Secondary | ICD-10-CM | POA: Diagnosis present

## 2020-04-18 DIAGNOSIS — G8191 Hemiplegia, unspecified affecting right dominant side: Secondary | ICD-10-CM | POA: Diagnosis present

## 2020-04-18 DIAGNOSIS — Z7901 Long term (current) use of anticoagulants: Secondary | ICD-10-CM | POA: Diagnosis not present

## 2020-04-18 DIAGNOSIS — G9341 Metabolic encephalopathy: Secondary | ICD-10-CM | POA: Diagnosis present

## 2020-04-18 DIAGNOSIS — I629 Nontraumatic intracranial hemorrhage, unspecified: Secondary | ICD-10-CM | POA: Diagnosis not present

## 2020-04-18 DIAGNOSIS — I4891 Unspecified atrial fibrillation: Secondary | ICD-10-CM | POA: Diagnosis present

## 2020-04-18 DIAGNOSIS — Z9911 Dependence on respirator [ventilator] status: Secondary | ICD-10-CM | POA: Diagnosis not present

## 2020-04-18 DIAGNOSIS — G935 Compression of brain: Secondary | ICD-10-CM | POA: Diagnosis present

## 2020-04-18 DIAGNOSIS — R4020 Unspecified coma: Secondary | ICD-10-CM | POA: Diagnosis present

## 2020-04-18 DIAGNOSIS — K4090 Unilateral inguinal hernia, without obstruction or gangrene, not specified as recurrent: Secondary | ICD-10-CM | POA: Diagnosis present

## 2020-04-18 DIAGNOSIS — I161 Hypertensive emergency: Secondary | ICD-10-CM

## 2020-04-18 DIAGNOSIS — G936 Cerebral edema: Secondary | ICD-10-CM | POA: Diagnosis present

## 2020-04-18 LAB — BASIC METABOLIC PANEL
Anion gap: 11 (ref 5–15)
BUN: 12 mg/dL (ref 8–23)
CO2: 23 mmol/L (ref 22–32)
Calcium: 8.6 mg/dL — ABNORMAL LOW (ref 8.9–10.3)
Chloride: 105 mmol/L (ref 98–111)
Creatinine, Ser: 0.73 mg/dL (ref 0.44–1.00)
GFR, Estimated: >60 mL/min (ref >60)
Glucose, Bld: 124 mg/dL — ABNORMAL HIGH (ref 70–99)
Potassium: 3.8 mmol/L (ref 3.5–5.1)
Sodium: 139 mmol/L (ref 135–145)

## 2020-04-18 LAB — POCT I-STAT 7, (LYTES, BLD GAS, ICA,H+H)
Acid-Base Excess: 3 mmol/L — ABNORMAL HIGH (ref 0.0–2.0)
Bicarbonate: 27 mmol/L (ref 20.0–28.0)
Calcium, Ion: 1.24 mmol/L (ref 1.15–1.40)
HCT: 40 % (ref 36.0–46.0)
Hemoglobin: 13.6 g/dL (ref 12.0–15.0)
O2 Saturation: 99 %
Patient temperature: 98
Potassium: 3.3 mmol/L — ABNORMAL LOW (ref 3.5–5.1)
Sodium: 141 mmol/L (ref 135–145)
TCO2: 28 mmol/L (ref 22–32)
pCO2 arterial: 35.9 mmHg (ref 32.0–48.0)
pH, Arterial: 7.482 — ABNORMAL HIGH (ref 7.350–7.450)
pO2, Arterial: 109 mmHg — ABNORMAL HIGH (ref 83.0–108.0)

## 2020-04-18 LAB — GLUCOSE, CAPILLARY
Glucose-Capillary: 100 mg/dL — ABNORMAL HIGH (ref 70–99)
Glucose-Capillary: 102 mg/dL — ABNORMAL HIGH (ref 70–99)
Glucose-Capillary: 107 mg/dL — ABNORMAL HIGH (ref 70–99)
Glucose-Capillary: 125 mg/dL — ABNORMAL HIGH (ref 70–99)
Glucose-Capillary: 75 mg/dL (ref 70–99)
Glucose-Capillary: 95 mg/dL (ref 70–99)

## 2020-04-18 LAB — CBC
HCT: 38.5 % (ref 36.0–46.0)
Hemoglobin: 12.4 g/dL (ref 12.0–15.0)
MCH: 28.9 pg (ref 26.0–34.0)
MCHC: 32.2 g/dL (ref 30.0–36.0)
MCV: 89.7 fL (ref 80.0–100.0)
Platelets: 172 10*3/uL (ref 150–400)
RBC: 4.29 MIL/uL (ref 3.87–5.11)
RDW: 15.4 % (ref 11.5–15.5)
WBC: 9.3 10*3/uL (ref 4.0–10.5)
nRBC: 0 % (ref 0.0–0.2)

## 2020-04-18 LAB — URINALYSIS, ROUTINE W REFLEX MICROSCOPIC
Bilirubin Urine: NEGATIVE
Glucose, UA: NEGATIVE mg/dL
Hgb urine dipstick: NEGATIVE
Ketones, ur: 5 mg/dL — AB
Leukocytes,Ua: NEGATIVE
Nitrite: NEGATIVE
Protein, ur: 30 mg/dL — AB
Specific Gravity, Urine: 1.04 — ABNORMAL HIGH (ref 1.005–1.030)
pH: 6 (ref 5.0–8.0)

## 2020-04-18 LAB — I-STAT ARTERIAL BLOOD GAS, ED
Acid-Base Excess: 5 mmol/L — ABNORMAL HIGH (ref 0.0–2.0)
Bicarbonate: 29.2 mmol/L — ABNORMAL HIGH (ref 20.0–28.0)
Calcium, Ion: 1.22 mmol/L (ref 1.15–1.40)
HCT: 38 % (ref 36.0–46.0)
Hemoglobin: 12.9 g/dL (ref 12.0–15.0)
O2 Saturation: 100 %
Patient temperature: 100.3
Potassium: 3.4 mmol/L — ABNORMAL LOW (ref 3.5–5.1)
Sodium: 139 mmol/L (ref 135–145)
TCO2: 30 mmol/L (ref 22–32)
pCO2 arterial: 40.5 mmHg (ref 32.0–48.0)
pH, Arterial: 7.47 — ABNORMAL HIGH (ref 7.350–7.450)
pO2, Arterial: 371 mmHg — ABNORMAL HIGH (ref 83.0–108.0)

## 2020-04-18 LAB — LIPID PANEL
Cholesterol: 113 mg/dL (ref 0–200)
HDL: 64 mg/dL (ref >40)
LDL Cholesterol: 43 mg/dL (ref 0–99)
Total CHOL/HDL Ratio: 1.8 RATIO
Triglycerides: 30 mg/dL (ref <150)
VLDL: 6 mg/dL (ref 0–40)

## 2020-04-18 LAB — HEMOGLOBIN A1C
Hgb A1c MFr Bld: 6 % — ABNORMAL HIGH (ref 4.8–5.6)
Mean Plasma Glucose: 125.5 mg/dL

## 2020-04-18 LAB — SODIUM: Sodium: 144 mmol/L (ref 135–145)

## 2020-04-18 LAB — ABO/RH: ABO/RH(D): O POS

## 2020-04-18 LAB — MRSA PCR SCREENING: MRSA by PCR: NEGATIVE

## 2020-04-18 LAB — HEPARIN LEVEL (UNFRACTIONATED): Heparin Unfractionated: 0.61 IU/mL (ref 0.30–0.70)

## 2020-04-18 LAB — TYPE AND SCREEN
ABO/RH(D): O POS
Antibody Screen: NEGATIVE

## 2020-04-18 LAB — APTT: aPTT: 28 seconds (ref 24–36)

## 2020-04-18 LAB — MAGNESIUM: Magnesium: 1.7 mg/dL (ref 1.7–2.4)

## 2020-04-18 LAB — PHOSPHORUS: Phosphorus: 2.9 mg/dL (ref 2.5–4.6)

## 2020-04-18 MED ORDER — IOHEXOL 300 MG/ML  SOLN
100.0000 mL | Freq: Once | INTRAMUSCULAR | Status: AC | PRN
  Administered 2020-04-18: 100 mL via INTRAVENOUS

## 2020-04-18 MED ORDER — DOCUSATE SODIUM 100 MG PO CAPS: 100.0000 mg | ORAL_CAPSULE | Freq: Two times a day (BID) | ORAL | Status: DC | PRN

## 2020-04-18 MED ORDER — CHLORHEXIDINE GLUCONATE CLOTH 2 % EX PADS
6.0000 | MEDICATED_PAD | Freq: Every day | CUTANEOUS | Status: DC
  Administered 2020-04-18 – 2020-04-22 (×4): 6 via TOPICAL

## 2020-04-18 MED ORDER — ORAL CARE MOUTH RINSE
15.0000 mL | OROMUCOSAL | Status: DC
  Administered 2020-04-18 – 2020-04-22 (×40): 15 mL via OROMUCOSAL

## 2020-04-18 MED ORDER — METHIMAZOLE 5 MG PO TABS
5.0000 mg | ORAL_TABLET | Freq: Every day | ORAL | Status: DC
  Administered 2020-04-18 – 2020-04-21 (×4): 5 mg via ORAL
  Filled 2020-04-18 (×4): qty 1

## 2020-04-18 MED ORDER — FENTANYL CITRATE (PF) 100 MCG/2ML IJ SOLN
25.0000 ug | Freq: Once | INTRAMUSCULAR | Status: AC
Start: 2020-04-18 — End: 2020-04-18
  Administered 2020-04-18: 25 ug via INTRAVENOUS
  Filled 2020-04-18: qty 2

## 2020-04-18 MED ORDER — INSULIN ASPART 100 UNIT/ML ~~LOC~~ SOLN
0.0000 [IU] | SUBCUTANEOUS | Status: DC
  Administered 2020-04-18 – 2020-04-20 (×4): 2 [IU] via SUBCUTANEOUS

## 2020-04-18 MED ORDER — SODIUM CHLORIDE 3 % IV SOLN
INTRAVENOUS | Status: DC
  Filled 2020-04-18 (×4): qty 500

## 2020-04-18 MED ORDER — PANTOPRAZOLE SODIUM 40 MG PO TBEC: 40.0000 mg | DELAYED_RELEASE_TABLET | Freq: Every day | ORAL | Status: DC

## 2020-04-18 MED ORDER — FENTANYL 2500MCG IN NS 250ML (10MCG/ML) PREMIX INFUSION
25.0000 ug/h | INTRAVENOUS | Status: DC
  Administered 2020-04-18: 25 ug/h via INTRAVENOUS
  Filled 2020-04-18: qty 250

## 2020-04-18 MED ORDER — METOPROLOL TARTRATE 5 MG/5ML IV SOLN
5.0000 mg | INTRAVENOUS | Status: DC | PRN
  Administered 2020-04-18: 5 mg via INTRAVENOUS
  Filled 2020-04-18: qty 5

## 2020-04-18 MED ORDER — FENTANYL BOLUS VIA INFUSION
25.0000 ug | INTRAVENOUS | Status: DC | PRN
  Filled 2020-04-18: qty 25

## 2020-04-18 MED ORDER — CHLORHEXIDINE GLUCONATE 0.12% ORAL RINSE (MEDLINE KIT)
15.0000 mL | Freq: Two times a day (BID) | OROMUCOSAL | Status: DC
  Administered 2020-04-18 – 2020-04-22 (×8): 15 mL via OROMUCOSAL

## 2020-04-18 MED ORDER — POLYETHYLENE GLYCOL 3350 17 G PO PACK: 17.0000 g | PACK | Freq: Every day | ORAL | Status: DC | PRN

## 2020-04-18 MED ORDER — PANTOPRAZOLE SODIUM 40 MG PO PACK
40.0000 mg | PACK | Freq: Every day | ORAL | Status: DC
  Administered 2020-04-18 – 2020-04-21 (×4): 40 mg
  Filled 2020-04-18 (×4): qty 20

## 2020-04-18 MED ORDER — PROTHROMBIN COMPLEX CONC HUMAN 500 UNITS IV KIT
3135.0000 [IU] | PACK | Status: AC
  Administered 2020-04-18: 3135 [IU] via INTRAVENOUS
  Filled 2020-04-18: qty 3135

## 2020-04-18 NOTE — Progress Notes (Signed)
[facility] referral made. Referral # [id number]. Not a candidate for donation. Bedside staff are aware.

## 2020-04-18 NOTE — H&P (Addendum)
Neurology H&P Reason for Consult: Altered mental status, found to have an intracerebral hemorrhage Requesting Physician:   CC: Altered mental status  History is obtained from: ED providers and chart review  HPI: Mary Chaney is a 87 y.o. 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  She lives independently and was found unresponsive by family. On ED arrival she was noted to be obtunded but did withdraw the left upper and lower extremities to pain, with a GCS score of 6 (eyes 1, verbal 1, motor 4) She was emergently intubated for airway protection and neurology was consulted when head CT revealed a large intracerebral hemorrhage. CT c/a/p with c/f bowel strangulation at upper abdominal hernia and c/f colitis  Attempted but was unable to personally reach family, history per chart review   LKW: 7 PM on 2/20  tPA given?: No, due to ICH Premorbid modified rankin scale:     0 - No symptoms.  ICH Score: 4  Time performed: 2:30 AM GCS: 5-12 is 1 point Infratentorial: No. Volume: >30cc is 1 point  Age: 87 y.o.. >80 is 1 point Intraventricular extension is 1 point  A Score of 4 points has a 30 day mortality of 97%. Stroke. 2001 Apr;32(4):891-7.    ROS: All other review of systems was negative except as noted in the HPI. Unable to obtain due to altered mental status.   Past Medical History:  Diagnosis Date  . Atrial fibrillation (HCC)   . Hemorrhoids   . Hypertension   . Toxic multinodular goiter     Family History  Problem Relation Age of Onset  . Other Mother   . Other Father    Current Outpatient Medications  Medication Instructions  . digoxin (LANOXIN) 0.125 mg, Oral, Every M-W-F  . methimazole (TAPAZOLE) 5 mg, Oral, Daily  . metoprolol succinate (TOPROL-XL) 50 mg, Oral, Daily, Take with or immediately following a meal.  . nutrition supplement, JUVEN, (JUVEN) PACK 1  packet, Oral, Daily  . Rivaroxaban (XARELTO) 15 MG TABS tablet TAKE 1 TABLET (15 MG TOTAL) BY MOUTH DAILY WITH SUPPER.  . valsartan-hydrochlorothiazide (DIOVAN-HCT) 160-25 MG tablet 1 tablet, Oral, Daily     Social History:  reports that she quit smoking about 37 years ago. She smoked 1.00 pack per day. She has never used smokeless tobacco. She reports that she does not drink alcohol and does not use drugs.  Exam: Current vital signs: BP 123/67   Pulse 94   Temp 100.3 F (37.9 C) (Rectal)   Resp (!) 23   Ht 4\' 10"  (1.473 m)   Wt 59.5 kg   SpO2 99%   BMI 27.42 kg/m  Vital signs in last 24 hours: Temp:  [100.3 F (37.9 C)] 100.3 F (37.9 C) (02/21 2218) Pulse Rate:  [26-106] 94 (02/22 0049) Resp:  [21-28] 23 (02/22 0049) BP: (110-155)/(67-118) 123/67 (02/22 0030) SpO2:  [93 %-100 %] 99 % (02/22 0049) FiO2 (%):  [40 %-100 %] 40 % (02/22 0049) Weight:  [59.5 kg] 59.5 kg (02/21 2248)   Physical Exam  Constitutional: Appears well-developed and well-nourished.  Psych: Minimally interactive Eyes: No scleral injection HENT: No oropharyngeal obstruction.  MSK: no joint deformities.  Cardiovascular: Normal rate and regular rhythm.  Respiratory: Effort normal, non-labored breathing, overall comfortable on the vent though she did cough occasionally GI: Soft.  No distension. There is no tenderness.  Skin: Chronic skin changes of the bilateral lower extremities  Neuro: Mental Status: Patient is obtunded and does not follow any commands. Cranial Nerves: II: No blink to threat.  Pupils 5 mm and dilated bilaterally.   III,IV, VI: Negative VOR V: Facial sensation is symmetric to eyelash brush VII: Facial movement is notable for a profound right facial droop evident even despite ET tube in place VIII: hearing is intact to voice X: Uvula elevates symmetrically XI: Shoulder shrug is symmetric. XII: tongue is midline without atrophy or fasciculations.  Sensory/motor: Tone is normal.  Bulk is normal.  Localizes to pain with the left upper extremity, withdraws the left lower extremity.  Extensor posturing of the right upper extremity and trace movement of the right lower extremity that is not enough to characterize Deep Tendon Reflexes: 3+ and symmetric in the biceps Plantars: Toes are mute bilaterally.   NIHSS total 30 Score breakdown:  2 points for level of consciousness, 2 points for not answering questions correctly, 2 points for fixed gaze, 3 points for blindness, 2 points for right-sided facial droop, 3 points for each of her 4 limbs for weakness, 3 points for mute language, 2 points for dysarthria (on arrival per EDP)  I have reviewed labs in epic and the results pertinent to this consultation are: Creatinine 0.69  I have reviewed the images obtained: Head CT with large left parenchymal hemorrhage with some intraventricular extension best seen in the right occipital horn   Impression/Recommendations: # Hemorrhagic stroke, likely hypertensive, possibly underlying mass  or CAA, less likely hemorrhagic conversion of ischemic stroke  - s/p Kcentra for reversal  - Stroke labs HgbA1c, fasting lipid panel - MRI brain w/ and w/o when stabilized to eval for underlying mass, with MRA if CTA not completed (MRA not yet ordered) - Hold on CTA head and neck for vessel imaging due to recent contrast load for CT w/ chest/ab/pelvis; dry head for stability scan at 6 hours from arrival - Frequent neuro checks, q1hr  - Echocardiogram - No antiplatelets due to ICH - DVT PPx heparin at 24 hrs if stable, SCDs for now - Risk factor modification - Telemetry monitoring; 30 day event monitor on discharge if no arrythmias captured  - Blood pressure goal SBP < 140  - PT consult, OT consult, Speech consult when patient stabilized  - Stroke team to follow - Appreciate CCM management of ventilator and other medical comorbidities including bowel issues - Appreciate neurosurgery evaluation,  not felt to be surgical candidate per neurosurgery - Appreciate pharmacy management of hypertonic saline for goal sodium 150-155 - Ongoing goals of care discussions with family  Brooke Dare MD-PhD [facility] [phone]  Total critical care time: 40 minutes   Critical care time was exclusive of separately billable procedures and treating other patients.   Critical care was necessary to treat or prevent imminent or life-threatening deterioration.   Critical care was time spent personally by me on the following activities: development of treatment plan with patient and/or surrogate as well as nursing, discussions with consultants/primary team, evaluation of patient's response to treatment, examination of patient, obtaining history from patient or surrogate, ordering and performing treatments and interventions, ordering and review of laboratory studies, ordering and review of radiographic studies, and re-evaluation of patient's condition as needed, as documented above.

## 2020-04-18 NOTE — Progress Notes (Signed)
Seen and examined. Triggering vent Some purposeful movement of LUE other ext not moving Pupils fixed, unresponsive  A: Terminal intracerebral hemorrhage  P: Awaiting family arrival, further aggressive measures in this setting are futile.  Myrla Halsted MD PCCM

## 2020-04-18 NOTE — Progress Notes (Signed)
eLink Physician-Brief Progress Note Patient Name: Mary Chaney DOB: August 28, 1927 MRN: [id number]   Date of Service  04/18/2020  HPI/Events of Note  Massive ICH - Not felt to be survivable. Nursing request for Foley catheter.   eICU Interventions  Will place Foley catheter.      Intervention Category Major Interventions: Other:  Lenell Antu 04/18/2020, 3:17 AM

## 2020-04-18 NOTE — Plan of Care (Signed)
  Problem: Clinical Measurements: Goal: Will remain free from infection Outcome: Progressing Goal: Cardiovascular complication will be avoided Outcome: Progressing   Problem: Activity: Goal: Risk for activity intolerance will decrease Outcome: Progressing   Problem: Coping: Goal: Level of anxiety will decrease Outcome: Progressing   Problem: Elimination: Goal: Will not experience complications related to bowel motility Outcome: Progressing   Problem: Pain Managment: Goal: General experience of comfort will improve Outcome: Progressing   Problem: Safety: Goal: Ability to remain free from injury will improve Outcome: Progressing   Problem: Skin Integrity: Goal: Risk for impaired skin integrity will decrease Outcome: Progressing   Problem: Education: Goal: Knowledge of General Education information will improve Description: Including pain rating scale, medication(s)/side effects and non-pharmacologic comfort measures Outcome: Not Progressing   Problem: Health Behavior/Discharge Planning: Goal: Ability to manage health-related needs will improve Outcome: Not Progressing   Problem: Clinical Measurements: Goal: Ability to maintain clinical measurements within normal limits will improve Outcome: Not Progressing Goal: Diagnostic test results will improve Outcome: Not Progressing Goal: Respiratory complications will improve Outcome: Not Progressing   Problem: Nutrition: Goal: Adequate nutrition will be maintained Outcome: Not Progressing   Problem: Elimination: Goal: Will not experience complications related to urinary retention Outcome: Not Progressing

## 2020-04-18 NOTE — Progress Notes (Signed)
Patient transported to 2H19 without complications.

## 2020-04-18 NOTE — Progress Notes (Addendum)
Called in regards to this patient's head CT which shows a massive left sided intraparenchymal bleed with mass effect and midline shift. This is an insurvivable injury in a 87 year old. Patient is not a surgical candidate given her age and history of xarelto use. Neurology should be consulted. A family discussion really needs to be held for goals of care before more aggressive interventions are taken, given she was last seen normal last night and unresponsive tonight. Would highly recommend comfort care measures for this patient.

## 2020-04-18 NOTE — Progress Notes (Signed)
This chaplain responded to the consult for spiritual after communicating with Palliative Care.  The Pt. daughter-Karen is at the bedside waiting for her sister to arrive. Clydie Braun states many times, "I am just feeling numb."  The chaplain offered a pastoral presence and companionship to Clydie Braun in her storytelling.  The chaplain understands the Pt. 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   The chaplain was informed by the PMT and the RN-Ashlyn of the upcoming family meeting.  The chaplain offered spiritual care as needed and accepted Karen's request for intercessory prayer.

## 2020-04-18 NOTE — Progress Notes (Signed)
   PCCM Interval Note  Fentanyl at 75 mcg/hr- to keep from chewing/ gagging on ETT and spike in HR when stimulated Remains on 3% at 50 ml/hr, pending BMET   Blood pressure (!) 143/88, pulse 77, temperature 98.8 F (37.1 C), resp. rate 20, height 4\' 10"  (1.473 m), weight 57.4 kg, SpO2 95 %.  Vent Mode: PRVC FiO2 (%):  [40 %-100 %] 40 % Set Rate:  [16 bmp-20 bmp] 20 bmp Vt Set:  [320 mL-400 mL] 320 mL PEEP:  [5 cmH20] 5 cmH20 Plateau Pressure:  [15 cmH20-35 cmH20] 18 cmH20   General:  Critically ill elderly female in NAD on MV HEENT: MM pink/moist, ETT/ OGT, pupils fixed/ dilated, +left corneal, absent right Neuro:  Unresponsive, ?localizes with LUE, ?triple reflex LLE, flaccid on right CV: irir, no murmur PULM:  Non labored on MV, breathing spont over vent, CTA, scant old dark secretions, +cough GI: soft, +bs, foley, appears to be two soft hernias- mid epigastric and left groin/ suprapubic Extremities: warm/dry, no edema Skin: no rashes    Massive Left ICH with mass effect and midline shift in hypertensive patient on Xarelto s/p reversal with kcentra Acute respiratory insufficiency secondary to above   - not a surgical candidate and felt to be a non survivable injury per NSGY note - remains on hypertonic saline goal Na 150-155 - spoke with Dr. with Neurology, will hold off on repeat [facility] for now as this will not change her prognosis.   - urgent PMT consult placed by Dr. [facility], appreciate input - remains a full code at this time, other daughter due to arrive from [place] today around 1600.  CPR would be futile given non-survivable ICH - ongoing hypertonic saline for now, per Neurology  - continue supportive care for now, including full MV support - currently within BP parameters, prn lopressor    Add CCT 25 mins    Katrinka Blazing, ACNP [facility] 04/18/2020, 10:04 AM

## 2020-04-18 NOTE — Consult Note (Signed)
Consultation Note Date: 04/18/2020   Patient Name: Mary Chaney  DOB: Feb 18, 1928  MRN: [id number]  Age / Sex: 87 y.o., female  PCP: Patient, No Pcp Per Referring Physician: Candee Furbish, MD  Reason for Consultation: Establishing goals of care  HPI/Patient Profile: 87 y.o. female  with past medical history of atrial fibrillation (on Xarelto), hypertension, chronic diastolic heart failure, and toxic multinodular goiter admitted on 04/01/2020 after being found unresponsive by family with last known normal on the evening of 04/16/2020. She was intubated for airway protection and head CT demonstrated large ICH with significant mass effect of 32m.   Palliative care has been consulted to discuss goals of care with family as the patient is not a surgical candidate and this is felt to be non-survivable injury.  Clinical Assessment and Goals of Care: Medical records reviewed. Discussed with MWadie LessenNP, RN/Jessica, Dr. XErlinda Hong Dr. STamala Julian and patient's daughter KSantiago Glad Assessed patient at the bedside. She is intubated and unresponsive.  Palliative care was introduced as a medical subspeciality that focuses on quality of life. It provides an extra layer of support to patients with serious illness as well as their families as they navigate treatment options, decision making, and advance care planning.   Ms. BFerrandodaughter KSantiago Gladshares that her mother was living independently prior to hospitalization. KSantiago Gladlives 20 minutes away and regularly checks on her; the [place] Ms. BHarganwas functioning well and enjoyed tidying up around her home and staying busy.   Created space and opportunity for thoughts and questions regarding the information that KSantiago Gladhas been receiving from multiple care teams. She states that she understands everything that has been stated and is "just feeling numb."  She is awaiting the arrival of her sister at 4pm this afternoon to speak with neurology again in her presence. KSantiago Gladhas had limited conversations with her mother about code status or goals of care. She believes that her mother has a living will at home but she is uncertain. She shares that her mother does not like being in hospitals and would never want to live in a nursing home. She then states "my mother also liked to live and she is a fNurse, adult"   Encouraged KSantiago Gladto find advance care planning documentation and reflect with family on her mother's preferences and values given the unfortunately dismal prognosis. The difference between comfort focused care and aggressive treatment was introduced.   Provided daughter with PMT contact information. Encouraged to call with any questions or concerns.   NEXT OF KIN are the patient's daughters.    SUMMARY OF RECOMMENDATIONS    Confirm with Dr. XErlinda Hongthat family expects a discussion with neuro at 4pm today  Full code/full scope at least until other daughter arrives this afternoon  Daughter reports ACP documentation may have been completed. Encouraged to find, review, and bring a copy for patient's medical record.  Appreciate chaplain support  Ongoing discussions with family on goals of care and recommendations for transition to comfort  focused care  Continue to provide holistic support    Additional Recommendations (Limitations, Scope, Preferences):  Full Scope Treatment for now, pending conversation with neurology this afternoon  Psycho-social/Spiritual:   Desire for further Chaplaincy support:yes  Prognosis:   Dismal prognosis given that she is not a surgical candidate s/p large ICH with mass shift  Discharge Planning: Anticipated Hospital Death      Primary Diagnoses: Present on Admission: **None**   I have reviewed the medical record, interviewed the patient and family, and examined the patient. The following aspects are  pertinent.  Past Medical History:  Diagnosis Date   Atrial fibrillation ([place])    Hemorrhoids    Hypertension    Toxic multinodular goiter    Social History   Socioeconomic History   Marital status: Widowed    Spouse name: Not on file   Number of children: Not on file   Years of education: Not on file   Highest education level: Not on file  Occupational History   Not on file  Tobacco Use   Smoking status: Former Smoker    Packs/day: 1.00    Quit date: 02/25/1983    Years since quitting: 37.1   Smokeless tobacco: Never Used  Substance and Sexual Activity   Alcohol use: No    Alcohol/week: 0.0 standard drinks   Drug use: No   Sexual activity: Not on file  Other Topics Concern   Not on file  Social History Narrative   Not on file   Social Determinants of Health   Financial Resource Strain: Not on file  Food Insecurity: Not on file  Transportation Needs: Not on file  Physical Activity: Not on file  Stress: Not on file  Social Connections: Not on file   Family History  Problem Relation Age of Onset   Other Mother    Other Father    Scheduled Meds:  chlorhexidine gluconate (MEDLINE KIT)  15 mL Mouth Rinse BID   Chlorhexidine Gluconate Cloth  6 each Topical Daily   insulin aspart  0-15 Units Subcutaneous Q4H   mouth rinse  15 mL Mouth Rinse 10 times per day   methimazole  5 mg Oral Daily   pantoprazole sodium  40 mg Per Tube Daily   Continuous Infusions:  fentaNYL infusion INTRAVENOUS 50 mcg/hr (04/18/20 1200)   sodium chloride (hypertonic) 50 mL/hr at 04/18/20 1200   PRN Meds:.docusate sodium, fentaNYL, metoprolol tartrate, polyethylene glycol Medications Prior to Admission:  Prior to Admission medications   Medication Sig Start Date End Date Taking? Authorizing Provider  digoxin (LANOXIN) 0.125 MG tablet Take 1 tablet (0.125 mg total) by mouth every Monday, Wednesday, and Friday. 12/24/19  Yes Belva Crome, MD  methimazole  (TAPAZOLE) 5 MG tablet Take 5 mg by mouth daily. 04/06/20  Yes [provider]  metoprolol succinate (TOPROL-XL) 50 MG 24 hr tablet Take 1 tablet (50 mg total) by mouth daily. Take with or immediately following a meal. 12/24/19  Yes Belva Crome, MD  nutrition supplement, JUVEN, (JUVEN) PACK Take 1 packet by mouth daily at 2 PM.   Yes [provider]  Rivaroxaban (XARELTO) 15 MG TABS tablet TAKE 1 TABLET (15 MG TOTAL) BY MOUTH DAILY WITH SUPPER. Patient taking differently: Take 15 mg by mouth daily with supper. 12/24/19  Yes Belva Crome, MD  valsartan-hydrochlorothiazide (DIOVAN-HCT) 160-25 MG tablet Take 1 tablet by mouth daily. 12/24/19  Yes Belva Crome, MD   Allergies  Allergen Reactions   Bee Venom  Swelling   Apixaban     Other reaction(s): headache      Vital Signs: BP 129/61    Pulse 99    Temp (!) 100.7 F (38.2 C)    Resp 20    Ht 4' 10"  (1.473 m)    Wt 57.4 kg    SpO2 100%    BMI 26.45 kg/m  Pain Scale: CPOT     SpO2: SpO2: 100 % O2 Device:SpO2: 100 % O2 Flow Rate: .O2 Flow Rate (L/min): 15 L/min  IO: Intake/output summary:   Intake/Output Summary (Last 24 hours) at 04/18/2020 1343 Last data filed at 04/18/2020 1200 Gross per 24 hour  Intake 461.53 ml  Output 355 ml  Net 106.53 ml    LBM:   Baseline Weight: Weight: 59.5 kg Most recent weight: Weight: 57.4 kg      Palliative Assessment/Data: PPS 10%    Time Total: 60 minutes  Greater than 50% of this time was spent counseling and coordinating care related to the above assessment and plan.  Signed by: Norberta Keens, PA-C   Please contact Palliative Medicine Team phone at [phone] for questions and concerns.  For individual provider: See Shea Evans

## 2020-04-18 NOTE — H&P (Signed)
NAME:  Mary Chaney, MRN:  [id number], DOB:  1927/08/15, LOS: 0 ADMISSION DATE:  03/28/2020, CONSULTATION DATE:  04/18/20 REFERRING MD:  EDP, CHIEF COMPLAINT:  unresponsive   Brief History:  87 y.o. F with PMH of Atrial Fibrillation on Xarelto, HTN, multi-nodular goiter who was found unresponsive by family and has large fronto-parietal ICH with mass effect and global effacement.  Intubated and PCCM consulted for admission.  History of Present Illness:  Mary Chaney is a 87 y.o. F with PMH of Atrial Fibrillation on Xarelto, HTN, multi-nodular goiter who was found unresponsive by family, last known well around 7pm.  Pt was breathing, but unresponsive on EMS arrival and was intubated in the ED.  ED work-up significant for [facility] showing Intracerebral hemorrhage within the left frontoparietal lobes with extensive surrounding hypoattenuating edema and significant mass effect with global sulcal effacement. Labs grossly unremarkable, INR 1.3.   CXR with possible developing LLL infiltrate.  Per neurosurgery, pt is not a surgical candidate.  She was given Kcentra and PCCM consulted for admission  Past Medical History:   has a past medical history of Atrial fibrillation (HCC), Hemorrhoids, Hypertension, and Toxic multinodular goiter.  Significant Hospital Events:  2/22 Admit to PCCM  Consults:  Neurosurgery Neurology  Procedures:  2/22 ETT  Significant Diagnostic Tests:  2/22 CT head/abdomen/pelvis>>Intracerebral hemorrhage within the left frontoparietal lobes measuring up to 8 x 5.6 x 5.8 cm in size. Extensive surrounding hypoattenuating edema. Significant mass effect with global sulcal effacement of the left cerebral hemisphere, 18 mm of left to right midline shift, medialization of the left uncus and indentation of the contralateral right cerebral crus against the right tentorium cerebelli.  Micro Data:  2/22 BCx2>>  Antimicrobials:    Interim History / Subjective:  As  above  Objective   Blood pressure 123/67, pulse 94, temperature 100.3 F (37.9 C), temperature source Rectal, resp. rate (!) 23, height 4\' 10"  (1.473 m), weight 59.5 kg, SpO2 99 %.    Vent Mode: PRVC FiO2 (%):  [40 %-100 %] 40 % Set Rate:  [16 bmp-20 bmp] 20 bmp Vt Set:  [330 mL-400 mL] 330 mL PEEP:  [5 cmH20] 5 cmH20 Plateau Pressure:  [16 cmH20-35 cmH20] 16 cmH20  No intake or output data in the 24 hours ending 04/18/20 0056 Filed Weights   03/29/2020 2248  Weight: 59.5 kg   General:  Thin, elderly F intubated  HEENT: MM pink/moist, pupils 62mm and fixed bilaterally, ETT in place Neuro: withdraws in bilateral LE to pain, +corneal reflexes, +gag, is triggering breaths on vent CV: s1s2 rrr, no m/r/g PULM:  On full vent support with mechanical breath sounds bilaterally and no wheezing or rhonchi GI: soft, bsx4 active  Extremities: warm/dry, no edema  Skin: no rashes or lesions   Resolved Hospital Problem list     Assessment & Plan:   Large L fronto-parietal ICH with midline shift and global sulcal effacement Not a surgical candidate per NS given age and xarelto use, likely unsurvivable injury  P: -Discussion of prognosis with daughter including that CPR would not be effective if, and unfortunately likely when,  patient were to progress to cardiac arrest.  She would like to speak with other family members and continue full code for tonight -admit to critical care -palliative care consult -BP control -neurology consult pending -Fentanyl gtt --Maintain full vent support -titrate Vent setting to maintain SpO2 greater than or equal to 90%. -HOB elevated 30 degrees. -Plateau pressures less than 30 cm H20.  -  Follow chest x-ray, ABG prn.   -Bronchial hygiene and RT/bronchodilator protocol.    Atrial Fibrillation, HTN Rate controlled, last echo with preserved EF P: -hold home Digoxin and Metoprolol for now and resume as needed  Toxic Multi-nodular Goiter -continue home  Tapazole  Best practice (evaluated daily)  Diet: NPO Pain/Anxiety/Delirium protocol (if indicated): Fentanyl, propofol VAP protocol (if indicated): HOB 30 degrees, suction prn DVT prophylaxis: SCD's GI prophylaxis: protonix Glucose control: SSI Mobility: bed rest Disposition:ICU  Goals of Care:  Last date of multidisciplinary goals of care discussion: 2/22 Family and staff present: Daughter Clydie Braun Summary of discussion: full cude Follow up goals of care discussion due: 2/29 Code Status: full code  Labs   CBC: Recent Labs  Lab 04/19/2020 2239 04/18/20 0002  WBC 8.5  --   NEUTROABS 7.5  --   HGB 14.1 12.9  HCT 45.4 38.0  MCV 91.0  --   PLT 202  --     Basic Metabolic Panel: Recent Labs  Lab 03/28/2020 2239 04/18/20 0002  NA 139 139  K 4.7 3.4*  CL 102  --   CO2 26  --   GLUCOSE 166*  --   BUN 12  --   CREATININE 0.69  --   CALCIUM 9.4  --    GFR: Estimated Creatinine Clearance: 34.9 mL/min (by C-G formula based on SCr of 0.69 mg/dL). Recent Labs  Lab 04/06/2020 2239  WBC 8.5    Liver Function Tests: Recent Labs  Lab 04/01/2020 2239  AST 39  ALT 21  ALKPHOS 107  BILITOT 0.6  PROT 7.8  ALBUMIN 3.5   Recent Labs  Lab 04/08/2020 2239  LIPASE 23   Recent Labs  Lab 03/31/2020 2240  AMMONIA 17    ABG    Component Value Date/Time   PHART 7.470 (H) 04/18/2020 0002   PCO2ART 40.5 04/18/2020 0002   PO2ART 371 (H) 04/18/2020 0002   HCO3 29.2 (H) 04/18/2020 0002   TCO2 30 04/18/2020 0002   O2SAT 100.0 04/18/2020 0002     Coagulation Profile: Recent Labs  Lab 04/16/2020 2239  INR 1.3*    Cardiac Enzymes: No results for input(s): CKTOTAL, CKMB, CKMBINDEX, TROPONINI in the last 168 hours.  HbA1C: Hgb A1c MFr Bld  Date/Time Value Ref Range Status  03/16/2008 06:35 PM  4.6 - 6.1 % Final   6.0 (NOTE)   The ADA recommends the following therapeutic goal for glycemic   control related to Hgb A1C measurement:   Goal of Therapy:   < 7.0% Hgb A1C    Reference: American Diabetes Association: Clinical Practice   Recommendations 2008, Diabetes Care,  2008, 31:(Suppl 1).    CBG: No results for input(s): GLUCAP in the last 168 hours.  Review of Systems:   Unable to obtain secondary to mental status  Past Medical History:  She,  has a past medical history of Atrial fibrillation (HCC), Hemorrhoids, Hypertension, and Toxic multinodular goiter.   Surgical History:  No past surgical history on file.   Social History:   reports that she quit smoking about 37 years ago. She smoked 1.00 pack per day. She has never used smokeless tobacco. She reports that she does not drink alcohol and does not use drugs.   Family History:  Her family history includes Other in her father and mother.   Allergies Allergies  Allergen Reactions  . Bee Venom Swelling  . Apixaban     Other reaction(s): headache     Home Medications  Prior to Admission medications   Medication Sig Start Date End Date Taking? Authorizing Provider  digoxin (LANOXIN) 0.125 MG tablet Take 1 tablet (0.125 mg total) by mouth every Monday, Wednesday, and Friday. 12/24/19  Yes Lyn Records, MD  methimazole (TAPAZOLE) 5 MG tablet Take 5 mg by mouth daily. 04/06/20  Yes [provider]  metoprolol succinate (TOPROL-XL) 50 MG 24 hr tablet Take 1 tablet (50 mg total) by mouth daily. Take with or immediately following a meal. 12/24/19  Yes Lyn Records, MD  nutrition supplement, JUVEN, (JUVEN) PACK Take 1 packet by mouth daily at 2 PM.   Yes [provider]  Rivaroxaban (XARELTO) 15 MG TABS tablet TAKE 1 TABLET (15 MG TOTAL) BY MOUTH DAILY WITH SUPPER. Patient taking differently: Take 15 mg by mouth daily with supper. 12/24/19  Yes Lyn Records, MD  valsartan-hydrochlorothiazide (DIOVAN-HCT) 160-25 MG tablet Take 1 tablet by mouth daily. 12/24/19  Yes Lyn Records, MD     Critical care time: 55 minutes     CRITICAL CARE Performed by: Darcella Gasman  Josten Warmuth   Total critical care time: 55 minutes  Critical care time was exclusive of separately billable procedures and treating other patients.  Critical care was necessary to treat or prevent imminent or life-threatening deterioration.  Critical care was time spent personally by me on the following activities: development of treatment plan with patient and/or surrogate as well as nursing, discussions with consultants, evaluation of patient's response to treatment, examination of patient, obtaining history from patient or surrogate, ordering and performing treatments and interventions, ordering and review of laboratory studies, ordering and review of radiographic studies, pulse oximetry and re-evaluation of patient's condition.  Darcella Gasman Samreet [place], PA-C [facility] See Amion for pager If no response to pager , please call [phone] until 7pm After 7:00 pm call Elink  [phone]

## 2020-04-18 NOTE — Progress Notes (Signed)
Met with daughters to discuss catastrophic intracranial bleed.  Dr. Erlinda Hong reviewed the imaging with them.  I explained the prognosis and terminal nature.  They are still processing and would like until AM to make any final decisions.  They agree that if her heart stops overnight we should allow her to pass in peace.  18 minutes spent with advance care planning.  Erskine Emery MD PCCM

## 2020-04-18 NOTE — Progress Notes (Signed)
STROKE TEAM PROGRESS NOTE   SUBJECTIVE (INTERVAL HISTORY) Her daughter is at the bedside. Pt unresponsive, intubated, able to move left upper extremity with pain stimulation.  However bilateral pupil blown and fixed.  Still has corneal and gag reflexes.  CT head showed extensive left hemisphere large ICH with midline shift, unsurvivable.  ICH score 4.  Currently waiting for another daughter and son-in-law coming from [place] for family meeting and GOC discussion.   OBJECTIVE Temp:  [98.4 F (36.9 C)-100.8 F (38.2 C)] 100.8 F (38.2 C) (02/22 1508) Pulse Rate:  [25-114] 43 (02/22 1900) Resp:  [7-28] 20 (02/22 1900) BP: (106-155)/(60-118) 106/73 (02/22 1900) SpO2:  [93 %-100 %] 100 % (02/22 1900) FiO2 (%):  [40 %-100 %] 40 % (02/22 1508) Weight:  [57.4 kg-59.5 kg] 57.4 kg (02/22 0353)  Recent Labs  Lab 04/18/20 0407 04/18/20 0733 04/18/20 1115 04/18/20 1527  GLUCAP 100* 75 102* 95   Recent Labs  Lab 04/07/2020 2239 04/18/20 0002 04/18/20 0340 04/18/20 0849 04/18/20 1315  NA 139 139 141 139 144  K 4.7 3.4* 3.3* 3.8  --   CL 102  --   --  105  --   CO2 26  --   --  23  --   GLUCOSE 166*  --   --  124*  --   BUN 12  --   --  12  --   CREATININE 0.69  --   --  0.73  --   CALCIUM 9.4  --   --  8.6*  --   MG  --   --   --  1.7  --   PHOS  --   --   --  2.9  --    Recent Labs  Lab 04/11/2020 2239  AST 39  ALT 21  ALKPHOS 107  BILITOT 0.6  PROT 7.8  ALBUMIN 3.5   Recent Labs  Lab 03/31/2020 2239 04/18/20 0002 04/18/20 0340 04/18/20 0747  WBC 8.5  --   --  9.3  NEUTROABS 7.5  --   --   --   HGB 14.1 12.9 13.6 12.4  HCT 45.4 38.0 40.0 38.5  MCV 91.0  --   --  89.7  PLT 202  --   --  172   No results for input(s): CKTOTAL, CKMB, CKMBINDEX, TROPONINI in the last 168 hours. Recent Labs    04/20/2020 2239  LABPROT 15.4*  INR 1.3*   Recent Labs    04/18/20 0100  COLORURINE YELLOW  LABSPEC 1.040*  PHURINE 6.0  GLUCOSEU NEGATIVE  HGBUR NEGATIVE  BILIRUBINUR NEGATIVE   KETONESUR 5*  PROTEINUR 30*  NITRITE NEGATIVE  LEUKOCYTESUR NEGATIVE       Component Value Date/Time   CHOL 113 04/18/2020 0849   TRIG 30 04/18/2020 0849   HDL 64 04/18/2020 0849   CHOLHDL 1.8 04/18/2020 0849   VLDL 6 04/18/2020 0849   LDLCALC 43 04/18/2020 0849   Lab Results  Component Value Date   HGBA1C 6.0 (H) 04/18/2020      Component Value Date/Time   LABOPIA NONE DETECTED 03/17/2008 0431   COCAINSCRNUR NONE DETECTED 03/17/2008 0431   LABBENZ NONE DETECTED 03/17/2008 0431   AMPHETMU NONE DETECTED 03/17/2008 0431   THCU NONE DETECTED 03/17/2008 0431   LABBARB  03/17/2008 0431    NONE DETECTED        DRUG SCREEN FOR MEDICAL PURPOSES ONLY.  IF CONFIRMATION IS NEEDED FOR ANY PURPOSE, NOTIFY LAB WITHIN 5 DAYS.  LOWEST DETECTABLE LIMITS FOR URINE DRUG SCREEN Drug Class       Cutoff (ng/mL) Amphetamine      1000 Barbiturate      200 Benzodiazepine   200 Tricyclics       300 Opiates          300 Cocaine          300 THC              50    No results for input(s): ETH in the last 168 hours.  I have personally reviewed the radiological images below and agree with the radiology interpretations.  CT Head Wo Contrast  Result Date: 04/18/2020 CLINICAL DATA:  Found down at 19:00 on 04/16/2020, posturing, history of heart disease EXAM: CT HEAD WITHOUT CONTRAST CT CERVICAL SPINE WITHOUT CONTRAST CT CHEST, ABDOMEN AND PELVIS WITH CONTRAST TECHNIQUE: Contiguous axial images were obtained from the base of the skull through the vertex without intravenous contrast. Multidetector CT imaging of the cervical spine was performed without intravenous contrast. Multiplanar CT image reconstructions were also generated. Multidetector CT imaging of the chest, abdomen and pelvis was performed following the standard protocol during bolus administration of intravenous contrast. CONTRAST:  OMNIPAQUE IOHEXOL 300 MG/ML  SOLN COMPARISON:  CT head and chest angiography 11/26/2015  FINDINGS: CT HEAD FINDINGS Brain: Massive intracerebral hemorrhage within the left frontoparietal lobes measuring up to 8 x 5.6 x 5.8 cm in size. Extensive surrounding hypoattenuating edema. Significant mass effect with global sulcal effacement of the left cerebral hemisphere, effacement of the left lateral ventricle, 18 mm of left to right midline shift, medial ization of the uncus and indentation of the contralateral cerebral crus against the right tentorium cerebelli. No other acute sites of intracranial hemorrhage or extra-axial collection. Findings on a background of diffuse parenchymal volume loss. Patchy areas of white matter hypoattenuation are most compatible with chronic microvascular angiopathy. Vascular: Atherosclerotic calcification of the carotid siphons and intradural vertebral arteries. No hyperdense vessel. Skull: Right temporal and left parieto-occipital scalp swelling and thickening. No subjacent calvarial fractures. Mild edematous changes of the scalp. Sinuses/Orbits: Diffuse thickening in the ethmoids with pneumatized secretions in the posterior nasopharynx and nasal passages. Remaining paranasal sinuses and mastoid air cells are predominantly clear. Middle ear cavities are clear. Orbital structures are unremarkable aside from prior lens extractions. Other: Edentulous.  Transesophageal and endotracheal tubes in place. CT CERVICAL FINDINGS Alignment: Slight reversal of normal cervical lordosis at the C2-3 level. Minimal anterolisthesis C2 on C3 favored to be on a degenerative basis. Similar likely degenerative anterolisthesis noted C7 on T1. No evidence of traumatic listhesis. No abnormally widened, perched or jumped facets. Normal alignment of the craniocervical and atlantoaxial articulations accounting for rightward cranial rotation and slight rightward lateral cervical flexion. Skull base and vertebrae: The osseous structures appear diffusely demineralized which may limit detection of small or  nondisplaced fractures. No acute skull base fracture. No vertebral body fracture or height loss. Soft tissues and spinal canal: No prevertebral swelling, fluid or gas. Mild edematous changes noted along the nuchal ligament and cervical musculature posteriorly. Endotracheal and transesophageal tubes are in place. Secretions noted in the posterior naso and oropharynx as imaged. Cervical carotid atherosclerosis. Disc levels: Multilevel intervertebral disc height loss with spondylitic endplate changes. Larger disc osteophyte complexes are present C2-3, C3-4 and C6-7 which partially efface the ventral thecal sac without significant resulting canal impingement. Multilevel uncinate spurring and facet hypertrophic changes result in mild-to-moderate foraminal narrowing most pronounced at the upper  cervical levels. Other: Massively enlarged, heterogeneous multinodular thyroid gland extending inferiorly into the thoracic inlet within overall appearance, similar to comparison CT chest imaging. CT CHEST FINDINGS Cardiovascular: Cardiomegaly with predominantly biatrial enlargement. Coronary artery calcifications. Small pericardial effusion with fluid in the pericardial recesses. Atherosclerotic plaque within the normal caliber aorta. No acute luminal abnormality of the imaged aorta. No periaortic stranding or hemorrhage. Normal 3 vessel branching of the aortic arch. Shared origin of the brachiocephalic and left common carotid arteries. Splaying of the proximal great vessels with tortuosity secondary to large thyroid extending into the thoracic inlet. Mediastinum/Nodes: Massively enlarged, heterogeneous and multinodular thyroid gland. Extension of the thyroid gland into the thoracic thyroid dimensions. The left lobe thyroid gland measures up to 5.6 x 6.5 x 11.2 cm in conglomerate dimension. More moderate enlargement and nodularity of the right gland. Resulting right paratracheal esophageal shift. Endotracheal tube tip terminates  low in the trachea, 1 cm from the carina. Recommend retraction 2 cm to the mid trachea. Transesophageal tube tip terminates in the gastric body with side port distal to the GE junction. This type the presence of the transesophageal tube, the thoracic esophagus is patulous and fluid-filled. No worrisome mediastinal, hilar or axillary adenopathy. Lungs/Pleura: Small bilateral pleural effusions. Mixed dependent and passive atelectatic changes in the lungs. Additional bandlike areas of scarring and/or subsegmental atelectasis. Redemonstration of a solid nodule seen in the superior segment left lower lobe this measures 12 x 14 by 18 mm, previously 8 x 11 x 9 mm reflecting interval growth however the attenuation of this structure closely matches that of simple fluid. Musculoskeletal: Multilevel degenerative changes are present in the imaged portions of the spine. Features most pronounced T10-11. Dextrocurvature of the midthoracic spine. Mild levocurvature at the cervicothoracic junction. Additional degenerative changes in the bilateral shoulders. No acute or worrisome osseous lesion. Extensive circumferential body wall edema noted. CT ABDOMEN PELVIS FINDINGS Hepatobiliary: Some diffuse geographic regions of a [place] hypoattenuation with more focal sparing in the left lobe liver. More uniform enhancement on the delayed phase imaging. Hypoattenuating 1.7 cm lesion in the tip of the left lobe liver with some nodular discontinuous peripheral enhancement and centripetal filling on delayed phase imaging most suggestive of a hepatic hemangioma. Smooth liver surface contour. Normal gallbladder and. No significant biliary ductal dilatation. Pancreas: No pancreatic ductal dilatation or surrounding inflammatory changes. Spleen: Normal in size. No concerning splenic lesions. Adrenals/Urinary Tract: Normal adrenals. Kidneys are normally located with symmetric enhancement and excretion. Few small fluid attenuation cysts are present  bilaterally. Additional subcentimeter hypoattenuating foci seen in both kidneys too small to fully characterize on CT imaging but statistically likely benign. There are no suspicious renal lesion, urolithiasis or hydronephrosis. Moderate distention of the urinary bladder without other gross abnormality. Stomach/Bowel: Transesophageal tube tip and side port distal to the GE junction. Some mild thickening towards the gastric antrum postulated to peristaltic motion some mild diffuse mural thickening of both large and small bowel. Multiple to of small bowel loops are seen protruding into a large left groin hernia albeit without evidence of mechanical obstruction or more focal thickening to suggest strangulation/vascular compromise at this time. No other sites of bowel obstruction. Normal appendix. Scattered colonic diverticula without focal inflammation to suggest diverticulitis. Vascular/Lymphatic: Atherosclerotic calcifications within the abdominal aorta and branch vessels. No aneurysm or ectasia. Enlarged right groin adenopathy with low-attenuation nodes measuring up to 13 mm short axis (3/103). No other enlarged abdominopelvic nodes. Reproductive: Numerous heterogeneously calcified uterine fibroids are present. Ovarian tissue is  poorly visualized though no discrete concerning adnexal lesions are seen. Other: Large left groin hernia containing both fat and bowel, as described above. An additional ventral midline fat containing hernia containing small amount of low-attenuation fluid and extensive stranding of the herniated fat contents suggestive of least mild strangulation reactive change. No other bowel containing hernias. Extensive circumferential body wall edema. Musculoskeletal: Multilevel degenerative changes are present in the imaged portions of the spine. Grade 1 anterolisthesis L4 on 5 without spondylolysis. Partial ankylosis of the L5-S1 levels. Additional moderate degenerative change in the bilateral hips  and SI joints. IMPRESSION: CT head: 1. Intracerebral hemorrhage within the left frontoparietal lobes measuring up to 8 x 5.6 x 5.8 cm in size. Extensive surrounding hypoattenuating edema. Significant mass effect with global sulcal effacement of the left cerebral hemisphere, 18 mm of left to right midline shift, medialization of the left uncus and indentation of the contralateral right cerebral crus against the right tentorium cerebelli. 2. Background of diffuse parenchymal volume loss and microvascular angiopathy with intracranial atherosclerosis. 3. Extensive sites of scalp swelling versus edema. Correlate for tenderness. Findings most pronounced in the right temporal and left parieto-occipital regions. CT cervical spine: 1. No acute fracture or traumatic listhesis of the cervical spine. 2. Diffuse edematous changes of the posterior cervical musculature, left greater than right. Possibly related to more diffuse anasarca changes elsewhere. 3. Multilevel cervical spondylitic changes as detailed above. CT chest, abdomen and pelvis 1. Endotracheal tube tip terminates low in the trachea, 1 cm from the carina. Recommend retraction 2 cm to the mid trachea. 2. Appropriate positioning of the endotracheal tube. 3. Features could suggest underlying CHF/heart failure with cardiomegaly small bilateral effusions and pulmonary vascular congestion. Additional diffuse features of anasarca with circumferential body wall edema. Heterogeneous enhancement of the liver, may reflect some underlying hepatic steatosis or altered hepatic perfusion dynamics or hepatic congestion in the setting of heart failure. 4. Mixed dependent and passive atelectatic changes in the lung bases. 5. Massively enlarged, heterogeneous and multinodular thyroid gland, with extension of the thyroid gland into the thoracic inlet. 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 Similar appearance to comparison exam and in the setting of significant  comorbidities or limited life expectancy, no follow-up recommended (ref: J Am Coll Radiol. 2015 Feb;12(2): 143-50). 6. Redemonstration of a solid nodule in the superior segment left lower lobe this measures 12 x 14 x 18 mm, previously 8 x 11 x 9 mm reflecting interval growth however the attenuation of this structure closely matches that of simple fluid. Overall indeterminate though malignancy not fully excluded. Consider continued follow-up imaging if felt to be clinically warranted. 7. Fat and bowel containing left groin hernia without evidence of mechanical obstruction or strangulation at this level. 8. Additional fat containing upper abdominal ventral hernia with fatty stranding and trace free fluid concerning for fat strangulation. 9. Diffusely edematous parents of both large and small bowel, could correlate for clinical features of an enterocolitis, either infectious or inflammatory. Critical results were called by telephone at the time of interpretation on 04/18/2020 at 12:58 am to provider Dr. Mellody Dance, who verbally acknowledged these results. Electronically Signed   By: Kreg Shropshire M.D.   On: 04/18/2020 01:24   CT Cervical Spine Wo Contrast  Result Date: 04/18/2020 CLINICAL DATA:  Found down at 19:00 on 04/16/2020, posturing, history of heart disease EXAM: CT HEAD WITHOUT CONTRAST CT CERVICAL SPINE WITHOUT CONTRAST CT CHEST, ABDOMEN AND PELVIS WITH CONTRAST TECHNIQUE: Contiguous axial images were obtained from the base  of the skull through the vertex without intravenous contrast. Multidetector CT imaging of the cervical spine was performed without intravenous contrast. Multiplanar CT image reconstructions were also generated. Multidetector CT imaging of the chest, abdomen and pelvis was performed following the standard protocol during bolus administration of intravenous contrast. CONTRAST:  OMNIPAQUE IOHEXOL 300 MG/ML  SOLN COMPARISON:  CT head and chest angiography 11/26/2015 FINDINGS: CT HEAD  FINDINGS Brain: Massive intracerebral hemorrhage within the left frontoparietal lobes measuring up to 8 x 5.6 x 5.8 cm in size. Extensive surrounding hypoattenuating edema. Significant mass effect with global sulcal effacement of the left cerebral hemisphere, effacement of the left lateral ventricle, 18 mm of left to right midline shift, medial ization of the uncus and indentation of the contralateral cerebral crus against the right tentorium cerebelli. No other acute sites of intracranial hemorrhage or extra-axial collection. Findings on a background of diffuse parenchymal volume loss. Patchy areas of white matter hypoattenuation are most compatible with chronic microvascular angiopathy. Vascular: Atherosclerotic calcification of the carotid siphons and intradural vertebral arteries. No hyperdense vessel. Skull: Right temporal and left parieto-occipital scalp swelling and thickening. No subjacent calvarial fractures. Mild edematous changes of the scalp. Sinuses/Orbits: Diffuse thickening in the ethmoids with pneumatized secretions in the posterior nasopharynx and nasal passages. Remaining paranasal sinuses and mastoid air cells are predominantly clear. Middle ear cavities are clear. Orbital structures are unremarkable aside from prior lens extractions. Other: Edentulous.  Transesophageal and endotracheal tubes in place. CT CERVICAL FINDINGS Alignment: Slight reversal of normal cervical lordosis at the C2-3 level. Minimal anterolisthesis C2 on C3 favored to be on a degenerative basis. Similar likely degenerative anterolisthesis noted C7 on T1. No evidence of traumatic listhesis. No abnormally widened, perched or jumped facets. Normal alignment of the craniocervical and atlantoaxial articulations accounting for rightward cranial rotation and slight rightward lateral cervical flexion. Skull base and vertebrae: The osseous structures appear diffusely demineralized which may limit detection of small or nondisplaced  fractures. No acute skull base fracture. No vertebral body fracture or height loss. Soft tissues and spinal canal: No prevertebral swelling, fluid or gas. Mild edematous changes noted along the nuchal ligament and cervical musculature posteriorly. Endotracheal and transesophageal tubes are in place. Secretions noted in the posterior naso and oropharynx as imaged. Cervical carotid atherosclerosis. Disc levels: Multilevel intervertebral disc height loss with spondylitic endplate changes. Larger disc osteophyte complexes are present C2-3, C3-4 and C6-7 which partially efface the ventral thecal sac without significant resulting canal impingement. Multilevel uncinate spurring and facet hypertrophic changes result in mild-to-moderate foraminal narrowing most pronounced at the upper cervical levels. Other: Massively enlarged, heterogeneous multinodular thyroid gland extending inferiorly into the thoracic inlet within overall appearance, similar to comparison CT chest imaging. CT CHEST FINDINGS Cardiovascular: Cardiomegaly with predominantly biatrial enlargement. Coronary artery calcifications. Small pericardial effusion with fluid in the pericardial recesses. Atherosclerotic plaque within the normal caliber aorta. No acute luminal abnormality of the imaged aorta. No periaortic stranding or hemorrhage. Normal 3 vessel branching of the aortic arch. Shared origin of the brachiocephalic and left common carotid arteries. Splaying of the proximal great vessels with tortuosity secondary to large thyroid extending into the thoracic inlet. Mediastinum/Nodes: Massively enlarged, heterogeneous and multinodular thyroid gland. Extension of the thyroid gland into the thoracic thyroid dimensions. The left lobe thyroid gland measures up to 5.6 x 6.5 x 11.2 cm in conglomerate dimension. More moderate enlargement and nodularity of the right gland. Resulting right paratracheal esophageal shift. Endotracheal tube tip terminates low in the  trachea, 1 cm from the carina. Recommend retraction 2 cm to the mid trachea. Transesophageal tube tip terminates in the gastric body with side port distal to the GE junction. This type the presence of the transesophageal tube, the thoracic esophagus is patulous and fluid-filled. No worrisome mediastinal, hilar or axillary adenopathy. Lungs/Pleura: Small bilateral pleural effusions. Mixed dependent and passive atelectatic changes in the lungs. Additional bandlike areas of scarring and/or subsegmental atelectasis. Redemonstration of a solid nodule seen in the superior segment left lower lobe this measures 12 x 14 by 18 mm, previously 8 x 11 x 9 mm reflecting interval growth however the attenuation of this structure closely matches that of simple fluid. Musculoskeletal: Multilevel degenerative changes are present in the imaged portions of the spine. Features most pronounced T10-11. Dextrocurvature of the midthoracic spine. Mild levocurvature at the cervicothoracic junction. Additional degenerative changes in the bilateral shoulders. No acute or worrisome osseous lesion. Extensive circumferential body wall edema noted. CT ABDOMEN PELVIS FINDINGS Hepatobiliary: Some diffuse geographic regions of a [place] hypoattenuation with more focal sparing in the left lobe liver. More uniform enhancement on the delayed phase imaging. Hypoattenuating 1.7 cm lesion in the tip of the left lobe liver with some nodular discontinuous peripheral enhancement and centripetal filling on delayed phase imaging most suggestive of a hepatic hemangioma. Smooth liver surface contour. Normal gallbladder and. No significant biliary ductal dilatation. Pancreas: No pancreatic ductal dilatation or surrounding inflammatory changes. Spleen: Normal in size. No concerning splenic lesions. Adrenals/Urinary Tract: Normal adrenals. Kidneys are normally located with symmetric enhancement and excretion. Few small fluid attenuation cysts are present bilaterally.  Additional subcentimeter hypoattenuating foci seen in both kidneys too small to fully characterize on CT imaging but statistically likely benign. There are no suspicious renal lesion, urolithiasis or hydronephrosis. Moderate distention of the urinary bladder without other gross abnormality. Stomach/Bowel: Transesophageal tube tip and side port distal to the GE junction. Some mild thickening towards the gastric antrum postulated to peristaltic motion some mild diffuse mural thickening of both large and small bowel. Multiple to of small bowel loops are seen protruding into a large left groin hernia albeit without evidence of mechanical obstruction or more focal thickening to suggest strangulation/vascular compromise at this time. No other sites of bowel obstruction. Normal appendix. Scattered colonic diverticula without focal inflammation to suggest diverticulitis. Vascular/Lymphatic: Atherosclerotic calcifications within the abdominal aorta and branch vessels. No aneurysm or ectasia. Enlarged right groin adenopathy with low-attenuation nodes measuring up to 13 mm short axis (3/103). No other enlarged abdominopelvic nodes. Reproductive: Numerous heterogeneously calcified uterine fibroids are present. Ovarian tissue is poorly visualized though no discrete concerning adnexal lesions are seen. Other: Large left groin hernia containing both fat and bowel, as described above. An additional ventral midline fat containing hernia containing small amount of low-attenuation fluid and extensive stranding of the herniated fat contents suggestive of least mild strangulation reactive change. No other bowel containing hernias. Extensive circumferential body wall edema. Musculoskeletal: Multilevel degenerative changes are present in the imaged portions of the spine. Grade 1 anterolisthesis L4 on 5 without spondylolysis. Partial ankylosis of the L5-S1 levels. Additional moderate degenerative change in the bilateral hips and SI joints.  IMPRESSION: CT head: 1. Intracerebral hemorrhage within the left frontoparietal lobes measuring up to 8 x 5.6 x 5.8 cm in size. Extensive surrounding hypoattenuating edema. Significant mass effect with global sulcal effacement of the left cerebral hemisphere, 18 mm of left to right midline shift, medialization of the left uncus and indentation of the contralateral right  cerebral crus against the right tentorium cerebelli. 2. Background of diffuse parenchymal volume loss and microvascular angiopathy with intracranial atherosclerosis. 3. Extensive sites of scalp swelling versus edema. Correlate for tenderness. Findings most pronounced in the right temporal and left parieto-occipital regions. CT cervical spine: 1. No acute fracture or traumatic listhesis of the cervical spine. 2. Diffuse edematous changes of the posterior cervical musculature, left greater than right. Possibly related to more diffuse anasarca changes elsewhere. 3. Multilevel cervical spondylitic changes as detailed above. CT chest, abdomen and pelvis 1. Endotracheal tube tip terminates low in the trachea, 1 cm from the carina. Recommend retraction 2 cm to the mid trachea. 2. Appropriate positioning of the endotracheal tube. 3. Features could suggest underlying CHF/heart failure with cardiomegaly small bilateral effusions and pulmonary vascular congestion. Additional diffuse features of anasarca with circumferential body wall edema. Heterogeneous enhancement of the liver, may reflect some underlying hepatic steatosis or altered hepatic perfusion dynamics or hepatic congestion in the setting of heart failure. 4. Mixed dependent and passive atelectatic changes in the lung bases. 5. Massively enlarged, heterogeneous and multinodular thyroid gland, with extension of the thyroid gland into the thoracic inlet. 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 Similar appearance to comparison exam and in the setting of significant comorbidities or limited  life expectancy, no follow-up recommended (ref: J Am Coll Radiol. 2015 Feb;12(2): 143-50). 6. Redemonstration of a solid nodule in the superior segment left lower lobe this measures 12 x 14 x 18 mm, previously 8 x 11 x 9 mm reflecting interval growth however the attenuation of this structure closely matches that of simple fluid. Overall indeterminate though malignancy not fully excluded. Consider continued follow-up imaging if felt to be clinically warranted. 7. Fat and bowel containing left groin hernia without evidence of mechanical obstruction or strangulation at this level. 8. Additional fat containing upper abdominal ventral hernia with fatty stranding and trace free fluid concerning for fat strangulation. 9. Diffusely edematous parents of both large and small bowel, could correlate for clinical features of an enterocolitis, either infectious or inflammatory. Critical results were called by telephone at the time of interpretation on 04/18/2020 at 12:58 am to provider Dr. Mellody Dance, who verbally acknowledged these results. Electronically Signed   By: Kreg Shropshire M.D.   On: 04/18/2020 01:24   CT CHEST ABDOMEN PELVIS W CONTRAST  Result Date: 04/18/2020 CLINICAL DATA:  Found down at 19:00 on 04/16/2020, posturing, history of heart disease EXAM: CT HEAD WITHOUT CONTRAST CT CERVICAL SPINE WITHOUT CONTRAST CT CHEST, ABDOMEN AND PELVIS WITH CONTRAST TECHNIQUE: Contiguous axial images were obtained from the base of the skull through the vertex without intravenous contrast. Multidetector CT imaging of the cervical spine was performed without intravenous contrast. Multiplanar CT image reconstructions were also generated. Multidetector CT imaging of the chest, abdomen and pelvis was performed following the standard protocol during bolus administration of intravenous contrast. CONTRAST:  OMNIPAQUE IOHEXOL 300 MG/ML  SOLN COMPARISON:  CT head and chest angiography 11/26/2015 FINDINGS: CT HEAD FINDINGS Brain: Massive  intracerebral hemorrhage within the left frontoparietal lobes measuring up to 8 x 5.6 x 5.8 cm in size. Extensive surrounding hypoattenuating edema. Significant mass effect with global sulcal effacement of the left cerebral hemisphere, effacement of the left lateral ventricle, 18 mm of left to right midline shift, medial ization of the uncus and indentation of the contralateral cerebral crus against the right tentorium cerebelli. No other acute sites of intracranial hemorrhage or extra-axial collection. Findings on a background of diffuse parenchymal volume  loss. Patchy areas of white matter hypoattenuation are most compatible with chronic microvascular angiopathy. Vascular: Atherosclerotic calcification of the carotid siphons and intradural vertebral arteries. No hyperdense vessel. Skull: Right temporal and left parieto-occipital scalp swelling and thickening. No subjacent calvarial fractures. Mild edematous changes of the scalp. Sinuses/Orbits: Diffuse thickening in the ethmoids with pneumatized secretions in the posterior nasopharynx and nasal passages. Remaining paranasal sinuses and mastoid air cells are predominantly clear. Middle ear cavities are clear. Orbital structures are unremarkable aside from prior lens extractions. Other: Edentulous.  Transesophageal and endotracheal tubes in place. CT CERVICAL FINDINGS Alignment: Slight reversal of normal cervical lordosis at the C2-3 level. Minimal anterolisthesis C2 on C3 favored to be on a degenerative basis. Similar likely degenerative anterolisthesis noted C7 on T1. No evidence of traumatic listhesis. No abnormally widened, perched or jumped facets. Normal alignment of the craniocervical and atlantoaxial articulations accounting for rightward cranial rotation and slight rightward lateral cervical flexion. Skull base and vertebrae: The osseous structures appear diffusely demineralized which may limit detection of small or nondisplaced fractures. No acute skull  base fracture. No vertebral body fracture or height loss. Soft tissues and spinal canal: No prevertebral swelling, fluid or gas. Mild edematous changes noted along the nuchal ligament and cervical musculature posteriorly. Endotracheal and transesophageal tubes are in place. Secretions noted in the posterior naso and oropharynx as imaged. Cervical carotid atherosclerosis. Disc levels: Multilevel intervertebral disc height loss with spondylitic endplate changes. Larger disc osteophyte complexes are present C2-3, C3-4 and C6-7 which partially efface the ventral thecal sac without significant resulting canal impingement. Multilevel uncinate spurring and facet hypertrophic changes result in mild-to-moderate foraminal narrowing most pronounced at the upper cervical levels. Other: Massively enlarged, heterogeneous multinodular thyroid gland extending inferiorly into the thoracic inlet within overall appearance, similar to comparison CT chest imaging. CT CHEST FINDINGS Cardiovascular: Cardiomegaly with predominantly biatrial enlargement. Coronary artery calcifications. Small pericardial effusion with fluid in the pericardial recesses. Atherosclerotic plaque within the normal caliber aorta. No acute luminal abnormality of the imaged aorta. No periaortic stranding or hemorrhage. Normal 3 vessel branching of the aortic arch. Shared origin of the brachiocephalic and left common carotid arteries. Splaying of the proximal great vessels with tortuosity secondary to large thyroid extending into the thoracic inlet. Mediastinum/Nodes: Massively enlarged, heterogeneous and multinodular thyroid gland. Extension of the thyroid gland into the thoracic thyroid dimensions. The left lobe thyroid gland measures up to 5.6 x 6.5 x 11.2 cm in conglomerate dimension. More moderate enlargement and nodularity of the right gland. Resulting right paratracheal esophageal shift. Endotracheal tube tip terminates low in the trachea, 1 cm from the  carina. Recommend retraction 2 cm to the mid trachea. Transesophageal tube tip terminates in the gastric body with side port distal to the GE junction. This type the presence of the transesophageal tube, the thoracic esophagus is patulous and fluid-filled. No worrisome mediastinal, hilar or axillary adenopathy. Lungs/Pleura: Small bilateral pleural effusions. Mixed dependent and passive atelectatic changes in the lungs. Additional bandlike areas of scarring and/or subsegmental atelectasis. Redemonstration of a solid nodule seen in the superior segment left lower lobe this measures 12 x 14 by 18 mm, previously 8 x 11 x 9 mm reflecting interval growth however the attenuation of this structure closely matches that of simple fluid. Musculoskeletal: Multilevel degenerative changes are present in the imaged portions of the spine. Features most pronounced T10-11. Dextrocurvature of the midthoracic spine. Mild levocurvature at the cervicothoracic junction. Additional degenerative changes in the bilateral shoulders. No acute or worrisome  osseous lesion. Extensive circumferential body wall edema noted. CT ABDOMEN PELVIS FINDINGS Hepatobiliary: Some diffuse geographic regions of a [place] hypoattenuation with more focal sparing in the left lobe liver. More uniform enhancement on the delayed phase imaging. Hypoattenuating 1.7 cm lesion in the tip of the left lobe liver with some nodular discontinuous peripheral enhancement and centripetal filling on delayed phase imaging most suggestive of a hepatic hemangioma. Smooth liver surface contour. Normal gallbladder and. No significant biliary ductal dilatation. Pancreas: No pancreatic ductal dilatation or surrounding inflammatory changes. Spleen: Normal in size. No concerning splenic lesions. Adrenals/Urinary Tract: Normal adrenals. Kidneys are normally located with symmetric enhancement and excretion. Few small fluid attenuation cysts are present bilaterally. Additional  subcentimeter hypoattenuating foci seen in both kidneys too small to fully characterize on CT imaging but statistically likely benign. There are no suspicious renal lesion, urolithiasis or hydronephrosis. Moderate distention of the urinary bladder without other gross abnormality. Stomach/Bowel: Transesophageal tube tip and side port distal to the GE junction. Some mild thickening towards the gastric antrum postulated to peristaltic motion some mild diffuse mural thickening of both large and small bowel. Multiple to of small bowel loops are seen protruding into a large left groin hernia albeit without evidence of mechanical obstruction or more focal thickening to suggest strangulation/vascular compromise at this time. No other sites of bowel obstruction. Normal appendix. Scattered colonic diverticula without focal inflammation to suggest diverticulitis. Vascular/Lymphatic: Atherosclerotic calcifications within the abdominal aorta and branch vessels. No aneurysm or ectasia. Enlarged right groin adenopathy with low-attenuation nodes measuring up to 13 mm short axis (3/103). No other enlarged abdominopelvic nodes. Reproductive: Numerous heterogeneously calcified uterine fibroids are present. Ovarian tissue is poorly visualized though no discrete concerning adnexal lesions are seen. Other: Large left groin hernia containing both fat and bowel, as described above. An additional ventral midline fat containing hernia containing small amount of low-attenuation fluid and extensive stranding of the herniated fat contents suggestive of least mild strangulation reactive change. No other bowel containing hernias. Extensive circumferential body wall edema. Musculoskeletal: Multilevel degenerative changes are present in the imaged portions of the spine. Grade 1 anterolisthesis L4 on 5 without spondylolysis. Partial ankylosis of the L5-S1 levels. Additional moderate degenerative change in the bilateral hips and SI joints.  IMPRESSION: CT head: 1. Intracerebral hemorrhage within the left frontoparietal lobes measuring up to 8 x 5.6 x 5.8 cm in size. Extensive surrounding hypoattenuating edema. Significant mass effect with global sulcal effacement of the left cerebral hemisphere, 18 mm of left to right midline shift, medialization of the left uncus and indentation of the contralateral right cerebral crus against the right tentorium cerebelli. 2. Background of diffuse parenchymal volume loss and microvascular angiopathy with intracranial atherosclerosis. 3. Extensive sites of scalp swelling versus edema. Correlate for tenderness. Findings most pronounced in the right temporal and left parieto-occipital regions. CT cervical spine: 1. No acute fracture or traumatic listhesis of the cervical spine. 2. Diffuse edematous changes of the posterior cervical musculature, left greater than right. Possibly related to more diffuse anasarca changes elsewhere. 3. Multilevel cervical spondylitic changes as detailed above. CT chest, abdomen and pelvis 1. Endotracheal tube tip terminates low in the trachea, 1 cm from the carina. Recommend retraction 2 cm to the mid trachea. 2. Appropriate positioning of the endotracheal tube. 3. Features could suggest underlying CHF/heart failure with cardiomegaly small bilateral effusions and pulmonary vascular congestion. Additional diffuse features of anasarca with circumferential body wall edema. Heterogeneous enhancement of the liver, may reflect some underlying hepatic steatosis  or altered hepatic perfusion dynamics or hepatic congestion in the setting of heart failure. 4. Mixed dependent and passive atelectatic changes in the lung bases. 5. Massively enlarged, heterogeneous and multinodular thyroid gland, with extension of the thyroid gland into the thoracic inlet. 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 Similar appearance to comparison exam and in the setting of significant comorbidities or limited  life expectancy, no follow-up recommended (ref: J Am Coll Radiol. 2015 Feb;12(2): 143-50). 6. Redemonstration of a solid nodule in the superior segment left lower lobe this measures 12 x 14 x 18 mm, previously 8 x 11 x 9 mm reflecting interval growth however the attenuation of this structure closely matches that of simple fluid. Overall indeterminate though malignancy not fully excluded. Consider continued follow-up imaging if felt to be clinically warranted. 7. Fat and bowel containing left groin hernia without evidence of mechanical obstruction or strangulation at this level. 8. Additional fat containing upper abdominal ventral hernia with fatty stranding and trace free fluid concerning for fat strangulation. 9. Diffusely edematous parents of both large and small bowel, could correlate for clinical features of an enterocolitis, either infectious or inflammatory. Critical results were called by telephone at the time of interpretation on 04/18/2020 at 12:58 am to provider Dr. Mellody Dance, who verbally acknowledged these results. Electronically Signed   By: Kreg Shropshire M.D.   On: 04/18/2020 01:24   DG Chest Portable 1 View  Result Date: 04-20-2020 CLINICAL DATA:  Intubated EXAM: PORTABLE CHEST 1 VIEW COMPARISON:  11/26/2015 chest radiograph and CT FINDINGS: Endotracheal tube tip less than a cm superior to the carina. Tracheal deviation to the right secondary to known enlarged substernal thyroid. Esophageal tube tip below the diaphragm but incompletely visualized. Cardiomegaly with central vascular congestion. Aortic atherosclerosis. No pneumothorax. Possible developing consolidation at the left lung base. IMPRESSION: 1. Endotracheal tube tip less than a cm superior to the carina. 2. Cardiomegaly with vascular congestion and possible developing consolidation/pneumonia at the left lung base. 3. Left paratracheal mass with deviation of trachea to the right, corresponding to known massively enlarged substernal thyroid.  Electronically Signed   By: Jasmine Pang M.D.   On: 04-20-2020 22:49    PHYSICAL EXAM  Temp:  [98.4 F (36.9 C)-100.8 F (38.2 C)] 100.8 F (38.2 C) (02/22 1508) Pulse Rate:  [25-114] 43 (02/22 1900) Resp:  [7-28] 20 (02/22 1900) BP: (106-155)/(60-118) 106/73 (02/22 1900) SpO2:  [93 %-100 %] 100 % (02/22 1900) FiO2 (%):  [40 %-100 %] 40 % (02/22 1508) Weight:  [57.4 kg-59.5 kg] 57.4 kg (02/22 0353)  General - Well nourished, well developed, intubated on fentanyl.  Ophthalmologic - fundi not visualized due to noncooperation.  Cardiovascular - irregularly irregular heart rate and rhythm.  Neuro - intubated on sedation, eyes closed, not following commands. With forced eye opening, eyes in mid position, not blinking to visual threat, doll's eyes absent, not tracking, bilateral pupil 8 mm, fixed. Corneal reflex weak bilaterally, gag and cough present. Breathing not over the vent with RR setting 20.  Facial symmetry not able to test due to ET tube.  Tongue protrusion not cooperative. On pain stimulation, left upper extremity mildly against gravity but able to localize to pain, slight withdraw left lower extremity, flicker movement of right lower extremity on pain stimulation. DTR diminished and no babinski. Sensation, coordination and gait not tested.   ASSESSMENT/PLAN Ms. NICA FRISKE is a 87 y.o. female with history of A. fib on Xarelto, hypertension, goiter admitted for unresponsiveness. No tPA  given due to ICH.    ICH: Large left ICH with significant midline shift, likely due to hypertension in the setting of Xarelto use  CT head showed large left ICH with significant midline shift, small IVH  Repeat CT head on hold given no change in management  SCDs for VTE prophylaxis  Xarelto (rivaroxaban) daily prior to admission, now on No antithrombotic.  Reversed with Kcentra.  Disposition: Pending  Code status - full code - pending GOC discussion with family in the  afternoon.  Cerebral edema  CT shows significant midline shift  Unsurvivable  ICH score 4  On 3% saline @ 75  Pending GOC discussion  HTN  Currently off Cleviprex  BP goal less than 140  Other Stroke Risk Factors  Advanced age  Other Active Problems  Goiter  Hospital day # 0  This patient is critically ill due to large left ICH with significant mass-effect, A. fib and at significant risk of neurological worsening, death form brain herniation, cerebral edema, seizure, heart failure, cardio arrest. This patient's care requires constant monitoring of vital signs, hemodynamics, respiratory and cardiac monitoring, review of multiple databases, neurological assessment, discussion with family, other specialists and medical decision making of high complexity. I spent 30 minutes of neurocritical care time in the care of this patient.  Marvel PlanJindong Jun Rightmyer, MD PhD Stroke Neurology 04/18/2020 7:32 PM    To contact Stroke Continuity provider, please refer to [email or web address]. After hours, contact General Neurology

## 2020-04-18 NOTE — Progress Notes (Signed)
Family has reversed code status.. Will await palliative input. Fentanyl for comfort for now while they sort through this difficult situation. Further escalation of care is not indicated.  Myrla Halsted MD PCCM

## 2020-04-19 DIAGNOSIS — I4819 Other persistent atrial fibrillation: Secondary | ICD-10-CM

## 2020-04-19 DIAGNOSIS — I629 Nontraumatic intracranial hemorrhage, unspecified: Secondary | ICD-10-CM

## 2020-04-19 DIAGNOSIS — R4189 Other symptoms and signs involving cognitive functions and awareness: Secondary | ICD-10-CM | POA: Diagnosis not present

## 2020-04-19 DIAGNOSIS — Z515 Encounter for palliative care: Secondary | ICD-10-CM | POA: Diagnosis not present

## 2020-04-19 DIAGNOSIS — I610 Nontraumatic intracerebral hemorrhage in hemisphere, subcortical: Secondary | ICD-10-CM | POA: Diagnosis not present

## 2020-04-19 LAB — GLUCOSE, CAPILLARY
Glucose-Capillary: 118 mg/dL — ABNORMAL HIGH (ref 70–99)
Glucose-Capillary: 133 mg/dL — ABNORMAL HIGH (ref 70–99)
Glucose-Capillary: 76 mg/dL (ref 70–99)
Glucose-Capillary: 103 mg/dL — ABNORMAL HIGH (ref 70–99)
Glucose-Capillary: 95 mg/dL (ref 70–99)
Glucose-Capillary: 88 mg/dL (ref 70–99)

## 2020-04-19 LAB — SODIUM: Sodium: 148 mmol/L — ABNORMAL HIGH (ref 135–145)

## 2020-04-19 LAB — URINE CULTURE: Culture: NO GROWTH

## 2020-04-19 MED ORDER — METOPROLOL TARTRATE 5 MG/5ML IV SOLN: 5.0000 mg | INTRAVENOUS | Status: DC | PRN

## 2020-04-19 MED ORDER — METOPROLOL TARTRATE 5 MG/5ML IV SOLN
5.0000 mg | INTRAVENOUS | Status: DC | PRN
  Administered 2020-04-19 (×2): 5 mg via INTRAVENOUS
  Filled 2020-04-19 (×2): qty 5

## 2020-04-19 MED ORDER — LABETALOL HCL 5 MG/ML IV SOLN
10.0000 mg | INTRAVENOUS | Status: DC | PRN
  Administered 2020-04-19: 20 mg via INTRAVENOUS
  Filled 2020-04-19: qty 4

## 2020-04-19 MED ORDER — NICARDIPINE HCL IN NACL 20-0.86 MG/200ML-% IV SOLN
0.0000 mg/h | INTRAVENOUS | Status: DC
  Administered 2020-04-20: 1.25 mg/h via INTRAVENOUS
  Administered 2020-04-20 (×2): 5 mg/h via INTRAVENOUS
  Administered 2020-04-21: 1 mg/h via INTRAVENOUS
  Filled 2020-04-19 (×4): qty 200

## 2020-04-19 MED ORDER — SODIUM CHLORIDE 0.9 % IV SOLN: INTRAVENOUS | Status: DC

## 2020-04-19 NOTE — Progress Notes (Signed)
[facility] RN notified at 2107 of HTN SBP 160s despite PRN Lopressor. Per Neuro's note, goal SBP <140. Pola Corn RN to discuss with [facility] MD. Awaiting further orders.  Will continue to monitor.

## 2020-04-19 NOTE — Progress Notes (Signed)
This chaplain is present for F/U spiritual care.  Family is not at the bedside at the time of the visit.  The chaplain is updated by the Pt. RN-Brian.  The chaplain reflected bedside words of comfort to the Pt. based on the visit with the daughter on Tuesday.  The chaplain understands the Pt. code status was changed based on the daughter from [place] desire to have more time to process next steps. The RN accepted the chaplain's invitation to communicate a family spiritual care need as seen or requested.

## 2020-04-19 NOTE — Progress Notes (Signed)
eLink Physician-Brief Progress Note Patient Name: Mary Chaney DOB: 02-24-1928 MRN: [id number]   Date of Service  04/19/2020  HPI/Events of Note  BP 164/96 after receiving PRN Lopressor 5 mg iv, patient's blood pressure parameter is < 140 mmHg.  eICU Interventions  Lopressor discontinued and Labetalol 10-20 mg iv Q 2 hours PRN SBP > 140 mmHg ordered.        Mary Chaney 04/19/2020, 10:07 PM

## 2020-04-19 NOTE — Progress Notes (Signed)
Notified [facility] again regarding refractory HTN. Awaiting further orders.

## 2020-04-19 NOTE — Progress Notes (Signed)
[facility] notified that SPB still >140 goal (currently 156/90 (108) 20 minutes after PRN labetalol given). Pola Corn RN to notify [facility] MD.

## 2020-04-19 NOTE — Progress Notes (Signed)
Initial Nutrition Assessment  DOCUMENTATION CODES:   Not applicable  INTERVENTION:   Given pt's family have asked for a few days to process and decide on GOC, recommend initiation of nutrition support via OGT. Consider: -Vital AF 1.2 @ 64ml/hr, advance 72ml/hr Q4H (or as tolerated) to goal rate of 4ml/hr ( ) At goal, TF would provide 1152 kcals, 72g protein, free water  Note, abd xray needs to be obtained to confirm OGT tip placement prior to initiation of TF.   Monitor for results of ongoing GOC discussions and make adjustments to nutrition plan of care as appropriate.    NUTRITION DIAGNOSIS:   Inadequate oral intake related to inability to eat as evidenced by NPO status.    GOAL:   Provide needs based on ASPEN/SCCM guidelines    MONITOR:   Vent status,Weight trends,Labs,I & O's,Other (Comment) (GOC discussions)  REASON FOR ASSESSMENT:   Ventilator    ASSESSMENT:   Pt with PMH significant for Afib, HTN, and goiter admitted with large L ICH w/ significant midline shift and global sulcal effacement. Not surgical candidate per Neurosurgery.  2/21 intubated  Neurology notes that pt's neuro exam today was declined from yesterday.  PMT and Neurology have been meeting with pt's family to discuss pt's prognosis. Pt noted to be unlikely to have a functional recovery; comfort care measures have been recommended. Family informed PMT that they would like everything possible done for the pt. Per Neurology, pt's family would like a couple of days of pt receiving full support to allow the pt some "time to heal" and to provide the family with time to decide on final GOC. In the meantime, recommend initiation of nutrition support via OGT until final decision is made. PMT will continue to follow.   Note, abd xray needs to be obtained to confirm OGT placement prior to initiation of tube feeding.   Patient is currently intubated on ventilator support MV: 6.6 L/min Temp  (24hrs), Avg:99 F (37.2 C), Min:98 F (36.7 C), Max:99.6 F (37.6 C)  UOP: x24 hours  Medications: ss novolog Q4H, protonix IVF: NS @ 21ml/hr Labs: Na 148 (H) CBGs 133-76-103  Diet Order:   Diet Order            Diet NPO time specified  Diet effective now                 EDUCATION NEEDS:   Not appropriate for education at this time  Skin:  Skin Assessment: Reviewed RN Assessment  Last BM:  PTA  Height:   Ht Readings from Last 1 Encounters:  04/04/2020 4\' 10"  (1.473 m)    Weight:  No significant weight changes noted.  Wt Readings from Last 10 Encounters:  04/19/20 55.2 kg  02/02/20 56.2 kg  12/24/19 57.5 kg  12/17/18 56.1 kg  12/01/18 56.5 kg  01/02/18 56.7 kg  07/04/17 58.1 kg  05/22/17 58.4 kg  04/28/17 58.4 kg  10/04/16 57.1 kg   BMI:  Body mass index is 25.43 kg/m.  Estimated Nutritional Needs:   Kcal:  1100  Protein:  65-80 grams  Fluid:  >1.2L    12/04/16, MS, RD, LDN RD pager number and weekend/on-call pager number located in Amion.

## 2020-04-19 NOTE — Progress Notes (Signed)
NAME:  Mary Chaney, MRN:  [id number], DOB:  06-Feb-1928, LOS: 1 ADMISSION DATE:  05/06/2020, CONSULTATION DATE:  04/19/20 REFERRING MD:  EDP, CHIEF COMPLAINT:  unresponsive   Brief History:  87 y.o. F with PMH of Atrial Fibrillation on Xarelto, HTN, multi-nodular goiter who was found unresponsive by family and has large fronto-parietal ICH with mass effect and global effacement.  Intubated and PCCM consulted for admission.  History of Present Illness:  Mary Chaney is a 87 y.o. F with PMH of Atrial Fibrillation on Xarelto, HTN, multi-nodular goiter who was found unresponsive by family, last known well around 7pm.  Pt was breathing, but unresponsive on EMS arrival and was intubated in the ED.  ED work-up significant for [facility] showing Intracerebral hemorrhage within the left frontoparietal lobes with extensive surrounding hypoattenuating edema and significant mass effect with global sulcal effacement. Labs grossly unremarkable, INR 1.3.   CXR with possible developing LLL infiltrate.  Per neurosurgery, pt is not a surgical candidate.  She was given Kcentra and PCCM consulted for admission  Past Medical History:   has a past medical history of Atrial fibrillation (HCC), Hemorrhoids, Hypertension, and Toxic multinodular goiter.  Significant Hospital Events:  2/22 Admit to PCCM  Consults:  Neurosurgery Neurology  Procedures:  2/22 ETT >>  Significant Diagnostic Tests:  2/22 CT head/abdomen/pelvis>>Intracerebral hemorrhage within the left frontoparietal lobes measuring up to 8 x 5.6 x 5.8 cm in size. Extensive surrounding hypoattenuating edema. Significant mass effect with global sulcal effacement of the left cerebral hemisphere, 18 mm of left to right midline shift, medialization of the left uncus and indentation of the contralateral right cerebral crus against the right tentorium cerebelli.  Micro Data:  2/21 SARS/ flu >> neg 2/22 MRSA  >> neg 2/22 BCx >>  2/22 UCx  >>neg  Antimicrobials:  n/a  Interim History / Subjective:  Off hypertonic saline Fentanyl gtt stopped last night 2200 Declining neuro exam from yesterday tmax 100.8 Partial code reversed to full code last evening by daughters Overnight, some afib with RVR, improved after prn lopressor   Objective   Blood pressure 110/69, pulse (!) 112, temperature 98 F (36.7 C), temperature source Axillary, resp. rate 20, height 4\' 10"  (1.473 m), weight 55.2 kg, SpO2 100 %.    Vent Mode: PRVC FiO2 (%):  [40 %] 40 % Set Rate:  [20 bmp] 20 bmp Vt Set:  [330 mL] 330 mL PEEP:  [5 cmH20] 5 cmH20 Plateau Pressure:  [15 cmH20-16 cmH20] 16 cmH20   Intake/Output Summary (Last 24 hours) at 04/19/2020 0809 Last data filed at 04/19/2020 0539 Gross per 24 hour  Intake 715.38 ml  Output 570 ml  Net 145.38 ml   Filed Weights   2020-05-06 2248 04/18/20 0353 04/19/20 0259  Weight: 59.5 kg 57.4 kg 55.2 kg   General:  Critically ill thin elderly female unresponsive on MV HEENT: MM pink/moist, pupils dilated/ fixed, weak left corneal, absent right corneal, ETT/ OGT, no gag Neuro: does not open eyes any more, minimal withdrawal in LUE, faint triple flex in LLE, flaccid on right  CV: rr ir, no murmur PULM:  Non labored, CTA, apneic on PSV, weak cough, no secretions GI: soft, bs active, foley  Extremities: warm/dry, no edema  Skin: no rashes   UOP 570 ml/ 24hr, ~0.4 ml/kg/ hr Even I/O balance, net +100 ml  No labs or CXR today   Resolved Hospital Problem list     Assessment & Plan:   Large L fronto-parietal ICH  with midline shift and global sulcal effacement Not a surgical candidate per NS given age and xarelto use, likely unsurvivable injury  P: - catastrophic ICH, however family is still processing information, reversed partial code last night after GOC meeting with Dr. Katrinka Blazing and Dr. Roda Shutters yesterday  - PMT following, appreciate assistance - anticipate ongoing GOC discussions with family today as her  clinical exam has continued to deteriorate from yesterday.  I am concerned that even with full aggressive care, patient will still not survive this event.  She still has some brainstem reflexes intact but less than yesterday; suspect at some point she will complete herniation  - continue supportive care and blood pressure control, prn apresoline as needed   Acute respiratory insufficiency -Continue MV support, 4-8cc/kg IBW with goal Pplat <30 and DP<15  -VAP prevention protocol/ PPI -PAD protocol for sedation> not needed  -wean FiO2 as able for SpO2 >92%  -failed am SBT- apneic   Atrial Fibrillation, HTN Rate controlled, last echo with preserved EF P: -prn lopressor - tele monitoring, in/ out of NSR/ afib but remains rate controlled  Toxic Multi-nodular Goiter -continue home Tapazole  Best practice (evaluated daily)  Diet: NPO Pain/Anxiety/Delirium protocol (if indicated): n/a VAP protocol (if indicated): HOB 30 degrees, suction prn DVT prophylaxis: SCD's only given ICH GI prophylaxis: protonix Glucose control: SSI Mobility: bed rest Disposition:ICU  Goals of Care:  Code Status: full code - ongoing discussions, hopeful to discuss with daughters again this morning. CPR in this setting would be futile   Labs   CBC: Recent Labs  Lab 04/23/2020 2239 04/18/20 0002 04/18/20 0340 04/18/20 0747  WBC 8.5  --   --  9.3  NEUTROABS 7.5  --   --   --   HGB 14.1 12.9 13.6 12.4  HCT 45.4 38.0 40.0 38.5  MCV 91.0  --   --  89.7  PLT 202  --   --  172    Basic Metabolic Panel: Recent Labs  Lab 04/11/2020 2239 04/18/20 0002 04/18/20 0340 04/18/20 0849 04/18/20 1315 04/19/20 0053  NA 139 139 141 139 144 148*  K 4.7 3.4* 3.3* 3.8  --   --   CL 102  --   --  105  --   --   CO2 26  --   --  23  --   --   GLUCOSE 166*  --   --  124*  --   --   BUN 12  --   --  12  --   --   CREATININE 0.69  --   --  0.73  --   --   CALCIUM 9.4  --   --  8.6*  --   --   MG  --   --   --  1.7  --    --   PHOS  --   --   --  2.9  --   --    GFR: Estimated Creatinine Clearance: 33.7 mL/min (by C-G formula based on SCr of 0.73 mg/dL). Recent Labs  Lab 04/20/2020 2239 04/18/20 0747  WBC 8.5 9.3    Liver Function Tests: Recent Labs  Lab 03/31/2020 2239  AST 39  ALT 21  ALKPHOS 107  BILITOT 0.6  PROT 7.8  ALBUMIN 3.5   Recent Labs  Lab 04/02/2020 2239  LIPASE 23   Recent Labs  Lab 04/21/2020 2240  AMMONIA 17    ABG    Component Value Date/Time  PHART 7.482 (H) 04/18/2020 0340   PCO2ART 35.9 04/18/2020 0340   PO2ART 109 (H) 04/18/2020 0340   HCO3 27.0 04/18/2020 0340   TCO2 28 04/18/2020 0340   O2SAT 99.0 04/18/2020 0340     Coagulation Profile: Recent Labs  Lab 04/21/2020 2239  INR 1.3*    Cardiac Enzymes: No results for input(s): CKTOTAL, CKMB, CKMBINDEX, TROPONINI in the last 168 hours.  HbA1C: Hgb A1c MFr Bld  Date/Time Value Ref Range Status  04/18/2020 02:16 AM 6.0 (H) 4.8 - 5.6 % Final    Comment:    (NOTE) Pre diabetes:          5.7%-6.4%  Diabetes:              >6.4%  Glycemic control for   <7.0% adults with diabetes   03/16/2008 06:35 PM  4.6 - 6.1 % Final   6.0 (NOTE)   The ADA recommends the following therapeutic goal for glycemic   control related to Hgb A1C measurement:   Goal of Therapy:   < 7.0% Hgb A1C   Reference: American Diabetes Association: Clinical Practice   Recommendations 2008, Diabetes Care,  2008, 31:(Suppl 1).    CBG: Recent Labs  Lab 04/18/20 1527 04/18/20 2002 04/18/20 2354 04/19/20 0324 04/19/20 0734  GLUCAP 95 125* 107* 118* 133*     Critical care time: 30 minutes     Posey Boyer, ACNP [facility] 04/19/2020, 8:09 AM

## 2020-04-19 NOTE — Progress Notes (Signed)
Daily Progress Note   Patient Name: Mary Chaney       Date: 04/19/2020 DOB: 12/13/1927  Age: 87 y.o. MRN#: [id number] Attending Physician: Candee Furbish, MD Primary Care Physician: Patient, No Pcp Per Admit Date: 04/01/2020  Reason for Consultation/Follow-up: To discuss complex medical decision making related to patient's goals of care  Discussed with ICU RN Aaron Edelman and Student RN Mohammed. Daughter shifted code status back to full code last evening.   Subjective: Met with patient at bedside with her two daughters.  They are in somewhat of a state of shock at what has happened.  We retreated to a conference room to talk.  The daughters let me know that their mother lived independently.  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  She is a spiritual woman [facility]), and has a wonderful flare for life.  She loves music and loves to dance.  Her daughter explains that she herself had had some health issues and the patient was beside her every step of the way.    Mary Chaney husband passed in 48.  Mary Chaney (daughter) lives locally and Mary Chaney lives in [place].  Mary Chaney has no other living siblings.     A significant portion of our meeting was spent allowing the daughters to express their feelings - currently they are feeling very pressured about making decisions for their mother.  Mary Chaney comments that she feels numb and is unable to make decisions.   Mary Chaney expresses some anger at being rushed.    The daughters want to give their mother time to heal.  They comment that they want everything done for her.    I committed that we would give them time to process and the PMT would be in touch with them intermittently.   Assessment: Patient intubated.  Left sided  extremities withdraw from pain.  She is unconscious and intubated.  On low dose fentanyl for comfort.  CV irreg irreg.   Patient Profile/HPI:  87 y.o. female  with past medical history of atrial fibrillation (on Xarelto), hypertension, chronic diastolic heart failure, and toxic multinodular goiter admitted on 04/15/2020 after being found unresponsive by family with last known normal on the evening of 04/16/2020. She was intubated for airway protection and head CT demonstrated large ICH with  significant mass effect of 21m.   Palliative care has been consulted to discuss goals of care with family as the patient is not a surgical candidate and this is felt to be non-survivable injury.    Length of Stay: 1   Vital Signs: BP 110/69   Pulse (!) 112   Temp 98 F (36.7 C) (Axillary)   Resp 20   Ht _0  (1.473 m)   Wt 55.2 kg   SpO2 100%   BMI 25.43 kg/m  SpO2: SpO2: 100 % O2 Device: O2 Device: Ventilator O2 Flow Rate: O2 Flow Rate (L/min): 15 L/min       Palliative Assessment/Data: 10%     Palliative Care Plan    Recommendations/Plan:  Family needs time to process.  They need a gentle bedside manner.    Appreciate Chaplain involvement and will request that they stay involved if possible.  Music put on for patient in room at family's request.  Appreciate excellent RN care patient is receiving  PMT will follow up daily if needed or at least QOD.  Code Status:  Full code  Prognosis:   Unable to determine.  Large intracranial hemorrhage in a 87yo female.  Unfortunately her opportunity for any meaningful functional recovery is very poor.   Discharge Planning:  To Be Determined  Care plan was discussed with RN, family  Thank you for allowing the Palliative Medicine Team to assist in the care of this patient.  Total time spent:  65 min.     Greater than 50%  of this time was spent counseling and coordinating care related to the above assessment and  plan.  MFlorentina Jenny PA-C Palliative Medicine  Please contact Palliative MedicineTeam phone at [phone] questions and concerns between 7 am - 7 pm.   Please see AMION for individual provider pager numbers.

## 2020-04-19 NOTE — Progress Notes (Signed)
eLink Physician-Brief Progress Note Patient Name: Mary Chaney DOB: 1928-02-16 MRN: [id number]   Date of Service  04/19/2020  HPI/Events of Note  Patient has a BP control parameter of < 140 mmHg, PRN Labetalol is not successfully controlling the BP below 140 mmHg.  eICU Interventions  Cardene infusion ordered.        Thomasene Lot Tonni Mansour 04/19/2020, 11:40 PM

## 2020-04-19 NOTE — Progress Notes (Signed)
eLink Physician-Brief Progress Note Patient Name: Mary Chaney DOB: April 23, 1927 MRN: [id number]   Date of Service  04/19/2020  HPI/Events of Note  Notified of rapid atrial fibrillation eith HR in the 140s.  BP 125/86.   eICU Interventions  Give lopressors 5mg  IV for rate control.     Intervention Category Intermediate Interventions: Arrhythmia - evaluation and management  04/19/2020, 4:20 AM

## 2020-04-19 NOTE — Progress Notes (Addendum)
STROKE TEAM PROGRESS NOTE   SUBJECTIVE (INTERVAL HISTORY) Her daughters are at the bedside. I had a long discussion with the daughters at bedside.  I explained that the patient has a large intracranial hemorrhage.  It is unlikely that she would have a meaningful functional recovery.  She may need prolonged vent support, possible trach and PEG, and prolonged rehab. The family will need a few days to process and decide on goals of care.    Pt unresponsive, intubated, able to move left upper extremity with pain stimulation.  Bilateral pupil blown and fixed.  Still has corneal and gag reflexes.  CT head showed extensive left hemisphere large ICH with midline shift, unsurvivable.  ICH score 4.   OBJECTIVE Temp:  [99.5 F (37.5 C)-100.8 F (38.2 C)] 99.6 F (37.6 C) (02/22 2357) Pulse Rate:  [25-145] 87 (02/23 0500) Resp:  [7-21] 20 (02/23 0500) BP: (104-188)/(60-93) 104/64 (02/23 0500) SpO2:  [96 %-100 %] 100 % (02/23 0500) FiO2 (%):  [40 %] 40 % (02/23 0326) Weight:  [55.2 kg] 55.2 kg (02/23 0259)  Recent Labs  Lab 04/18/20 1115 04/18/20 1527 04/18/20 2002 04/18/20 2354 04/19/20 0324  GLUCAP 102* 95 125* 107* 118*   Recent Labs  Lab 04/24/2020 2239 04/18/20 0002 04/18/20 0340 04/18/20 0849 04/18/20 1315 04/19/20 0053  NA 139 139 141 139 144 148*  K 4.7 3.4* 3.3* 3.8  --   --   CL 102  --   --  105  --   --   CO2 26  --   --  23  --   --   GLUCOSE 166*  --   --  124*  --   --   BUN 12  --   --  12  --   --   CREATININE 0.69  --   --  0.73  --   --   CALCIUM 9.4  --   --  8.6*  --   --   MG  --   --   --  1.7  --   --   PHOS  --   --   --  2.9  --   --    Recent Labs  Lab 04/16/2020 2239  AST 39  ALT 21  ALKPHOS 107  BILITOT 0.6  PROT 7.8  ALBUMIN 3.5   Recent Labs  Lab 04/13/2020 2239 04/18/20 0002 04/18/20 0340 04/18/20 0747  WBC 8.5  --   --  9.3  NEUTROABS 7.5  --   --   --   HGB 14.1 12.9 13.6 12.4  HCT 45.4 38.0 40.0 38.5  MCV 91.0  --   --  89.7  PLT 202   --   --  172   No results for input(s): CKTOTAL, CKMB, CKMBINDEX, TROPONINI in the last 168 hours. Recent Labs    04/12/2020 2239  LABPROT 15.4*  INR 1.3*   Recent Labs    04/18/20 0100  COLORURINE YELLOW  LABSPEC 1.040*  PHURINE 6.0  GLUCOSEU NEGATIVE  HGBUR NEGATIVE  BILIRUBINUR NEGATIVE  KETONESUR 5*  PROTEINUR 30*  NITRITE NEGATIVE  LEUKOCYTESUR NEGATIVE       Component Value Date/Time   CHOL 113 04/18/2020 0849   TRIG 30 04/18/2020 0849   HDL 64 04/18/2020 0849   CHOLHDL 1.8 04/18/2020 0849   VLDL 6 04/18/2020 0849   LDLCALC 43 04/18/2020 0849   Lab Results  Component Value Date   HGBA1C 6.0 (H) 04/18/2020  Component Value Date/Time   LABOPIA NONE DETECTED 03/17/2008 0431   COCAINSCRNUR NONE DETECTED 03/17/2008 0431   LABBENZ NONE DETECTED 03/17/2008 0431   AMPHETMU NONE DETECTED 03/17/2008 0431   THCU NONE DETECTED 03/17/2008 0431   LABBARB  03/17/2008 0431    NONE DETECTED        DRUG SCREEN FOR MEDICAL PURPOSES ONLY.  IF CONFIRMATION IS NEEDED FOR ANY PURPOSE, NOTIFY LAB WITHIN 5 DAYS.        LOWEST DETECTABLE LIMITS FOR URINE DRUG SCREEN Drug Class       Cutoff (ng/mL) Amphetamine      1000 Barbiturate      200 Benzodiazepine   200 Tricyclics       300 Opiates          300 Cocaine          300 THC              50    No results for input(s): ETH in the last 168 hours.  04/18/2020 IMPRESSION: CT head:  1. Intracerebral hemorrhage within the left frontoparietal lobes measuring up to 8 x 5.6 x 5.8 cm in size. Extensive surrounding hypoattenuating edema. Significant mass effect with global sulcal effacement of the left cerebral hemisphere, 18 mm of left to right midline shift, medialization of the left uncus and indentation of the contralateral right cerebral crus against the right tentorium cerebelli. 2. Background of diffuse parenchymal volume loss and microvascular angiopathy with intracranial atherosclerosis. 3. Extensive sites of  scalp swelling versus edema. Correlate for tenderness. Findings most pronounced in the right temporal and left parieto-occipital regions.  CT cervical spine:  1. No acute fracture or traumatic listhesis of the cervical spine. 2. Diffuse edematous changes of the posterior cervical musculature, left greater than right. Possibly related to more diffuse anasarca changes elsewhere. 3. Multilevel cervical spondylitic changes as detailed above.  CT chest, abdomen and pelvis  1. Endotracheal tube tip terminates low in the trachea, 1 cm from the carina. Recommend retraction 2 cm to the mid trachea. 2. Appropriate positioning of the endotracheal tube. 3. Features could suggest underlying CHF/heart failure with cardiomegaly small bilateral effusions and pulmonary vascular congestion. Additional diffuse features of anasarca with circumferential body wall edema. Heterogeneous enhancement of the liver, may reflect some underlying hepatic steatosis or altered hepatic perfusion dynamics or hepatic congestion in the setting of heart failure. 4. Mixed dependent and passive atelectatic changes in the lung bases. 5. Massively enlarged, heterogeneous and multinodular thyroid gland, with extension of the thyroid gland into the thoracic inlet. 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 Similar appearance to comparison exam and in the setting of significant comorbidities or limited life expectancy, no follow-up recommended (ref: J Am Coll Radiol. 2015 Feb;12(2): 143-50). 6. Redemonstration of a solid nodule in the superior segment left lower lobe this measures 12 x 14 x 18 mm, previously 8 x 11 x 9 mm reflecting interval growth however the attenuation of this structure closely matches that of simple fluid. Overall indeterminate though malignancy not fully excluded. Consider continued follow-up imaging if felt to be clinically warranted. 7. Fat and bowel containing left groin hernia  without evidence of mechanical obstruction or strangulation at this level. 8. Additional fat containing upper abdominal ventral hernia with fatty stranding and trace free fluid concerning for fat strangulation. 9. Diffusely edematous parents of both large and small bowel, could correlate for clinical features of an enterocolitis, either infectious or inflammatory.    PHYSICAL EXAM  Temp:  [  99.5 F (37.5 C)-100.8 F (38.2 C)] 99.6 F (37.6 C) (02/22 2357) Pulse Rate:  [25-145] 87 (02/23 0500) Resp:  [7-21] 20 (02/23 0500) BP: (104-188)/(60-93) 104/64 (02/23 0500) SpO2:  [96 %-100 %] 100 % (02/23 0500) FiO2 (%):  [40 %] 40 % (02/23 0326) Weight:  [55.2 kg] 55.2 kg (02/23 0259)  General - Well nourished, well developed, intubated on fentanyl.  Ophthalmologic - fundi not visualized due to noncooperation.  Cardiovascular - irregularly irregular heart rate and rhythm.  Neuro - intubated on sedation, eyes closed, not following commands. With forced eye opening, eyes in mid position, not blinking to visual threat, doll's eyes absent, not tracking, bilateral pupil 8 mm, fixed. Corneal reflex weak bilaterally, gag and cough present. Breathing not over the vent with RR setting 20.  Facial symmetry not able to test due to ET tube.  Tongue protrusion not cooperative. On pain stimulation, left upper extremity withdraw, slight withdraw left lower extremity, flicker movement of right lower extremity on pain stimulation. DTR diminished and no babinski. Sensation, coordination and gait not tested.   ASSESSMENT/PLAN Mary Chaney is a 87 y.o. female with history of A. fib on Xarelto, hypertension, goiter admitted for unresponsiveness. No tPA given due to ICH.    ICH: Large left ICH with significant midline shift, likely due to hypertension in the setting of Xarelto use  CT head showed large left ICH with significant midline shift, small IVH  Repeat CT head on hold given no change in  management  SCDs for VTE prophylaxis  Xarelto (rivaroxaban) daily prior to admission, now on No antithrombotic.  Reversed with Kcentra.  Disposition: Pending  Code status - full code - GOC discussion with family in the yesterday and today.  Family will like more time to process before making decisions.   Palliative care consult  Cerebral edema  CT shows significant midline shift  Unsurvivable  ICH score 4  Off 3% NaCl, Na level 139->144->148   HTN  Currently off Cleviprex  BP goal less than 140  BP range 99 - 110s today  Atrial Fibrillation  Holding Xarelto due to ICH  HR range today 57-112   Other Stroke Risk Factors  Advanced age  Other Active Problems  Goiter  Hospital day # 1    ATTENDING NOTE: I reviewed above note and agree with the assessment and plan. Pt was seen and examined.   Pt daughters are at the bedside. They had a lot of questions and I had long discussion with them at bedside, updated pt current condition, treatment plan and potential prognosis, and answered all the questions. They need time to process the information and discuss about pt GOC. Currently still full code.   Pt neurologically seems slightly worse than yesterday, currently intubated, on vent, not breathing over the vent with RR setting at 20. Not responsive, not open eyes, pupil balloon, fixed, corneal weak on the left, absent on the right, gag reflex present, with painful stimuli, left UE slightly against gravity, BLE slight withdraw, no movement of RUE. Less movement than yesterday.   Palliative care on board. Pt had unsurvivable stroke, with her advanced again, mental status, ICH score 4, we recommend comfort care measures. Family is still thinking and discussing.    For detailed assessment and plan, please refer to above as I have made changes wherever appropriate.   Marvel PlanJindong Xu, MD PhD Stroke Neurology 04/19/2020 5:58 PM  This patient is critically ill due to large left  brain ICH  with brain herniation and at significant risk of neurological worsening, death form brain death brain herniation. This patient's care requires constant monitoring of vital signs, hemodynamics, respiratory and cardiac monitoring, review of multiple databases, neurological assessment, discussion with family, other specialists and medical decision making of high complexity. I spent 50 minutes of neurocritical care time in the care of this patient.    To contact Stroke Continuity provider, please refer to [email or web address]. After hours, contact General Neurology

## 2020-04-19 NOTE — Plan of Care (Signed)
  Problem: Education: Goal: Knowledge of General Education information will improve Description: Including pain rating scale, medication(s)/side effects and non-pharmacologic comfort measures Outcome: Progressing   Problem: Health Behavior/Discharge Planning: Goal: Ability to manage health-related needs will improve Outcome: Progressing   Problem: Clinical Measurements: Goal: Ability to maintain clinical measurements within normal limits will improve Outcome: Progressing Goal: Will remain free from infection Outcome: Progressing Goal: Diagnostic test results will improve Outcome: Progressing Goal: Respiratory complications will improve Outcome: Progressing Goal: Cardiovascular complication will be avoided Outcome: Progressing   Problem: Activity: Goal: Risk for activity intolerance will decrease Outcome: Progressing   Problem: Nutrition: Goal: Adequate nutrition will be maintained Outcome: Progressing   Problem: Coping: Goal: Level of anxiety will decrease Outcome: Progressing   Problem: Elimination: Goal: Will not experience complications related to bowel motility Outcome: Progressing Goal: Will not experience complications related to urinary retention Outcome: Progressing   Problem: Pain Managment: Goal: General experience of comfort will improve Outcome: Progressing   Problem: Safety: Goal: Ability to remain free from injury will improve Outcome: Progressing   Problem: Skin Integrity: Goal: Risk for impaired skin integrity will decrease Outcome: Progressing   Problem: Education: Goal: Knowledge of disease or condition will improve Outcome: Progressing Goal: Knowledge of secondary prevention will improve Outcome: Progressing Goal: Knowledge of patient specific risk factors addressed and post discharge goals established will improve Outcome: Progressing   Problem: Coping: Goal: Will verbalize positive feelings about self Outcome: Progressing   Problem:  Health Behavior/Discharge Planning: Goal: Ability to manage health-related needs will improve Outcome: Progressing   Problem: Self-Care: Goal: Ability to participate in self-care as condition permits will improve Outcome: Progressing

## 2020-04-19 NOTE — Progress Notes (Signed)
PRN Labetalol ordered by Pola Corn MD, to call back if BP doesn't respond.

## 2020-04-20 DIAGNOSIS — I629 Nontraumatic intracranial hemorrhage, unspecified: Secondary | ICD-10-CM | POA: Diagnosis not present

## 2020-04-20 DIAGNOSIS — G935 Compression of brain: Secondary | ICD-10-CM | POA: Diagnosis not present

## 2020-04-20 DIAGNOSIS — I610 Nontraumatic intracerebral hemorrhage in hemisphere, subcortical: Secondary | ICD-10-CM | POA: Diagnosis not present

## 2020-04-20 DIAGNOSIS — R4189 Other symptoms and signs involving cognitive functions and awareness: Secondary | ICD-10-CM | POA: Diagnosis not present

## 2020-04-20 DIAGNOSIS — Z515 Encounter for palliative care: Secondary | ICD-10-CM | POA: Diagnosis not present

## 2020-04-20 LAB — GLUCOSE, CAPILLARY
Glucose-Capillary: 102 mg/dL — ABNORMAL HIGH (ref 70–99)
Glucose-Capillary: 118 mg/dL — ABNORMAL HIGH (ref 70–99)
Glucose-Capillary: 131 mg/dL — ABNORMAL HIGH (ref 70–99)
Glucose-Capillary: 128 mg/dL — ABNORMAL HIGH (ref 70–99)
Glucose-Capillary: 118 mg/dL — ABNORMAL HIGH (ref 70–99)

## 2020-04-20 LAB — CBC
HCT: 39.4 % (ref 36.0–46.0)
Hemoglobin: 12.8 g/dL (ref 12.0–15.0)
MCH: 29.6 pg (ref 26.0–34.0)
MCHC: 32.5 g/dL (ref 30.0–36.0)
MCV: 91 fL (ref 80.0–100.0)
Platelets: 149 10*3/uL — ABNORMAL LOW (ref 150–400)
RBC: 4.33 MIL/uL (ref 3.87–5.11)
RDW: 15.9 % — ABNORMAL HIGH (ref 11.5–15.5)
WBC: 8.6 10*3/uL (ref 4.0–10.5)
nRBC: 0 % (ref 0.0–0.2)

## 2020-04-20 LAB — BASIC METABOLIC PANEL
Anion gap: 12 (ref 5–15)
BUN: 20 mg/dL (ref 8–23)
CO2: 24 mmol/L (ref 22–32)
Calcium: 9.2 mg/dL (ref 8.9–10.3)
Chloride: 115 mmol/L — ABNORMAL HIGH (ref 98–111)
Creatinine, Ser: 0.7 mg/dL (ref 0.44–1.00)
GFR, Estimated: >60 mL/min (ref >60)
Glucose, Bld: 126 mg/dL — ABNORMAL HIGH (ref 70–99)
Potassium: 3.6 mmol/L (ref 3.5–5.1)
Sodium: 151 mmol/L — ABNORMAL HIGH (ref 135–145)

## 2020-04-20 MED ORDER — FENTANYL CITRATE (PF) 100 MCG/2ML IJ SOLN: 25.0000 ug | INTRAMUSCULAR | Status: DC | PRN

## 2020-04-20 MED ORDER — OLANZAPINE 2.5 MG PO TABS
2.5000 mg | ORAL_TABLET | Freq: Every day | ORAL | Status: DC
  Administered 2020-04-20 – 2020-04-21 (×2): 2.5 mg via ORAL
  Filled 2020-04-20 (×3): qty 1

## 2020-04-20 NOTE — Progress Notes (Signed)
Daily Progress Note   Patient Name: Mary Chaney       Date: 04/20/2020 DOB: 1927/04/12  Age: 87 y.o. MRN#: [id number] Attending Physician: Mary Furbish, MD Primary Care Physician: Patient, No Pcp Per Admit Date: 04/14/2020  Reason for Consultation/Follow-up: To discuss complex medical decision making related to patient's goals of care  Mary Chaney, Chaplain, Mary Cooler, PA-C and myself met at bedside with patient's two daughters Mary Chaney and Mary Chaney.  Meeting started at 1:40 and I left the meeting at 4:05 pm.    Subjective:  The meeting was opened with prayer.  Open ended questions were asked and the daughters generously expressed their many mixed emotions of love and admiration for their mother along with feelings of guilt for not have being able to prevent this tragedy.  Much listening and reassurance was provided.  This PA thoroughly enjoyed listening to stories of a very strong, independent, determined woman who instilled solid values of education, self sufficiency and love into her daughters.  Mary Chaney independence and dignity were of the utmost importance to her.  She was always dressed to a T, stayed very active, and never wanted to be a burden to anyone.  She did not like to go to the doctor's office and hated hospitals.  She sounds like quite a role model.  The daughters expressed that they have been so overwhelmed they have a hard time hearing and comprehending the medical facts being told to them.   In an attempt to be as clear and kind as possible, I explained that Mary Chaney will never walk, talk, eat, or speak again.  The daughters asked if they will have to make a decision about the ventilator.  I explained that decision is more of choosing between returning her body to  its natural state and allowing her to pass vs place a small hole in her neck and stomach for tubes that would attach her to machines that would provide artificial nutrition and ventilation until she eventually passed away.  This shook the daughters and there were many tears.    Hard choices books were provided and I encouraged the daughters to read them as the book will prepare them for each of the difficult decisions they are about to be asked to make.    We made arrangements to  meet again tomorrow along with the sons in-law.   Assessment: Patient with non-survivable ICH, Intubated and on cardene gtt due to hypertension.  Daughters struggling with [place].   Patient Profile/HPI:  87 y.o.femalewith past medical history of atrial fibrillation (on Xarelto), hypertension,chronic diastolic heart failure, andtoxic multinodular goiteradmitted on 2/21/2022after being found unresponsive by family with last known normal on the evening of 04/16/2020. She was intubated for airway protection and head CT demonstratedlarge ICH with significant mass effectof65m.   Palliative care has been consulted to discuss goals of care with family as the patient is not a surgical candidate and this is felt to be non-survivable injury.    Length of Stay: 2   Vital Signs: BP 121/75   Pulse 94   Temp 100 F (37.8 C) (Oral)   Resp (!) 21   Ht 4' 10"  (1.473 m)   Wt 56.2 kg   SpO2 100%   BMI 25.89 kg/m  SpO2: SpO2: 100 % O2 Device: O2 Device: Ventilator O2 Flow Rate: O2 Flow Rate (L/min): 15 L/min       Palliative Assessment/Data: 10%     Palliative Care Plan    Recommendations/Plan:  Mary Sealis returning to [place] Saturday to care for a few issues at home and then she will return. I don't believe decisions will be made regarding comfort [place]  Daughters are working to understand the reality of what has happened to their mother and they are making  progress.  They are not at a place to make decisions regarding her care today.  Given the severity of Mary Chaney's ICH (Non-survivable), when the daughters are ready to hear a recommendation for comfort Palliative will discuss it with them.  PMT will continue to support Mrs. BBuhrmanand her family along with you.  Code Status:  Full code  Prognosis:   Unable to determine.  Patient is unable to survive without life support and will likely only live at best weeks on life support.   Discharge Planning:  To Be Determined.  Unfortunately a hospital death would not be unexpected.  Care plan was discussed with family, ICU RN, Mary Chaney PMT Team and Chaplain  Thank you for allowing the Palliative Medicine Team to assist in the care of this patient.  Total time spent:  145 min.     Greater than 50%  of this time was spent counseling and coordinating care related to the above assessment and plan.  MFlorentina Jenny PA-C Palliative Medicine  Please contact Palliative MedicineTeam phone at [phone] questions and concerns between 7 am - 7 pm.   Please see AMION for individual provider pager numbers.

## 2020-04-20 NOTE — Progress Notes (Signed)
STROKE TEAM PROGRESS NOTE   SUBJECTIVE (INTERVAL HISTORY) Two daughters are at the bedside. Pt still intubated on vent, able to trigger vent today, however, neuro condition declined, with minimal movement of LUE and no corneals, as well as very weak gag reflex. I had long discussion with daughters at bedside, updated pt current condition, and again grave prognosis, and answered all the questions.    OBJECTIVE Temp:  [98.2 F (36.8 C)-100.1 F (37.8 C)] 100.1 F (37.8 C) (02/24 1139) Pulse Rate:  [49-116] 92 (02/24 1100) Cardiac Rhythm: Atrial fibrillation (02/24 0800) Resp:  [13-24] 19 (02/24 1100) BP: (99-174)/(56-105) 115/61 (02/24 1100) SpO2:  [99 %-100 %] 100 % (02/24 1100) FiO2 (%):  [40 %] 40 % (02/24 0741) Weight:  [56.2 kg] 56.2 kg (02/24 0438)  Recent Labs  Lab 04/19/20 2015 04/19/20 2315 04/20/20 0342 04/20/20 0757 04/20/20 1136  GLUCAP 95 88 102* 118* 131*   Recent Labs  Lab 04/11/2020 2239 04/18/20 0002 04/18/20 0340 04/18/20 0849 04/18/20 1315 04/19/20 0053 04/20/20 0059  NA 139 139 141 139 144 148* 151*  K 4.7 3.4* 3.3* 3.8  --   --  3.6  CL 102  --   --  105  --   --  115*  CO2 26  --   --  23  --   --  24  GLUCOSE 166*  --   --  124*  --   --  126*  BUN 12  --   --  12  --   --  20  CREATININE 0.69  --   --  0.73  --   --  0.70  CALCIUM 9.4  --   --  8.6*  --   --  9.2  MG  --   --   --  1.7  --   --   --   PHOS  --   --   --  2.9  --   --   --    Recent Labs  Lab 03/28/2020 2239  AST 39  ALT 21  ALKPHOS 107  BILITOT 0.6  PROT 7.8  ALBUMIN 3.5   Recent Labs  Lab 04/13/2020 2239 04/18/20 0002 04/18/20 0340 04/18/20 0747 04/20/20 0059  WBC 8.5  --   --  9.3 8.6  NEUTROABS 7.5  --   --   --   --   HGB 14.1 12.9 13.6 12.4 12.8  HCT 45.4 38.0 40.0 38.5 39.4  MCV 91.0  --   --  89.7 91.0  PLT 202  --   --  172 149*   No results for input(s): CKTOTAL, CKMB, CKMBINDEX, TROPONINI in the last 168 hours. Recent Labs    04/14/2020 2239  LABPROT  15.4*  INR 1.3*   Recent Labs    04/18/20 0100  COLORURINE YELLOW  LABSPEC 1.040*  PHURINE 6.0  GLUCOSEU NEGATIVE  HGBUR NEGATIVE  BILIRUBINUR NEGATIVE  KETONESUR 5*  PROTEINUR 30*  NITRITE NEGATIVE  LEUKOCYTESUR NEGATIVE       Component Value Date/Time   CHOL 113 04/18/2020 0849   TRIG 30 04/18/2020 0849   HDL 64 04/18/2020 0849   CHOLHDL 1.8 04/18/2020 0849   VLDL 6 04/18/2020 0849   LDLCALC 43 04/18/2020 0849   Lab Results  Component Value Date   HGBA1C 6.0 (H) 04/18/2020      Component Value Date/Time   LABOPIA NONE DETECTED 03/17/2008 0431   COCAINSCRNUR NONE DETECTED 03/17/2008 0431   LABBENZ NONE DETECTED  03/17/2008 0431   AMPHETMU NONE DETECTED 03/17/2008 0431   THCU NONE DETECTED 03/17/2008 0431   LABBARB  03/17/2008 0431    NONE DETECTED        DRUG SCREEN FOR MEDICAL PURPOSES ONLY.  IF CONFIRMATION IS NEEDED FOR ANY PURPOSE, NOTIFY LAB WITHIN 5 DAYS.        LOWEST DETECTABLE LIMITS FOR URINE DRUG SCREEN Drug Class       Cutoff (ng/mL) Amphetamine      1000 Barbiturate      200 Benzodiazepine   200 Tricyclics       300 Opiates          300 Cocaine          300 THC              50    No results for input(s): ETH in the last 168 hours.  04/18/2020 IMPRESSION: CT head:  1. Intracerebral hemorrhage within the left frontoparietal lobes measuring up to 8 x 5.6 x 5.8 cm in size. Extensive surrounding hypoattenuating edema. Significant mass effect with global sulcal effacement of the left cerebral hemisphere, 18 mm of left to right midline shift, medialization of the left uncus and indentation of the contralateral right cerebral crus against the right tentorium cerebelli. 2. Background of diffuse parenchymal volume loss and microvascular angiopathy with intracranial atherosclerosis. 3. Extensive sites of scalp swelling versus edema. Correlate for tenderness. Findings most pronounced in the right temporal and left parieto-occipital  regions.  CT cervical spine:  1. No acute fracture or traumatic listhesis of the cervical spine. 2. Diffuse edematous changes of the posterior cervical musculature, left greater than right. Possibly related to more diffuse anasarca changes elsewhere. 3. Multilevel cervical spondylitic changes as detailed above.  CT chest, abdomen and pelvis  1. Endotracheal tube tip terminates low in the trachea, 1 cm from the carina. Recommend retraction 2 cm to the mid trachea. 2. Appropriate positioning of the endotracheal tube. 3. Features could suggest underlying CHF/heart failure with cardiomegaly small bilateral effusions and pulmonary vascular congestion. Additional diffuse features of anasarca with circumferential body wall edema. Heterogeneous enhancement of the liver, may reflect some underlying hepatic steatosis or altered hepatic perfusion dynamics or hepatic congestion in the setting of heart failure. 4. Mixed dependent and passive atelectatic changes in the lung bases. 5. Massively enlarged, heterogeneous and multinodular thyroid gland, with extension of the thyroid gland into the thoracic inlet. 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 Similar appearance to comparison exam and in the setting of significant comorbidities or limited life expectancy, no follow-up recommended (ref: J Am Coll Radiol. 2015 Feb;12(2): 143-50). 6. Redemonstration of a solid nodule in the superior segment left lower lobe this measures 12 x 14 x 18 mm, previously 8 x 11 x 9 mm reflecting interval growth however the attenuation of this structure closely matches that of simple fluid. Overall indeterminate though malignancy not fully excluded. Consider continued follow-up imaging if felt to be clinically warranted. 7. Fat and bowel containing left groin hernia without evidence of mechanical obstruction or strangulation at this level. 8. Additional fat containing upper abdominal ventral hernia  with fatty stranding and trace free fluid concerning for fat strangulation. 9. Diffusely edematous parents of both large and small bowel, could correlate for clinical features of an enterocolitis, either infectious or inflammatory.    PHYSICAL EXAM   Temp:  [98.2 F (36.8 C)-100.1 F (37.8 C)] 100.1 F (37.8 C) (02/24 1139) Pulse Rate:  [49-116] 92 (02/24 1100)  Resp:  [13-24] 19 (02/24 1100) BP: (99-174)/(56-105) 115/61 (02/24 1100) SpO2:  [99 %-100 %] 100 % (02/24 1100) FiO2 (%):  [40 %] 40 % (02/24 0741) Weight:  [56.2 kg] 56.2 kg (02/24 0438)  General - Well nourished, well developed, intubated off sedation.  Ophthalmologic - fundi not visualized due to noncooperation.  Cardiovascular - irregularly irregular heart rate and rhythm.  Neuro - intubated off sedation, eyes closed, not following commands. With forced eye opening, eyes in mid position, not blinking to visual threat, doll's eyes absent, not tracking, bilateral pupil 8 mm, fixed. Corneal reflex absent, gag and cough weakly present. Breathing over the vent.  Facial symmetry not able to test due to ET tube.  Tongue protrusion not cooperative. On pain stimulation, left upper extremity minimal withdraw, no movement of bilateral lower extremities. DTR diminished and no babinski. Sensation, coordination and gait not tested.   ASSESSMENT/PLAN Ms. Mary Chaney is a 87 y.o. female with history of A. fib on Xarelto, hypertension, goiter admitted for unresponsiveness. No tPA given due to ICH.    ICH: Large left ICH with significant midline shift, likely due to hypertension in the setting of Xarelto use  CT head showed large left ICH with significant midline shift, small IVH  Repeat CT head on hold given no change in management  SCDs for VTE prophylaxis  Xarelto (rivaroxaban) daily prior to admission, now on No antithrombotic.  Reversed with Kcentra.  Disposition: Pending  Code status - full code - GOC discussion with  family in the yesterday and today.  Family will like more time to process before making decisions. Clearly, pt neuro status is declining.   Cerebral edema  CT shows significant midline shift  Unsurvivable  ICH score 4  Off 3% NaCl, Na level 139->144->148->151   Goal Na 150-155  HTN  BP goal less than 140  On cardene now  BP range 99 - 110s today  Atrial Fibrillation  Holding Xarelto due to ICH  HR range today 57-112   Other Stroke Risk Factors  Advanced age  Other Active Problems  Goiter   Intubated  Hospital day # 2  This patient is critically ill due to large ICH, brain herniation and at significant risk of neurological worsening, death form brain herniation and brain death. This patient's care requires constant monitoring of vital signs, hemodynamics, respiratory and cardiac monitoring, review of multiple databases, neurological assessment, discussion with family, other specialists and medical decision making of high complexity. I spent 40 minutes of neurocritical care time in the care of this patient.  I had long discussion with daughters at bedside, updated pt current condition, and again grave prognosis, and answered all the questions.     To contact Stroke Continuity provider, please refer to [email or web address]. After hours, contact General Neurology

## 2020-04-20 NOTE — Progress Notes (Signed)
NAME:  Mary Chaney, MRN:  [id number], DOB:  [birth year], LOS: 2 ADMISSION DATE:  04/18/2020, CONSULTATION DATE:  04/20/20 REFERRING MD:  EDP, CHIEF COMPLAINT:  unresponsive   Brief History:  87 y.o. F with PMH of Atrial Fibrillation on Xarelto, HTN, multi-nodular goiter who was found unresponsive by family and has large fronto-parietal ICH with mass effect and global effacement.  Intubated and PCCM consulted for admission.  History of Present Illness:  Mary Chaney is a 87 y.o. F with PMH of Atrial Fibrillation on Xarelto, HTN, multi-nodular goiter who was found unresponsive by family, last known well around 7pm.  Pt was breathing, but unresponsive on EMS arrival and was intubated in the ED.  ED work-up significant for [facility] showing Intracerebral hemorrhage within the left frontoparietal lobes with extensive surrounding hypoattenuating edema and significant mass effect with global sulcal effacement. Labs grossly unremarkable, INR 1.3.   CXR with possible developing LLL infiltrate.  Per neurosurgery, pt is not a surgical candidate.  She was given Kcentra and PCCM consulted for admission  Past Medical History:   has a past medical history of Atrial fibrillation (HCC), Hemorrhoids, Hypertension, and Toxic multinodular goiter.  Significant Hospital Events:  2/22 Admit to PCCM  Consults:  Neurosurgery Neurology  Procedures:  2/22 ETT  Significant Diagnostic Tests:  2/22 CT head/abdomen/pelvis>>Intracerebral hemorrhage within the left frontoparietal lobes measuring up to 8 x 5.6 x 5.8 cm in size. Extensive surrounding hypoattenuating edema. Significant mass effect with global sulcal effacement of the left cerebral hemisphere, 18 mm of left to right midline shift, medialization of the left uncus and indentation of the contralateral right cerebral crus against the right tentorium cerebelli.  Micro Data:  2/22 BCx2>> no growth at 2 days   Antimicrobials:  na  Interim History / Subjective:   Cardene added overnight for HTN   I placed on PSV  -- pt RR 45/min with normal Vt Placed back on PRVC   Off sedation  Objective   Blood pressure (!) 108/59, pulse 98, temperature 98.6 F (37 C), temperature source Axillary, resp. rate 17, height 4\' 10"  (1.473 m), weight 56.2 kg, SpO2 99 %.    Vent Mode: PRVC FiO2 (%):  [40 %] 40 % Set Rate:  [20 bmp] 20 bmp Vt Set:  [330 mL] 330 mL PEEP:  [5 cmH20] 5 cmH20 Plateau Pressure:  [16 cmH20] 16 cmH20   Intake/Output Summary (Last 24 hours) at 04/20/2020 0716 Last data filed at 04/20/2020 0700 Gross per 24 hour  Intake 952.11 ml  Output 451 ml  Net 501.11 ml   Filed Weights   04/18/20 0353 04/19/20 0259 04/20/20 0438  Weight: 57.4 kg 55.2 kg 56.2 kg   General:  Thin critically ill appearing elderly F, intubated supine in bed  HEENT: NCAT. Enlarged thyroid. ETT secure. Anicteric sclera  Neuro: Pupils 73mm fixed bilaterally. No corneal reflex. Does not awaken to noxious stimuli. Slight lower extremity extension to painful stimuli.  CV: rrr s1s2 cap refill < 3 seconds  PULM: Symmetrical chest expansion, mechanically ventilated, no wheeze, no crackles  GI: soft round nd  Extremities: chronic joint changes of bilateral great toes. No acute joint deformity, no cyanosis or clubbing  Skin: c/d/w no rash  Resolved Hospital Problem list     Assessment & Plan:   Large L fronto-parietal ICH with midline shift, global sulcal effacement, cerebral edema  -xarelto at home, got 4m in ED  P -neuro and nsgy have evaluated -not an operative candidate -s/p 3%  per neuro.  -repeat imaging without utility as it would not change prognosis  -this devastating ICH is not felt to be survivable by multiple care teams -- GOC are ongoing, I would DNR (CCM has discussed futility of ACLS in setting of ICH with family, remains full code)   Acute encephalopathy in setting of above -off sedation   Acute respiratory failure with poor airway protection,  in setting of intracranial process above P -cont MV support  -wean PEEP, FiO2 as able  -VAP, pulm hygiene  Atrial fibrillation on home xarelto  -given kcentra  HTN Rate controlled, last echo with preserved EF P: -hold home Digoxin and Metoprolol for now and resume as needed  Toxic multi-nodal goiter  -continue home Tapazole  L groin hernia  -small bowel loops protruding into hernia seen on CT p, without evidence of mechanical obstruction P -conservative management; GOC for devastating ICH above are ongoing  Best practice (evaluated daily)  Diet: NPO Pain/Anxiety/Delirium protocol (if indicated): n/a VAP protocol (if indicated): HOB 30 degrees, suction prn DVT prophylaxis: SCD's GI prophylaxis: protonix Glucose control: SSI Mobility: bed rest Disposition:ICU  Goals of Care:  Last date of multidisciplinary goals of care discussion: 2/22 Family and staff present: Daughter Clydie Braun Summary of discussion: full  Follow up goals of care discussion due: 2/29 Code Status: full code  Labs   CBC: Recent Labs  Lab 04/06/2020 2239 04/18/20 0002 04/18/20 0340 04/18/20 0747 04/20/20 0059  WBC 8.5  --   --  9.3 8.6  NEUTROABS 7.5  --   --   --   --   HGB 14.1 12.9 13.6 12.4 12.8  HCT 45.4 38.0 40.0 38.5 39.4  MCV 91.0  --   --  89.7 91.0  PLT 202  --   --  172 149*    Basic Metabolic Panel: Recent Labs  Lab 04/08/2020 2239 04/18/20 0002 04/18/20 0340 04/18/20 0849 04/18/20 1315 04/19/20 0053 04/20/20 0059  NA 139 139 141 139 144 148* 151*  K 4.7 3.4* 3.3* 3.8  --   --  3.6  CL 102  --   --  105  --   --  115*  CO2 26  --   --  23  --   --  24  GLUCOSE 166*  --   --  124*  --   --  126*  BUN 12  --   --  12  --   --  20  CREATININE 0.69  --   --  0.73  --   --  0.70  CALCIUM 9.4  --   --  8.6*  --   --  9.2  MG  --   --   --  1.7  --   --   --   PHOS  --   --   --  2.9  --   --   --    GFR: Estimated Creatinine Clearance: 34 mL/min (by C-G formula based on SCr of  0.7 mg/dL). Recent Labs  Lab 04/13/2020 2239 04/18/20 0747 04/20/20 0059  WBC 8.5 9.3 8.6    Liver Function Tests: Recent Labs  Lab 04/20/2020 2239  AST 39  ALT 21  ALKPHOS 107  BILITOT 0.6  PROT 7.8  ALBUMIN 3.5   Recent Labs  Lab 04/08/2020 2239  LIPASE 23   Recent Labs  Lab 04/12/2020 2240  AMMONIA 17    ABG    Component Value Date/Time   PHART 7.482 (H)  04/18/2020 0340   PCO2ART 35.9 04/18/2020 0340   PO2ART 109 (H) 04/18/2020 0340   HCO3 27.0 04/18/2020 0340   TCO2 28 04/18/2020 0340   O2SAT 99.0 04/18/2020 0340     Coagulation Profile: Recent Labs  Lab 05-02-2020 2239  INR 1.3*    Cardiac Enzymes: No results for input(s): CKTOTAL, CKMB, CKMBINDEX, TROPONINI in the last 168 hours.  HbA1C: Hgb A1c MFr Bld  Date/Time Value Ref Range Status  04/18/2020 02:16 AM 6.0 (H) 4.8 - 5.6 % Final    Comment:    (NOTE) Pre diabetes:          5.7%-6.4%  Diabetes:              >6.4%  Glycemic control for   <7.0% adults with diabetes   03/16/2008 06:35 PM  4.6 - 6.1 % Final   6.0 (NOTE)   The ADA recommends the following therapeutic goal for glycemic   control related to Hgb A1C measurement:   Goal of Therapy:   < 7.0% Hgb A1C   Reference: American Diabetes Association: Clinical Practice   Recommendations 2008, Diabetes Care,  2008, 31:(Suppl 1).    CBG: Recent Labs  Lab 04/19/20 1134 04/19/20 1548 04/19/20 2015 04/19/20 2315 04/20/20 0342  GLUCAP 76 103* 95 88 102*    CRITICAL CARE Performed by: Lanier Clam   Total critical care time: 40 minutes  Critical care time was exclusive of separately billable procedures and treating other patients. Critical care was necessary to treat or prevent imminent or life-threatening deterioration.  Critical care was time spent personally by me on the following activities: development of treatment plan with patient and/or surrogate as well as nursing, discussions with consultants, evaluation of patient's  response to treatment, examination of patient, obtaining history from patient or surrogate, ordering and performing treatments and interventions, ordering and review of laboratory studies, ordering and review of radiographic studies, pulse oximetry and re-evaluation of patient's condition.

## 2020-04-20 NOTE — Progress Notes (Signed)
This chaplain joined the PMT- Norvel Richards, PA and Richardson Dopp, [place] for a family meeting with the Pt. two daughters-Karen and Dannielle Karvonen.  The family accepted the chaplain invitation for prayer at the beginning of the meeting. The chaplain practiced reflective and empathic listening as the daughters shared stories of their mother with the team.  The team was available to answer the daughters' questions and respect the reflections of the Pt. strength and dignity.  The team planned time together on Friday for F/U with the daughters.  This chaplain will provide F/U spiritual care as needed.

## 2020-04-21 DIAGNOSIS — I610 Nontraumatic intracerebral hemorrhage in hemisphere, subcortical: Secondary | ICD-10-CM

## 2020-04-21 DIAGNOSIS — Z9911 Dependence on respirator [ventilator] status: Secondary | ICD-10-CM

## 2020-04-21 DIAGNOSIS — Z7189 Other specified counseling: Secondary | ICD-10-CM | POA: Diagnosis not present

## 2020-04-21 LAB — COMPREHENSIVE METABOLIC PANEL
ALT: 14 U/L (ref 0–44)
AST: 20 U/L (ref 15–41)
Albumin: 2.3 g/dL — ABNORMAL LOW (ref 3.5–5.0)
Alkaline Phosphatase: 79 U/L (ref 38–126)
Anion gap: 12 (ref 5–15)
BUN: 18 mg/dL (ref 8–23)
CO2: 24 mmol/L (ref 22–32)
Calcium: 9 mg/dL (ref 8.9–10.3)
Chloride: 117 mmol/L — ABNORMAL HIGH (ref 98–111)
Creatinine, Ser: 0.67 mg/dL (ref 0.44–1.00)
GFR, Estimated: >60 mL/min (ref >60)
Glucose, Bld: 119 mg/dL — ABNORMAL HIGH (ref 70–99)
Potassium: 3.8 mmol/L (ref 3.5–5.1)
Sodium: 153 mmol/L — ABNORMAL HIGH (ref 135–145)
Total Bilirubin: 1.2 mg/dL (ref 0.3–1.2)
Total Protein: 6.8 g/dL (ref 6.5–8.1)

## 2020-04-21 LAB — GLUCOSE, CAPILLARY
Glucose-Capillary: 106 mg/dL — ABNORMAL HIGH (ref 70–99)
Glucose-Capillary: 111 mg/dL — ABNORMAL HIGH (ref 70–99)
Glucose-Capillary: 59 mg/dL — ABNORMAL LOW (ref 70–99)
Glucose-Capillary: 62 mg/dL — ABNORMAL LOW (ref 70–99)
Glucose-Capillary: 63 mg/dL — ABNORMAL LOW (ref 70–99)
Glucose-Capillary: 69 mg/dL — ABNORMAL LOW (ref 70–99)
Glucose-Capillary: 70 mg/dL (ref 70–99)
Glucose-Capillary: 71 mg/dL (ref 70–99)
Glucose-Capillary: 91 mg/dL (ref 70–99)

## 2020-04-21 MED ORDER — BIOTENE DRY MOUTH MT LIQD: 15.0000 mL | OROMUCOSAL | Status: DC | PRN

## 2020-04-21 MED ORDER — POLYVINYL ALCOHOL 1.4 % OP SOLN
1.0000 [drp] | Freq: Four times a day (QID) | OPHTHALMIC | Status: DC | PRN
Start: 2020-04-21 — End: 2020-04-23
  Administered 2020-04-21: 1 [drp] via OPHTHALMIC
  Filled 2020-04-21: qty 15

## 2020-04-21 MED ORDER — HALOPERIDOL LACTATE 5 MG/ML IJ SOLN: 0.5000 mg | INTRAMUSCULAR | Status: DC | PRN

## 2020-04-21 MED ORDER — WHITE PETROLATUM EX OINT: TOPICAL_OINTMENT | CUTANEOUS | Status: DC | PRN

## 2020-04-21 MED ORDER — HYDROMORPHONE BOLUS VIA INFUSION
1.0000 mg | INTRAVENOUS | Status: DC | PRN
  Filled 2020-04-21: qty 1

## 2020-04-21 MED ORDER — DEXTROSE 50 % IV SOLN
12.5000 g | INTRAVENOUS | Status: AC
  Administered 2020-04-21: 12.5 g via INTRAVENOUS
  Filled 2020-04-21: qty 50

## 2020-04-21 MED ORDER — ACETAMINOPHEN 325 MG PO TABS: 650.0000 mg | ORAL_TABLET | Freq: Four times a day (QID) | ORAL | Status: DC | PRN

## 2020-04-21 MED ORDER — GLYCOPYRROLATE 0.2 MG/ML IJ SOLN: 0.2000 mg | INTRAMUSCULAR | Status: DC | PRN

## 2020-04-21 MED ORDER — LORAZEPAM 1 MG PO TABS: 1.0000 mg | ORAL_TABLET | ORAL | Status: DC | PRN

## 2020-04-21 MED ORDER — HALOPERIDOL 0.5 MG PO TABS
0.5000 mg | ORAL_TABLET | ORAL | Status: DC | PRN
  Filled 2020-04-21: qty 1

## 2020-04-21 MED ORDER — ONDANSETRON HCL 4 MG/2ML IJ SOLN: 4.0000 mg | Freq: Four times a day (QID) | INTRAMUSCULAR | Status: DC | PRN

## 2020-04-21 MED ORDER — LORAZEPAM 2 MG/ML PO CONC: 1.0000 mg | ORAL | Status: DC | PRN

## 2020-04-21 MED ORDER — ONDANSETRON 4 MG PO TBDP
4.0000 mg | ORAL_TABLET | Freq: Four times a day (QID) | ORAL | Status: DC | PRN
  Filled 2020-04-21: qty 1

## 2020-04-21 MED ORDER — SODIUM CHLORIDE 0.9 % IV SOLN
1.0000 mg/h | INTRAVENOUS | Status: DC
  Administered 2020-04-21: 1 mg/h via INTRAVENOUS
  Filled 2020-04-21 (×3): qty 2.5

## 2020-04-21 MED ORDER — GLYCOPYRROLATE 1 MG PO TABS
1.0000 mg | ORAL_TABLET | ORAL | Status: DC | PRN
  Filled 2020-04-21: qty 1

## 2020-04-21 MED ORDER — LORAZEPAM 2 MG/ML IJ SOLN: 1.0000 mg | INTRAMUSCULAR | Status: DC | PRN

## 2020-04-21 MED ORDER — HALOPERIDOL LACTATE 2 MG/ML PO CONC
0.5000 mg | ORAL | Status: DC | PRN
  Filled 2020-04-21: qty 0.3

## 2020-04-21 MED ORDER — LACTATED RINGERS IV SOLN: INTRAVENOUS | Status: DC

## 2020-04-21 MED ORDER — ACETAMINOPHEN 650 MG RE SUPP: 650.0000 mg | Freq: Four times a day (QID) | RECTAL | Status: DC | PRN

## 2020-04-21 MED ORDER — DEXTROSE 50 % IV SOLN
12.5000 g | INTRAVENOUS | Status: AC
  Administered 2020-04-21: 12.5 g via INTRAVENOUS

## 2020-04-21 NOTE — Progress Notes (Addendum)
NAME:  Mary Chaney, MRN:  [id number], DOB:  01-17-1928, LOS: 3 ADMISSION DATE:  05/01/2020, CONSULTATION DATE:  04/21/20 REFERRING MD:  EDP, CHIEF COMPLAINT:  unresponsive   Brief History:  87 y.o. F with PMH of Atrial Fibrillation on Xarelto, HTN, multi-nodular goiter who was found unresponsive by family and has large fronto-parietal ICH with mass effect and global effacement.  Intubated and PCCM consulted for admission.  History of Present Illness:  Mary Chaney is a 87 y.o. F with PMH of Atrial Fibrillation on Xarelto, HTN, multi-nodular goiter who was found unresponsive by family, last known well around 7pm.  Pt was breathing, but unresponsive on EMS arrival and was intubated in the ED.  ED work-up significant for [facility] showing Intracerebral hemorrhage within the left frontoparietal lobes with extensive surrounding hypoattenuating edema and significant mass effect with global sulcal effacement. Labs grossly unremarkable, INR 1.3.   CXR with possible developing LLL infiltrate.  Per neurosurgery, pt is not a surgical candidate.  She was given Kcentra and PCCM consulted for admission  Past Medical History:   has a past medical history of Atrial fibrillation (HCC), Hemorrhoids, Hypertension, and Toxic multinodular goiter.  Significant Hospital Events:  2/22 Admit to PCCM  Consults:  Neurosurgery Neurology Palliative Care Procedures:  2/22 ETT  Significant Diagnostic Tests:  2/22 CT head/abdomen/pelvis>>Intracerebral hemorrhage within the left frontoparietal lobes measuring up to 8 x 5.6 x 5.8 cm in size. Extensive surrounding hypoattenuating edema. Significant mass effect with global sulcal effacement of the left cerebral hemisphere, 18 mm of left to right midline shift, medialization of the left uncus and indentation of the contralateral right cerebral crus against the right tentorium cerebelli.  Micro Data:  2/22 BCx2>> no growth at 2 days   Antimicrobials:  na  Interim  History / Subjective:  Family had long meeting with Palliative Care 2/24  Tmax 101 overnight, isolated reading with subsequent and prior temp <100  Objective   Blood pressure 139/83, pulse (!) 117, temperature 100 F (37.8 C), temperature source Oral, resp. rate 20, height 4\' 10"  (1.473 m), weight 56.2 kg, SpO2 100 %.    Vent Mode: PRVC FiO2 (%):  [40 %] 40 % Set Rate:  [20 bmp] 20 bmp Vt Set:  [330 mL] 330 mL PEEP:  [5 cmH20] 5 cmH20 Plateau Pressure:  [14 cmH20-16 cmH20] 15 cmH20   Intake/Output Summary (Last 24 hours) at 04/21/2020 1052 Last data filed at 04/21/2020 0700 Gross per 24 hour  Intake 267.61 ml  Output 1135 ml  Net -867.39 ml   Filed Weights   04/18/20 0353 04/19/20 0259 04/20/20 0438  Weight: 57.4 kg 55.2 kg 56.2 kg   General: Critically ill appearing elderly F, intubated NAD  HEENT: NCAT Pink mm ETT secure Neuro: Initiates respirations. Pupils fixed 72mm. No corneal, no dolls eye. Withdraws to pain R>L CV: tachycardic rate. s1s2 no rgm  PULM: Symmetrical chest expansion, mechanically ventilated. No rhonchi or crackles GI: soft round non-distended  Extremities: No acute joint deformity. Hammertoe. No cyanosis or clubbing Skin: c/d/w no rash  Resolved Hospital Problem list     Assessment & Plan:   Large L fronto-parietal ICH with midline shift, global sulcal effacement, cerebral edema  -xarelto at home, got 4m in ED  P -not an operative candidate w NSGY -neuro following, pt s/p 3%  -repeat imaging without utility as it would not change prognosis  -this devastating ICH is not felt to be meaningfully survivable by multiple care teams -- GOC are  ongoing, I would recommend DNR (CCM has discussed futility of ACLS in setting of ICH with family, remains full code) and comfort care measures -Appreciate PCM assitance   Acute encephalopathy, in setting of devastating ICH above P -follow neuro exam   Acute respiratory failure requiring MV  in setting of  intracranial process above P -cont MV support -VAP, pulm hygiene -PRN fent  -not a good candidate for interventions like trach in setting of devastating ICH with grim prognosis   Atrial Fibrillation on home xarelto  -given kcentra  HTN Rate controlled, last echo with preserved EF P: -PRN metop -dc nicardipine   Toxic multi-nodal goiter  -cont Tapazole   L groin hernia  -small bowel loops protruding into hernia seen on CT p, without evidence of mechanical obstruction P -conservative management, will not engage CCS at this time given ongoing GOC for devastating neurologic injury above   GOC -devastating neuro injury -Ongoing goals of care, no escalation of current care -- Palliative care is following, Dr. Tonia Brooms PCCM spoke with family 2/25   Best practice (evaluated daily)  Diet: NPO Pain/Anxiety/Delirium protocol (if indicated): n/a VAP protocol (if indicated): HOB 30 degrees, suction prn DVT prophylaxis: SCD's GI prophylaxis: protonix Glucose control: SSI Mobility: bed rest Disposition:ICU  Goals of Care:  Last date of multidisciplinary goals of care discussion: 2/22 Family and staff present: Daughter Clydie Braun Summary of discussion: full  Follow up goals of care discussion due: 2/29 Code Status: full code  Labs   CBC: Recent Labs  Lab 23-Apr-2020 2239 04/18/20 0002 04/18/20 0340 04/18/20 0747 04/20/20 0059  WBC 8.5  --   --  9.3 8.6  NEUTROABS 7.5  --   --   --   --   HGB 14.1 12.9 13.6 12.4 12.8  HCT 45.4 38.0 40.0 38.5 39.4  MCV 91.0  --   --  89.7 91.0  PLT 202  --   --  172 149*    Basic Metabolic Panel: Recent Labs  Lab 2020-04-23 2239 04/18/20 0002 04/18/20 0340 04/18/20 0849 04/18/20 1315 04/19/20 0053 04/20/20 0059 04/21/20 0805  NA 139 139 141 139 144 148* 151* 153*  K 4.7 3.4* 3.3* 3.8  --   --  3.6 3.8  CL 102  --   --  105  --   --  115* 117*  CO2 26  --   --  23  --   --  24 24  GLUCOSE 166*  --   --  124*  --   --  126* 119*  BUN 12   --   --  12  --   --  20 18  CREATININE 0.69  --   --  0.73  --   --  0.70 0.67  CALCIUM 9.4  --   --  8.6*  --   --  9.2 9.0  MG  --   --   --  1.7  --   --   --   --   PHOS  --   --   --  2.9  --   --   --   --    GFR: Estimated Creatinine Clearance: 34 mL/min (by C-G formula based on SCr of 0.67 mg/dL). Recent Labs  Lab 23-Apr-2020 2239 04/18/20 0747 04/20/20 0059  WBC 8.5 9.3 8.6    Liver Function Tests: Recent Labs  Lab 2020-04-23 2239 04/21/20 0805  AST 39 20  ALT 21 14  ALKPHOS 107 79  BILITOT 0.6 1.2  PROT 7.8 6.8  ALBUMIN 3.5 2.3*   Recent Labs  Lab April 30, 2020 2239  LIPASE 23   Recent Labs  Lab 04-30-20 2240  AMMONIA 17    ABG    Component Value Date/Time   PHART 7.482 (H) 04/18/2020 0340   PCO2ART 35.9 04/18/2020 0340   PO2ART 109 (H) 04/18/2020 0340   HCO3 27.0 04/18/2020 0340   TCO2 28 04/18/2020 0340   O2SAT 99.0 04/18/2020 0340     Coagulation Profile: Recent Labs  Lab 04-30-2020 2239  INR 1.3*    Cardiac Enzymes: No results for input(s): CKTOTAL, CKMB, CKMBINDEX, TROPONINI in the last 168 hours.  HbA1C: Hgb A1c MFr Bld  Date/Time Value Ref Range Status  04/18/2020 02:16 AM 6.0 (H) 4.8 - 5.6 % Final    Comment:    (NOTE) Pre diabetes:          5.7%-6.4%  Diabetes:              >6.4%  Glycemic control for   <7.0% adults with diabetes   03/16/2008 06:35 PM  4.6 - 6.1 % Final   6.0 (NOTE)   The ADA recommends the following therapeutic goal for glycemic   control related to Hgb A1C measurement:   Goal of Therapy:   < 7.0% Hgb A1C   Reference: American Diabetes Association: Clinical Practice   Recommendations 2008, Diabetes Care,  2008, 31:(Suppl 1).    CBG: Recent Labs  Lab 04/20/20 2028 04/21/20 0004 04/21/20 0149 04/21/20 0344 04/21/20 0802  GLUCAP 118* 70 91 106* 111*    CRITICAL CARE Performed by: Lanier Clam  Total critical care time: 38 minutes  Critical care time was exclusive of separately billable procedures  and treating other patients. Critical care was necessary to treat or prevent imminent or life-threatening deterioration.  Critical care was time spent personally by me on the following activities: development of treatment plan with patient and/or surrogate as well as nursing, discussions with consultants, evaluation of patient's response to treatment, examination of patient, obtaining history from patient or surrogate, ordering and performing treatments and interventions, ordering and review of laboratory studies, ordering and review of radiographic studies, pulse oximetry and re-evaluation of patient's condition.   Tessie Fass MSN, AGACNP-BC [place] [facility] [phone] If no answer, [phone] 04/21/2020, 10:52 AM   Pulmonary critical care attending:  This is a 87 year old female, past history of atrial fibrillation on Xarelto, hypertension, multinodular goiter found to have a large frontoparietal intracranial hemorrhage with mass-effect and global effacement.  Patient was unresponsive and intubated.  Multiple discussions with patient's family regarding her overall prognosis.  Neurology has spoke with family as well.  BP (!) 165/107   Pulse (!) 144   Temp 99.4 F (37.4 C) (Oral)   Resp (!) 21   Ht 4\' 10"  (1.473 m)   Wt 56.2 kg   SpO2 100%   BMI 25.89 kg/m   General: Elderly female intubated mechanical life support HEENT: Pupils fixed dilated, unresponsive, no corneals Endotracheal tube in place Heart: Irregularly irregular, tachycardic Lungs: Bilateral mechanically ventilated breath sounds Abdomen: Mildly distended  Labs: Reviewed CT head results reviewed  Assessment: Large left frontoparietal intracranial hemorrhage with midline shift, global sulci effacement, cerebral edema, not a candidate for operative neurosurgery.  Felt to be a devastating intracranial hemorrhage and not to have a meaningful survivable outcome Acute hypoxemic respiratory failure  requiring mechanical ventilation Acute metabolic encephalopathy secondary to above Atrial fibrillation was on home Xarelto  however not able to anticoagulate due to ICH above. Hypertension  Plan: Long discussion with patient's family today at bedside and initially via phone early this morning. Remains on full mechanical vent support, weaning as tolerated Please see separate documentation regarding GOC. Status post Kcentra Unable to anticoagulate due to bleeding as documented above. If patient's family willing and understands the medical situation would recommend transition to comfort care. We appreciate palliative care team input.  This patient is critically ill with multiple organ system failure; which, requires frequent high complexity decision making, assessment, support, evaluation, and titration of therapies. This was completed through the application of advanced monitoring technologies and extensive interpretation of multiple databases. During this encounter critical care time was devoted to patient care services described in this note for 31 minutes.  Josephine IgoBradley L Emiliano Welshans, DO [facility] 04/21/2020 1:49 PM

## 2020-04-21 NOTE — Plan of Care (Signed)

## 2020-04-21 NOTE — Care Plan (Signed)
Per patient family request, I talked to patient daughter Colin Broach father in law, who is a Ship broker, Dr. Haynes Dage.  Updated them with patient current diagnosis, clinical picture and poor prognosis.  He stated that he had a son with anoxic brain injury underwent trach and PEG and improved.  Family wanted him to help making the decision.  He also recommend we call patient PCP to find letter patient had living will.  I also encouraged him to discuss with family again regarding comfort care measures given patient extremely poor prognosis.  He expressed understanding and appreciation.  I talked to palliative care NP Clerance Lav, she stated that patient has no PCP but has cardiologist whom we may able to contact.  However, she had talked to daughters yesterday extensively and family was actively discussing among themselves.  Before I go to see patient today, I heard from [place] that after Dr. Tonia Brooms talk to daughters, they requested comfort care measures, which I think is the right way to do.  Neurology will sign off. Please call with questions. Thanks for the consult.  Marvel Plan, MD PhD Stroke Neurology 04/21/2020 4:10 PM

## 2020-04-21 NOTE — Progress Notes (Signed)
Nutrition Brief Note  Chart reviewed. Pt now transitioning to comfort care.  No further nutrition interventions warranted at this time.  Please re-consult as needed.   Dazha Kempa RD, LDN Clinical Nutrition Pager listed in AMION    

## 2020-04-21 NOTE — Progress Notes (Signed)
PCCM Goals of Care Discussion and Advanced Care Planning:   Date: 04/21/2020   Present Parties: Daughter(s) and granddaughter   What was discussed:   We discussed her current medical comorbidities, atrial fibrillation on Xarelto, large frontoparietal intracranial hemorrhage with mass-effect and global effacement.  Neurology feels as if this is a devastating terminal head bleed.  Her neurologic exam remains poor.  She has bilateral fixed and dilated pupils.  Her brainstem function is however still intact and she will breathe over the ventilator spontaneously.  We talked about what the patient would want in this kind of situation and whether or not she would want to live attached to permanent mechanical vent support.  Outcome:   DNR Comfort care measures  Continue on vent support until family is ready for withdrawal.  22 mins of time was spent discussing the goals of care, advanced care planning options such as code status as well as do not resuscitate forms. [phone])

## 2020-04-21 NOTE — Progress Notes (Incomplete)
Daily Progress Note   Patient Name: Mary Chaney       Date: 04/21/2020 DOB: 1927-10-13  Age: 87 y.o. MRN#: [id number] Attending Physician: Josephine Igo, DO Primary Care Physician: Patient, No Pcp Per Admit Date: 04/01/2020  Reason for Consultation/Follow-up: To discuss complex medical decision making related to patient's goals of care  Patient with a fever of 101 over night.  Subjective: Received call from Clydie Braun asking if Dr. Roda Shutters could call her father in law who is a gerontologist (Dr. Haynes Dage).  Dr. Roda Shutters called.  Dr. Doyce Para was concerned that his daughter in law would simply not be able to decide to withdraw ventilator support.  Dr. Doyce Para recommended reviewing the patient's Advanced Directive.   I discussed this with Clydie Braun.  Clydie Braun states she is the [place] but has never seen AD paper work.  I encouraged her to look at the formal Will as well as the [facility] paper work because often AD paperwork is with those.  The AD would give Clydie Braun her mother guidance, her wishes in writing.  Clydie Braun implied that she is feeling weak and exhausted.  She told our team yesterday that the weight of this is causing her to have chest tightness and pain.  I attempted to encourage her and asked "if your mother was standing at her bedside right now observing the situation - what would she want done?"    I also reached out to Dr. Mendel Ryder the patient's cardiologist (she does not have a PCP).  He has known the patient for years and is willing to support the family.  Clydie Braun stated that his input would be appreciated.  Clydie Braun and her family planned to come in later today.  Received a call from Dr. Tonia Brooms.  Family coming in at 12:00 noon.  He asked me to be present for family support.   Assessment: Patient  non-responsive on vent.  Developed fever overnight.  Unfortunately she has suffered a non-survivable ICH. CT also revealed an enlarging lung nodule, enterocolitis, and possible toxic thyroid/goiter.  Patient Profile/HPI:  87 y.o. female  with past medical history of atrial fibrillation (on Xarelto), hypertension, chronic diastolic heart failure, and toxic multinodular goiter admitted on 04/24/2020 after being found unresponsive by family with last known normal on the evening of 04/16/2020. She was intubated for airway  protection and head CT demonstrated large ICH with significant mass effect of 30mm.   Palliative care has been consulted to discuss goals of care with family as the patient is not a surgical candidate and this is felt to be non-survivable injury.    Length of Stay: 3   Vital Signs: BP 139/83 (BP Location: Left Arm)   Pulse (!) 117   Temp 100 F (37.8 C) (Oral)   Resp 20   Ht 4\' 10"  (1.473 m)   Wt 56.2 kg   SpO2 100%   BMI 25.89 kg/m  SpO2: SpO2: 100 % O2 Device: O2 Device: Ventilator O2 Flow Rate: O2 Flow Rate (L/min): 15 L/min       Palliative Assessment/Data: 10%     Palliative Care Plan    Recommendations/Plan:    Code Status:  {Palliative Code status:23503}  Prognosis:   {Palliative Care Prognosis:23504}   Discharge Planning:  {Palliative dispostion:23505}  Care plan was discussed with ***  Thank you for allowing the Palliative Medicine Team to assist in the care of this patient.  Total time spent:       Greater than 50%  of this time was spent counseling and coordinating care related to the above assessment and plan.  , PA-C Palliative Medicine  Please contact Palliative MedicineTeam phone at [phone] for questions and concerns between 7 am - 7 pm.   Please see AMION for individual provider pager numbers.

## 2020-04-21 NOTE — Progress Notes (Signed)
This chaplain responded to PMT consult for F/U spiritual care.  The chaplain shared a bedside presence and companionship with the Pt. two daughters and grand daughter as they openly share and experience anticipatory grief.  The chaplain discussed grief resources in the community and heard them commit to supporting each other.  The chaplain will grief care resources with the Palliative Care Team.

## 2020-04-22 DIAGNOSIS — Z515 Encounter for palliative care: Secondary | ICD-10-CM | POA: Diagnosis not present

## 2020-04-22 DIAGNOSIS — R4189 Other symptoms and signs involving cognitive functions and awareness: Secondary | ICD-10-CM | POA: Diagnosis not present

## 2020-04-22 DIAGNOSIS — I610 Nontraumatic intracerebral hemorrhage in hemisphere, subcortical: Secondary | ICD-10-CM | POA: Diagnosis not present

## 2020-04-22 DIAGNOSIS — Z7189 Other specified counseling: Secondary | ICD-10-CM | POA: Diagnosis not present

## 2020-04-22 LAB — CULTURE, BLOOD (ROUTINE X 2): Culture: NO GROWTH

## 2020-04-22 MED ORDER — SODIUM CHLORIDE 0.9 % IV SOLN
1.0000 mg/h | INTRAVENOUS | Status: DC
  Administered 2020-04-22: 1 mg/h via INTRAVENOUS
  Filled 2020-04-22: qty 5

## 2020-04-23 LAB — CULTURE, BLOOD (ROUTINE X 2): Culture: NO GROWTH

## 2020-04-25 NOTE — Procedures (Signed)
Extubation Procedure Note  Patient Details:   Name: Mary Chaney DOB: 1927/05/12 MRN: [id number]   Airway Documentation:    Vent end date: 04/24/2020 Vent end time: 1828   Evaluation   Patient extubated per comfort care measures. RN at bedside.   Harmon Dun Randall Colden 04/09/2020, 6:29 PM

## 2020-04-25 NOTE — Progress Notes (Signed)
PCCM:  I met with family. Extubation orders placed once family is ready.   Garner Nash, DO [facility] 05-11-20 2:38 PM

## 2020-04-25 NOTE — Progress Notes (Signed)
   Palliative Medicine Inpatient Follow Up Note  HPI: 87 y.o.femalewith past medical history of atrial fibrillation (on Xarelto), hypertension,chronic diastolic heart failure, andtoxic multinodular goiteradmitted on 2/21/2022after being found unresponsive by family with last known normal on the evening of 04/16/2020. She was intubated for airway protection and head CT demonstratedlarge ICH with significant mass effectof8mm.   Palliative care has been consulted to discuss goals of care with family as the patient is not a surgical candidate and this is felt to be non-survivable injury.She was made comfort care on 2/25.  Today's Discussion (05/14/2020):  *Please note that this is a verbal dictation therefore any spelling or grammatical errors are due to the "[employer]agon [employer] One" system interpretation.  Chart reviewed.  I stopped by Mary Chaney's room this morning. She remains on ventilatory support and on a dilaudid gtt. There was no family present at bedside at this time.  I spoke to patients bedside RN, Alcario Drought who shared that there have been no family members present this morning though per her understanding they should be stopping by throughout the day. After family has visited liberation from ventilatory support will be pursued.  Palliative care will be peripherally available for any additional medical team or family needs.   Objective Assessment: Vital Signs Vitals:   May 14, 2020 1000 2020-05-14 1100  BP:    Pulse: 73 75  Resp: 20 20  Temp:    SpO2: 100% 100%    Intake/Output Summary (Last 24 hours) at 05/14/2020 1142 Last data filed at May 14, 2020 1100 Gross per 24 hour  Intake 128.78 ml  Output 300 ml  Net -171.22 ml   Last Weight  Most recent update: 04/20/2020  4:38 AM   Weight  56.2 kg (123 lb 14.4 oz)           Gen:  Elderly AA F on ventilatory support HEENT: ETT, dry mucous membranes CV: Regular rate and rhythm  PULM: On mechanical ventilatory support ABD: soft,  nondistended  EXT: (+) inverted great toes Neuro: comatose  SUMMARY OF RECOMMENDATIONS   DNAR  Comfort Measure per CCM  Unrestricted visitation  Continue dilaudid gtt to aid in symptom support  Plan for family to visit prior to compassionate extubation   Please reach out to PMT if additional support is needed  Time Spent: 15 Greater than 50% of the time was spent in counseling and coordination of care ______________________________________________________________________________________ Mary Chaney [place] Palliative Medicine Team Team Cell Phone: [phone] Please utilize secure chat with additional questions, if there is no response within 30 minutes please call the above phone number  Palliative Medicine Team providers are available by phone from 7am to 7pm daily and can be reached through the team cell phone.  Should this patient require assistance outside of these hours, please call the patient's attending physician.

## 2020-04-25 NOTE — Progress Notes (Signed)
NAME:  Mary Chaney, MRN:  [id number], DOB:  09-30-1927, LOS: 4 ADMISSION DATE:  04/07/2020, CONSULTATION DATE:  2020-04-29 REFERRING MD:  EDP, CHIEF COMPLAINT:  unresponsive   Brief History:  87 y.o. F with PMH of Atrial Fibrillation on Xarelto, HTN, multi-nodular goiter who was found unresponsive by family and has large fronto-parietal ICH with mass effect and global effacement.  Intubated and PCCM consulted for admission.  History of Present Illness:  Mary Chaney is a 87 y.o. F with PMH of Atrial Fibrillation on Xarelto, HTN, multi-nodular goiter who was found unresponsive by family, last known well around 7pm.  Pt was breathing, but unresponsive on EMS arrival and was intubated in the ED.  ED work-up significant for [facility] showing Intracerebral hemorrhage within the left frontoparietal lobes with extensive surrounding hypoattenuating edema and significant mass effect with global sulcal effacement. Labs grossly unremarkable, INR 1.3.   CXR with possible developing LLL infiltrate.  Per neurosurgery, pt is not a surgical candidate.  She was given Kcentra and PCCM consulted for admission  Past Medical History:   has a past medical history of Atrial fibrillation (HCC), Hemorrhoids, Hypertension, and Toxic multinodular goiter.  Significant Hospital Events:  2/22 Admit to PCCM  Consults:  Neurosurgery Neurology Palliative Care Procedures:  2/22 ETT  Significant Diagnostic Tests:  2/22 CT head/abdomen/pelvis>>Intracerebral hemorrhage within the left frontoparietal lobes measuring up to 8 x 5.6 x 5.8 cm in size. Extensive surrounding hypoattenuating edema. Significant mass effect with global sulcal effacement of the left cerebral hemisphere, 18 mm of left to right midline shift, medialization of the left uncus and indentation of the contralateral right cerebral crus against the right tentorium cerebelli.  Micro Data:  2/22 BCx2>> no growth at 2 days   Antimicrobials:  na  Interim  History / Subjective:   Awaiting family for final visitations and plan for removal of mechanical support.  Comfort care measures instituted at this time.  Objective   Blood pressure 90/61, pulse 73, temperature 99.4 F (37.4 C), temperature source Oral, resp. rate 20, height 4\' 10"  (1.473 m), weight 56.2 kg, SpO2 100 %.    Vent Mode: PRVC FiO2 (%):  [40 %] 40 % Set Rate:  [20 bmp] 20 bmp Vt Set:  [330 mL] 330 mL PEEP:  [5 cmH20] 5 cmH20 Plateau Pressure:  [13 cmH20-15 cmH20] 15 cmH20   Intake/Output Summary (Last 24 hours) at 29-Apr-2020 1051 Last data filed at 2020-04-29 1000 Gross per 24 hour  Intake 126.77 ml  Output 300 ml  Net -173.23 ml   Filed Weights   04/18/20 0353 04/19/20 0259 04/20/20 0438  Weight: 57.4 kg 55.2 kg 56.2 kg   General: Well female intubated on mechanical life support HEENT: Endotracheal tube in place Neuro: [facility] Problem list     Assessment & Plan:    Large left frontoparietal intracranial hemorrhage with midline shift, global effacement and cerebral edema Acute metabolic encephalopathy secondary to devastating ICH as above Atrial fibrillation on Xarelto prior to admission Goals of care discussion and current clinical scenario the decision was made for comfort care transition.  Patient remains on mechanical support until all family have had a chance to visit.  Plan: Remain on mechanical support until comfort care transition fully occurs. Comfort is the only goal at this time. Remains on continuous opiates for comfort. Once family is ready we will liberate from ventilator hopefully today.  After all family have had a chance to visit.    Labs  CBC: Recent Labs  Lab May 06, 2020 2239 04/18/20 0002 04/18/20 0340 04/18/20 0747 04/20/20 0059  WBC 8.5  --   --  9.3 8.6  NEUTROABS 7.5  --   --   --   --   HGB 14.1 12.9 13.6 12.4 12.8  HCT 45.4 38.0 40.0 38.5 39.4  MCV 91.0  --   --  89.7 91.0  PLT 202  --   --  172  149*    Basic Metabolic Panel: Recent Labs  Lab 05/06/20 2239 04/18/20 0002 04/18/20 0340 04/18/20 0849 04/18/20 1315 04/19/20 0053 04/20/20 0059 04/21/20 0805  NA 139 139 141 139 144 148* 151* 153*  K 4.7 3.4* 3.3* 3.8  --   --  3.6 3.8  CL 102  --   --  105  --   --  115* 117*  CO2 26  --   --  23  --   --  24 24  GLUCOSE 166*  --   --  124*  --   --  126* 119*  BUN 12  --   --  12  --   --  20 18  CREATININE 0.69  --   --  0.73  --   --  0.70 0.67  CALCIUM 9.4  --   --  8.6*  --   --  9.2 9.0  MG  --   --   --  1.7  --   --   --   --   PHOS  --   --   --  2.9  --   --   --   --    GFR: Estimated Creatinine Clearance: 34 mL/min (by C-G formula based on SCr of 0.67 mg/dL). Recent Labs  Lab 2020-05-06 2239 04/18/20 0747 04/20/20 0059  WBC 8.5 9.3 8.6    Liver Function Tests: Recent Labs  Lab May 06, 2020 2239 04/21/20 0805  AST 39 20  ALT 21 14  ALKPHOS 107 79  BILITOT 0.6 1.2  PROT 7.8 6.8  ALBUMIN 3.5 2.3*   Recent Labs  Lab 2020-05-06 2239  LIPASE 23   Recent Labs  Lab 05-06-2020 2240  AMMONIA 17    ABG    Component Value Date/Time   PHART 7.482 (H) 04/18/2020 0340   PCO2ART 35.9 04/18/2020 0340   PO2ART 109 (H) 04/18/2020 0340   HCO3 27.0 04/18/2020 0340   TCO2 28 04/18/2020 0340   O2SAT 99.0 04/18/2020 0340     Coagulation Profile: Recent Labs  Lab 2020/05/06 2239  INR 1.3*    Cardiac Enzymes: No results for input(s): CKTOTAL, CKMB, CKMBINDEX, TROPONINI in the last 168 hours.  HbA1C: Hgb A1c MFr Bld  Date/Time Value Ref Range Status  04/18/2020 02:16 AM 6.0 (H) 4.8 - 5.6 % Final    Comment:    (NOTE) Pre diabetes:          5.7%-6.4%  Diabetes:              >6.4%  Glycemic control for   <7.0% adults with diabetes   03/16/2008 06:35 PM  4.6 - 6.1 % Final   6.0 (NOTE)   The ADA recommends the following therapeutic goal for glycemic   control related to Hgb A1C measurement:   Goal of Therapy:   < 7.0% Hgb A1C   Reference: American  Diabetes Association: Clinical Practice   Recommendations 2008, Diabetes Care,  2008, 31:(Suppl 1).    CBG: Recent Labs  Lab 04/21/20 1153  04/21/20 1158 04/21/20 1220 04/21/20 1222 04/21/20 1243  GLUCAP 69* 63* 62* 59* 71    Josephine Igo, DO [facility] 04/04/2020 10:51 AM

## 2020-04-25 NOTE — Progress Notes (Signed)
19ml Dilaudid wasted in Stericycle with Loney Loh., RN

## 2020-04-25 NOTE — Progress Notes (Signed)
Patient terminally extubated at 1828. At 1902 patient passed. Two nurse verification. Strip printed and placed in chart. Night Time RN to notify family and night time MD.

## 2020-04-25 DEATH — deceased

## 2020-05-26 NOTE — Death Summary Note (Signed)
DEATH SUMMARY   Patient Details  Name: Mary Chaney Loma MRN: [id number] DOB: 1927/04/30  Admission/Discharge Information   Admit Date:  04/12/2020  Date of Death: Date of Death: 06/13/20  Time of Death: Time of Death: 1902  Length of Stay: 4  Referring Physician: Patient, No Pcp Per   Reason(s) for Hospitalization  87 y.o. F with PMH of Atrial Fibrillation on Xarelto, HTN, multi-nodular goiter who was found unresponsive by family and has large fronto-parietal ICH with mass effect and global effacement.  Intubated and PCCM consulted for admission.  Diagnoses  Preliminary cause of death: ICH (intracerebral hemorrhage) (HCC) Secondary Diagnoses (including complications and co-morbidities):  Active Problems:   Intracranial bleed [facility](HCC)   Intracerebral hemorrhage   Palliative care by specialist   Counseling regarding advance care planning and goals of care   Brief Hospital Course (including significant findings, care, treatment, and services provided and events leading to death)  Mary Chaney Shisler is a 87 y.o. year old female who PMH of Atrial Fibrillation on Xarelto, HTN, multi-nodular goiter who was found unresponsive by family, last known well around 7pm.  Pt was breathing, but unresponsive on EMS arrival and was intubated in the ED.  [facility] Intracerebral hemorrhage within the left frontoparietal lobes with extensive surrounding hypoattenuating edema and significant mass effect with global sulcal effacement. Labs grossly unremarkable, INR 1.3.   CXR with possible developing LLL infiltrate.  Per neurosurgery, pt is not a surgical candidate.  She was given Kcentra and PCCM consulted for admission  Large left frontoparietal intracranial hemorrhage with midline shift, global effacement and cerebral edema Acute metabolic encephalopathy secondary to devastating ICH as above Atrial fibrillation on Xarelto prior to admission Goals of care discussion and current  clinical scenario the decision was made for comfort care transition.  Patient remains on mechanical support until all family have had a chance to visit.  Remain on mechanical support until comfort care transition fully occurs. Comfort is the only goal at this time. Remains on continuous opiates for comfort. Once family is ready we will liberate from ventilator hopefully today.  After all family have had a chance to visit.  Patient passed peacefully in the ICU on comfort measures   Pertinent Labs and Studies  Significant Diagnostic Studies CT Head Wo Contrast  Result Date: 04/18/2020 CLINICAL DATA:  Found down at 19:00 on 04/16/2020, posturing, history of heart disease EXAM: CT HEAD WITHOUT CONTRAST CT CERVICAL SPINE WITHOUT CONTRAST CT CHEST, ABDOMEN AND PELVIS WITH CONTRAST TECHNIQUE: Contiguous axial images were obtained from the base of the skull through the vertex without intravenous contrast. Multidetector CT imaging of the cervical spine was performed without intravenous contrast. Multiplanar CT image reconstructions were also generated. Multidetector CT imaging of the chest, abdomen and pelvis was performed following the standard protocol during bolus administration of intravenous contrast. CONTRAST:  100mL OMNIPAQUE IOHEXOL 300 MG/ML  SOLN COMPARISON:  CT head and chest angiography 11/26/2015 FINDINGS: CT HEAD FINDINGS Brain: Massive intracerebral hemorrhage within the left frontoparietal lobes measuring up to 8 x 5.6 x 5.8 cm in size. Extensive surrounding hypoattenuating edema. Significant mass effect with global sulcal effacement of the left cerebral hemisphere, effacement of the left lateral ventricle, 18 mm of left to right midline shift, medial ization of the uncus and indentation of the contralateral cerebral crus against the right tentorium cerebelli. No other acute sites of intracranial hemorrhage or extra-axial collection. Findings on a background of diffuse parenchymal volume loss.  Patchy areas  of white matter hypoattenuation are most compatible with chronic microvascular angiopathy. Vascular: Atherosclerotic calcification of the carotid siphons and intradural vertebral arteries. No hyperdense vessel. Skull: Right temporal and left parieto-occipital scalp swelling and thickening. No subjacent calvarial fractures. Mild edematous changes of the scalp. Sinuses/Orbits: Diffuse thickening in the ethmoids with pneumatized secretions in the posterior nasopharynx and nasal passages. Remaining paranasal sinuses and mastoid air cells are predominantly clear. Middle ear cavities are clear. Orbital structures are unremarkable aside from prior lens extractions. Other: Edentulous.  Transesophageal and endotracheal tubes in place. CT CERVICAL FINDINGS Alignment: Slight reversal of normal cervical lordosis at the C2-3 level. Minimal anterolisthesis C2 on C3 favored to be on a degenerative basis. Similar likely degenerative anterolisthesis noted C7 on T1. No evidence of traumatic listhesis. No abnormally widened, perched or jumped facets. Normal alignment of the craniocervical and atlantoaxial articulations accounting for rightward cranial rotation and slight rightward lateral cervical flexion. Skull base and vertebrae: The osseous structures appear diffusely demineralized which may limit detection of small or nondisplaced fractures. No acute skull base fracture. No vertebral body fracture or height loss. Soft tissues and spinal canal: No prevertebral swelling, fluid or gas. Mild edematous changes noted along the nuchal ligament and cervical musculature posteriorly. Endotracheal and transesophageal tubes are in place. Secretions noted in the posterior naso and oropharynx as imaged. Cervical carotid atherosclerosis. Disc levels: Multilevel intervertebral disc height loss with spondylitic endplate changes. Larger disc osteophyte complexes are present C2-3, C3-4 and C6-7 which partially efface the ventral thecal  sac without significant resulting canal impingement. Multilevel uncinate spurring and facet hypertrophic changes result in mild-to-moderate foraminal narrowing most pronounced at the upper cervical levels. Other: Massively enlarged, heterogeneous multinodular thyroid gland extending inferiorly into the thoracic inlet within overall appearance, similar to comparison CT chest imaging. CT CHEST FINDINGS Cardiovascular: Cardiomegaly with predominantly biatrial enlargement. Coronary artery calcifications. Small pericardial effusion with fluid in the pericardial recesses. Atherosclerotic plaque within the normal caliber aorta. No acute luminal abnormality of the imaged aorta. No periaortic stranding or hemorrhage. Normal 3 vessel branching of the aortic arch. Shared origin of the brachiocephalic and left common carotid arteries. Splaying of the proximal great vessels with tortuosity secondary to large thyroid extending into the thoracic inlet. Mediastinum/Nodes: Massively enlarged, heterogeneous and multinodular thyroid gland. Extension of the thyroid gland into the thoracic thyroid dimensions. The left lobe thyroid gland measures up to 5.6 x 6.5 x 11.2 cm in conglomerate dimension. More moderate enlargement and nodularity of the right gland. Resulting right paratracheal esophageal shift. Endotracheal tube tip terminates low in the trachea, 1 cm from the carina. Recommend retraction 2 cm to the mid trachea. Transesophageal tube tip terminates in the gastric body with side port distal to the GE junction. This type the presence of the transesophageal tube, the thoracic esophagus is patulous and fluid-filled. No worrisome mediastinal, hilar or axillary adenopathy. Lungs/Pleura: Small bilateral pleural effusions. Mixed dependent and passive atelectatic changes in the lungs. Additional bandlike areas of scarring and/or subsegmental atelectasis. Redemonstration of a solid nodule seen in the superior segment left lower lobe this  measures 12 x 14 by 18 mm, previously 8 x 11 x 9 mm reflecting interval growth however the attenuation of this structure closely matches that of simple fluid. Musculoskeletal: Multilevel degenerative changes are present in the imaged portions of the spine. Features most pronounced T10-11. Dextrocurvature of the midthoracic spine. Mild levocurvature at the cervicothoracic junction. Additional degenerative changes in the bilateral shoulders. No acute or worrisome osseous lesion. Extensive  circumferential body wall edema noted. CT ABDOMEN PELVIS FINDINGS Hepatobiliary: Some diffuse geographic regions of a [place] hypoattenuation with more focal sparing in the left lobe liver. More uniform enhancement on the delayed phase imaging. Hypoattenuating 1.7 cm lesion in the tip of the left lobe liver with some nodular discontinuous peripheral enhancement and centripetal filling on delayed phase imaging most suggestive of a hepatic hemangioma. Smooth liver surface contour. Normal gallbladder and. No significant biliary ductal dilatation. Pancreas: No pancreatic ductal dilatation or surrounding inflammatory changes. Spleen: Normal in size. No concerning splenic lesions. Adrenals/Urinary Tract: Normal adrenals. Kidneys are normally located with symmetric enhancement and excretion. Few small fluid attenuation cysts are present bilaterally. Additional subcentimeter hypoattenuating foci seen in both kidneys too small to fully characterize on CT imaging but statistically likely benign. There are no suspicious renal lesion, urolithiasis or hydronephrosis. Moderate distention of the urinary bladder without other gross abnormality. Stomach/Bowel: Transesophageal tube tip and side port distal to the GE junction. Some mild thickening towards the gastric antrum postulated to peristaltic motion some mild diffuse mural thickening of both large and small bowel. Multiple to of small bowel loops are seen protruding into a large left groin  hernia albeit without evidence of mechanical obstruction or more focal thickening to suggest strangulation/vascular compromise at this time. No other sites of bowel obstruction. Normal appendix. Scattered colonic diverticula without focal inflammation to suggest diverticulitis. Vascular/Lymphatic: Atherosclerotic calcifications within the abdominal aorta and branch vessels. No aneurysm or ectasia. Enlarged right groin adenopathy with low-attenuation nodes measuring up to 13 mm short axis (3/103). No other enlarged abdominopelvic nodes. Reproductive: Numerous heterogeneously calcified uterine fibroids are present. Ovarian tissue is poorly visualized though no discrete concerning adnexal lesions are seen. Other: Large left groin hernia containing both fat and bowel, as described above. An additional ventral midline fat containing hernia containing small amount of low-attenuation fluid and extensive stranding of the herniated fat contents suggestive of least mild strangulation reactive change. No other bowel containing hernias. Extensive circumferential body wall edema. Musculoskeletal: Multilevel degenerative changes are present in the imaged portions of the spine. Grade 1 anterolisthesis L4 on 5 without spondylolysis. Partial ankylosis of the L5-S1 levels. Additional moderate degenerative change in the bilateral hips and SI joints. IMPRESSION: CT head: 1. Intracerebral hemorrhage within the left frontoparietal lobes measuring up to 8 x 5.6 x 5.8 cm in size. Extensive surrounding hypoattenuating edema. Significant mass effect with global sulcal effacement of the left cerebral hemisphere, 18 mm of left to right midline shift, medialization of the left uncus and indentation of the contralateral right cerebral crus against the right tentorium cerebelli. 2. Background of diffuse parenchymal volume loss and microvascular angiopathy with intracranial atherosclerosis. 3. Extensive sites of scalp swelling versus edema.  Correlate for tenderness. Findings most pronounced in the right temporal and left parieto-occipital regions. CT cervical spine: 1. No acute fracture or traumatic listhesis of the cervical spine. 2. Diffuse edematous changes of the posterior cervical musculature, left greater than right. Possibly related to more diffuse anasarca changes elsewhere. 3. Multilevel cervical spondylitic changes as detailed above. CT chest, abdomen and pelvis 1. Endotracheal tube tip terminates low in the trachea, 1 cm from the carina. Recommend retraction 2 cm to the mid trachea. 2. Appropriate positioning of the endotracheal tube. 3. Features could suggest underlying CHF/heart failure with cardiomegaly small bilateral effusions and pulmonary vascular congestion. Additional diffuse features of anasarca with circumferential body wall edema. Heterogeneous enhancement of the liver, may reflect some underlying hepatic steatosis or altered hepatic  perfusion dynamics or hepatic congestion in the setting of heart failure. 4. Mixed dependent and passive atelectatic changes in the lung bases. 5. Massively enlarged, heterogeneous and multinodular thyroid gland, with extension of the thyroid gland into the thoracic inlet. 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 Similar appearance to comparison exam and in the setting of significant comorbidities or limited life expectancy, no follow-up recommended (ref: J Am Coll Radiol. 2015 Feb;12(2): 143-50). 6. Redemonstration of a solid nodule in the superior segment left lower lobe this measures 12 x 14 x 18 mm, previously 8 x 11 x 9 mm reflecting interval growth however the attenuation of this structure closely matches that of simple fluid. Overall indeterminate though malignancy not fully excluded. Consider continued follow-up imaging if felt to be clinically warranted. 7. Fat and bowel containing left groin hernia without evidence of mechanical obstruction or strangulation at this level. 8.  Additional fat containing upper abdominal ventral hernia with fatty stranding and trace free fluid concerning for fat strangulation. 9. Diffusely edematous parents of both large and small bowel, could correlate for clinical features of an enterocolitis, either infectious or inflammatory. Critical results were called by telephone at the time of interpretation on 04/18/2020 at 12:58 am to provider Dr. Mellody Dance, who verbally acknowledged these results. Electronically Signed   By: Kreg Shropshire M.D.   On: 04/18/2020 01:24   CT Cervical Spine Wo Contrast  Result Date: 04/18/2020 CLINICAL DATA:  Found down at 19:00 on 04/16/2020, posturing, history of heart disease EXAM: CT HEAD WITHOUT CONTRAST CT CERVICAL SPINE WITHOUT CONTRAST CT CHEST, ABDOMEN AND PELVIS WITH CONTRAST TECHNIQUE: Contiguous axial images were obtained from the base of the skull through the vertex without intravenous contrast. Multidetector CT imaging of the cervical spine was performed without intravenous contrast. Multiplanar CT image reconstructions were also generated. Multidetector CT imaging of the chest, abdomen and pelvis was performed following the standard protocol during bolus administration of intravenous contrast. CONTRAST:  OMNIPAQUE IOHEXOL 300 MG/ML  SOLN COMPARISON:  CT head and chest angiography 11/26/2015 FINDINGS: CT HEAD FINDINGS Brain: Massive intracerebral hemorrhage within the left frontoparietal lobes measuring up to 8 x 5.6 x 5.8 cm in size. Extensive surrounding hypoattenuating edema. Significant mass effect with global sulcal effacement of the left cerebral hemisphere, effacement of the left lateral ventricle, 18 mm of left to right midline shift, medial ization of the uncus and indentation of the contralateral cerebral crus against the right tentorium cerebelli. No other acute sites of intracranial hemorrhage or extra-axial collection. Findings on a background of diffuse parenchymal volume loss. Patchy areas of white  matter hypoattenuation are most compatible with chronic microvascular angiopathy. Vascular: Atherosclerotic calcification of the carotid siphons and intradural vertebral arteries. No hyperdense vessel. Skull: Right temporal and left parieto-occipital scalp swelling and thickening. No subjacent calvarial fractures. Mild edematous changes of the scalp. Sinuses/Orbits: Diffuse thickening in the ethmoids with pneumatized secretions in the posterior nasopharynx and nasal passages. Remaining paranasal sinuses and mastoid air cells are predominantly clear. Middle ear cavities are clear. Orbital structures are unremarkable aside from prior lens extractions. Other: Edentulous.  Transesophageal and endotracheal tubes in place. CT CERVICAL FINDINGS Alignment: Slight reversal of normal cervical lordosis at the C2-3 level. Minimal anterolisthesis C2 on C3 favored to be on a degenerative basis. Similar likely degenerative anterolisthesis noted C7 on T1. No evidence of traumatic listhesis. No abnormally widened, perched or jumped facets. Normal alignment of the craniocervical and atlantoaxial articulations accounting for rightward cranial rotation and slight rightward lateral  cervical flexion. Skull base and vertebrae: The osseous structures appear diffusely demineralized which may limit detection of small or nondisplaced fractures. No acute skull base fracture. No vertebral body fracture or height loss. Soft tissues and spinal canal: No prevertebral swelling, fluid or gas. Mild edematous changes noted along the nuchal ligament and cervical musculature posteriorly. Endotracheal and transesophageal tubes are in place. Secretions noted in the posterior naso and oropharynx as imaged. Cervical carotid atherosclerosis. Disc levels: Multilevel intervertebral disc height loss with spondylitic endplate changes. Larger disc osteophyte complexes are present C2-3, C3-4 and C6-7 which partially efface the ventral thecal sac without  significant resulting canal impingement. Multilevel uncinate spurring and facet hypertrophic changes result in mild-to-moderate foraminal narrowing most pronounced at the upper cervical levels. Other: Massively enlarged, heterogeneous multinodular thyroid gland extending inferiorly into the thoracic inlet within overall appearance, similar to comparison CT chest imaging. CT CHEST FINDINGS Cardiovascular: Cardiomegaly with predominantly biatrial enlargement. Coronary artery calcifications. Small pericardial effusion with fluid in the pericardial recesses. Atherosclerotic plaque within the normal caliber aorta. No acute luminal abnormality of the imaged aorta. No periaortic stranding or hemorrhage. Normal 3 vessel branching of the aortic arch. Shared origin of the brachiocephalic and left common carotid arteries. Splaying of the proximal great vessels with tortuosity secondary to large thyroid extending into the thoracic inlet. Mediastinum/Nodes: Massively enlarged, heterogeneous and multinodular thyroid gland. Extension of the thyroid gland into the thoracic thyroid dimensions. The left lobe thyroid gland measures up to 5.6 x 6.5 x 11.2 cm in conglomerate dimension. More moderate enlargement and nodularity of the right gland. Resulting right paratracheal esophageal shift. Endotracheal tube tip terminates low in the trachea, 1 cm from the carina. Recommend retraction 2 cm to the mid trachea. Transesophageal tube tip terminates in the gastric body with side port distal to the GE junction. This type the presence of the transesophageal tube, the thoracic esophagus is patulous and fluid-filled. No worrisome mediastinal, hilar or axillary adenopathy. Lungs/Pleura: Small bilateral pleural effusions. Mixed dependent and passive atelectatic changes in the lungs. Additional bandlike areas of scarring and/or subsegmental atelectasis. Redemonstration of a solid nodule seen in the superior segment left lower lobe this measures 12  x 14 by 18 mm, previously 8 x 11 x 9 mm reflecting interval growth however the attenuation of this structure closely matches that of simple fluid. Musculoskeletal: Multilevel degenerative changes are present in the imaged portions of the spine. Features most pronounced T10-11. Dextrocurvature of the midthoracic spine. Mild levocurvature at the cervicothoracic junction. Additional degenerative changes in the bilateral shoulders. No acute or worrisome osseous lesion. Extensive circumferential body wall edema noted. CT ABDOMEN PELVIS FINDINGS Hepatobiliary: Some diffuse geographic regions of a [place] hypoattenuation with more focal sparing in the left lobe liver. More uniform enhancement on the delayed phase imaging. Hypoattenuating 1.7 cm lesion in the tip of the left lobe liver with some nodular discontinuous peripheral enhancement and centripetal filling on delayed phase imaging most suggestive of a hepatic hemangioma. Smooth liver surface contour. Normal gallbladder and. No significant biliary ductal dilatation. Pancreas: No pancreatic ductal dilatation or surrounding inflammatory changes. Spleen: Normal in size. No concerning splenic lesions. Adrenals/Urinary Tract: Normal adrenals. Kidneys are normally located with symmetric enhancement and excretion. Few small fluid attenuation cysts are present bilaterally. Additional subcentimeter hypoattenuating foci seen in both kidneys too small to fully characterize on CT imaging but statistically likely benign. There are no suspicious renal lesion, urolithiasis or hydronephrosis. Moderate distention of the urinary bladder without other gross abnormality. Stomach/Bowel: Transesophageal  tube tip and side port distal to the GE junction. Some mild thickening towards the gastric antrum postulated to peristaltic motion some mild diffuse mural thickening of both large and small bowel. Multiple to of small bowel loops are seen protruding into a large left groin hernia albeit  without evidence of mechanical obstruction or more focal thickening to suggest strangulation/vascular compromise at this time. No other sites of bowel obstruction. Normal appendix. Scattered colonic diverticula without focal inflammation to suggest diverticulitis. Vascular/Lymphatic: Atherosclerotic calcifications within the abdominal aorta and branch vessels. No aneurysm or ectasia. Enlarged right groin adenopathy with low-attenuation nodes measuring up to 13 mm short axis (3/103). No other enlarged abdominopelvic nodes. Reproductive: Numerous heterogeneously calcified uterine fibroids are present. Ovarian tissue is poorly visualized though no discrete concerning adnexal lesions are seen. Other: Large left groin hernia containing both fat and bowel, as described above. An additional ventral midline fat containing hernia containing small amount of low-attenuation fluid and extensive stranding of the herniated fat contents suggestive of least mild strangulation reactive change. No other bowel containing hernias. Extensive circumferential body wall edema. Musculoskeletal: Multilevel degenerative changes are present in the imaged portions of the spine. Grade 1 anterolisthesis L4 on 5 without spondylolysis. Partial ankylosis of the L5-S1 levels. Additional moderate degenerative change in the bilateral hips and SI joints. IMPRESSION: CT head: 1. Intracerebral hemorrhage within the left frontoparietal lobes measuring up to 8 x 5.6 x 5.8 cm in size. Extensive surrounding hypoattenuating edema. Significant mass effect with global sulcal effacement of the left cerebral hemisphere, 18 mm of left to right midline shift, medialization of the left uncus and indentation of the contralateral right cerebral crus against the right tentorium cerebelli. 2. Background of diffuse parenchymal volume loss and microvascular angiopathy with intracranial atherosclerosis. 3. Extensive sites of scalp swelling versus edema. Correlate for  tenderness. Findings most pronounced in the right temporal and left parieto-occipital regions. CT cervical spine: 1. No acute fracture or traumatic listhesis of the cervical spine. 2. Diffuse edematous changes of the posterior cervical musculature, left greater than right. Possibly related to more diffuse anasarca changes elsewhere. 3. Multilevel cervical spondylitic changes as detailed above. CT chest, abdomen and pelvis 1. Endotracheal tube tip terminates low in the trachea, 1 cm from the carina. Recommend retraction 2 cm to the mid trachea. 2. Appropriate positioning of the endotracheal tube. 3. Features could suggest underlying CHF/heart failure with cardiomegaly small bilateral effusions and pulmonary vascular congestion. Additional diffuse features of anasarca with circumferential body wall edema. Heterogeneous enhancement of the liver, may reflect some underlying hepatic steatosis or altered hepatic perfusion dynamics or hepatic congestion in the setting of heart failure. 4. Mixed dependent and passive atelectatic changes in the lung bases. 5. Massively enlarged, heterogeneous and multinodular thyroid gland, with extension of the thyroid gland into the thoracic inlet. 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 Similar appearance to comparison exam and in the setting of significant comorbidities or limited life expectancy, no follow-up recommended (ref: J Am Coll Radiol. 2015 Feb;12(2): 143-50). 6. Redemonstration of a solid nodule in the superior segment left lower lobe this measures 12 x 14 x 18 mm, previously 8 x 11 x 9 mm reflecting interval growth however the attenuation of this structure closely matches that of simple fluid. Overall indeterminate though malignancy not fully excluded. Consider continued follow-up imaging if felt to be clinically warranted. 7. Fat and bowel containing left groin hernia without evidence of mechanical obstruction or strangulation at this level. 8. Additional fat  containing  upper abdominal ventral hernia with fatty stranding and trace free fluid concerning for fat strangulation. 9. Diffusely edematous parents of both large and small bowel, could correlate for clinical features of an enterocolitis, either infectious or inflammatory. Critical results were called by telephone at the time of interpretation on 04/18/2020 at 12:58 am to provider Dr. Mellody Dance, who verbally acknowledged these results. Electronically Signed   By: Kreg Shropshire M.D.   On: 04/18/2020 01:24   CT CHEST ABDOMEN PELVIS W CONTRAST  Result Date: 04/18/2020 CLINICAL DATA:  Found down at 19:00 on 04/16/2020, posturing, history of heart disease EXAM: CT HEAD WITHOUT CONTRAST CT CERVICAL SPINE WITHOUT CONTRAST CT CHEST, ABDOMEN AND PELVIS WITH CONTRAST TECHNIQUE: Contiguous axial images were obtained from the base of the skull through the vertex without intravenous contrast. Multidetector CT imaging of the cervical spine was performed without intravenous contrast. Multiplanar CT image reconstructions were also generated. Multidetector CT imaging of the chest, abdomen and pelvis was performed following the standard protocol during bolus administration of intravenous contrast. CONTRAST:  OMNIPAQUE IOHEXOL 300 MG/ML  SOLN COMPARISON:  CT head and chest angiography 11/26/2015 FINDINGS: CT HEAD FINDINGS Brain: Massive intracerebral hemorrhage within the left frontoparietal lobes measuring up to 8 x 5.6 x 5.8 cm in size. Extensive surrounding hypoattenuating edema. Significant mass effect with global sulcal effacement of the left cerebral hemisphere, effacement of the left lateral ventricle, 18 mm of left to right midline shift, medial ization of the uncus and indentation of the contralateral cerebral crus against the right tentorium cerebelli. No other acute sites of intracranial hemorrhage or extra-axial collection. Findings on a background of diffuse parenchymal volume loss. Patchy areas of white matter  hypoattenuation are most compatible with chronic microvascular angiopathy. Vascular: Atherosclerotic calcification of the carotid siphons and intradural vertebral arteries. No hyperdense vessel. Skull: Right temporal and left parieto-occipital scalp swelling and thickening. No subjacent calvarial fractures. Mild edematous changes of the scalp. Sinuses/Orbits: Diffuse thickening in the ethmoids with pneumatized secretions in the posterior nasopharynx and nasal passages. Remaining paranasal sinuses and mastoid air cells are predominantly clear. Middle ear cavities are clear. Orbital structures are unremarkable aside from prior lens extractions. Other: Edentulous.  Transesophageal and endotracheal tubes in place. CT CERVICAL FINDINGS Alignment: Slight reversal of normal cervical lordosis at the C2-3 level. Minimal anterolisthesis C2 on C3 favored to be on a degenerative basis. Similar likely degenerative anterolisthesis noted C7 on T1. No evidence of traumatic listhesis. No abnormally widened, perched or jumped facets. Normal alignment of the craniocervical and atlantoaxial articulations accounting for rightward cranial rotation and slight rightward lateral cervical flexion. Skull base and vertebrae: The osseous structures appear diffusely demineralized which may limit detection of small or nondisplaced fractures. No acute skull base fracture. No vertebral body fracture or height loss. Soft tissues and spinal canal: No prevertebral swelling, fluid or gas. Mild edematous changes noted along the nuchal ligament and cervical musculature posteriorly. Endotracheal and transesophageal tubes are in place. Secretions noted in the posterior naso and oropharynx as imaged. Cervical carotid atherosclerosis. Disc levels: Multilevel intervertebral disc height loss with spondylitic endplate changes. Larger disc osteophyte complexes are present C2-3, C3-4 and C6-7 which partially efface the ventral thecal sac without significant  resulting canal impingement. Multilevel uncinate spurring and facet hypertrophic changes result in mild-to-moderate foraminal narrowing most pronounced at the upper cervical levels. Other: Massively enlarged, heterogeneous multinodular thyroid gland extending inferiorly into the thoracic inlet within overall appearance, similar to comparison CT chest imaging. CT CHEST FINDINGS Cardiovascular: Cardiomegaly  with predominantly biatrial enlargement. Coronary artery calcifications. Small pericardial effusion with fluid in the pericardial recesses. Atherosclerotic plaque within the normal caliber aorta. No acute luminal abnormality of the imaged aorta. No periaortic stranding or hemorrhage. Normal 3 vessel branching of the aortic arch. Shared origin of the brachiocephalic and left common carotid arteries. Splaying of the proximal great vessels with tortuosity secondary to large thyroid extending into the thoracic inlet. Mediastinum/Nodes: Massively enlarged, heterogeneous and multinodular thyroid gland. Extension of the thyroid gland into the thoracic thyroid dimensions. The left lobe thyroid gland measures up to 5.6 x 6.5 x 11.2 cm in conglomerate dimension. More moderate enlargement and nodularity of the right gland. Resulting right paratracheal esophageal shift. Endotracheal tube tip terminates low in the trachea, 1 cm from the carina. Recommend retraction 2 cm to the mid trachea. Transesophageal tube tip terminates in the gastric body with side port distal to the GE junction. This type the presence of the transesophageal tube, the thoracic esophagus is patulous and fluid-filled. No worrisome mediastinal, hilar or axillary adenopathy. Lungs/Pleura: Small bilateral pleural effusions. Mixed dependent and passive atelectatic changes in the lungs. Additional bandlike areas of scarring and/or subsegmental atelectasis. Redemonstration of a solid nodule seen in the superior segment left lower lobe this measures 12 x 14 by 18  mm, previously 8 x 11 x 9 mm reflecting interval growth however the attenuation of this structure closely matches that of simple fluid. Musculoskeletal: Multilevel degenerative changes are present in the imaged portions of the spine. Features most pronounced T10-11. Dextrocurvature of the midthoracic spine. Mild levocurvature at the cervicothoracic junction. Additional degenerative changes in the bilateral shoulders. No acute or worrisome osseous lesion. Extensive circumferential body wall edema noted. CT ABDOMEN PELVIS FINDINGS Hepatobiliary: Some diffuse geographic regions of a [place] hypoattenuation with more focal sparing in the left lobe liver. More uniform enhancement on the delayed phase imaging. Hypoattenuating 1.7 cm lesion in the tip of the left lobe liver with some nodular discontinuous peripheral enhancement and centripetal filling on delayed phase imaging most suggestive of a hepatic hemangioma. Smooth liver surface contour. Normal gallbladder and. No significant biliary ductal dilatation. Pancreas: No pancreatic ductal dilatation or surrounding inflammatory changes. Spleen: Normal in size. No concerning splenic lesions. Adrenals/Urinary Tract: Normal adrenals. Kidneys are normally located with symmetric enhancement and excretion. Few small fluid attenuation cysts are present bilaterally. Additional subcentimeter hypoattenuating foci seen in both kidneys too small to fully characterize on CT imaging but statistically likely benign. There are no suspicious renal lesion, urolithiasis or hydronephrosis. Moderate distention of the urinary bladder without other gross abnormality. Stomach/Bowel: Transesophageal tube tip and side port distal to the GE junction. Some mild thickening towards the gastric antrum postulated to peristaltic motion some mild diffuse mural thickening of both large and small bowel. Multiple to of small bowel loops are seen protruding into a large left groin hernia albeit without  evidence of mechanical obstruction or more focal thickening to suggest strangulation/vascular compromise at this time. No other sites of bowel obstruction. Normal appendix. Scattered colonic diverticula without focal inflammation to suggest diverticulitis. Vascular/Lymphatic: Atherosclerotic calcifications within the abdominal aorta and branch vessels. No aneurysm or ectasia. Enlarged right groin adenopathy with low-attenuation nodes measuring up to 13 mm short axis (3/103). No other enlarged abdominopelvic nodes. Reproductive: Numerous heterogeneously calcified uterine fibroids are present. Ovarian tissue is poorly visualized though no discrete concerning adnexal lesions are seen. Other: Large left groin hernia containing both fat and bowel, as described above. An additional ventral midline fat containing  hernia containing small amount of low-attenuation fluid and extensive stranding of the herniated fat contents suggestive of least mild strangulation reactive change. No other bowel containing hernias. Extensive circumferential body wall edema. Musculoskeletal: Multilevel degenerative changes are present in the imaged portions of the spine. Grade 1 anterolisthesis L4 on 5 without spondylolysis. Partial ankylosis of the L5-S1 levels. Additional moderate degenerative change in the bilateral hips and SI joints. IMPRESSION: CT head: 1. Intracerebral hemorrhage within the left frontoparietal lobes measuring up to 8 x 5.6 x 5.8 cm in size. Extensive surrounding hypoattenuating edema. Significant mass effect with global sulcal effacement of the left cerebral hemisphere, 18 mm of left to right midline shift, medialization of the left uncus and indentation of the contralateral right cerebral crus against the right tentorium cerebelli. 2. Background of diffuse parenchymal volume loss and microvascular angiopathy with intracranial atherosclerosis. 3. Extensive sites of scalp swelling versus edema. Correlate for tenderness.  Findings most pronounced in the right temporal and left parieto-occipital regions. CT cervical spine: 1. No acute fracture or traumatic listhesis of the cervical spine. 2. Diffuse edematous changes of the posterior cervical musculature, left greater than right. Possibly related to more diffuse anasarca changes elsewhere. 3. Multilevel cervical spondylitic changes as detailed above. CT chest, abdomen and pelvis 1. Endotracheal tube tip terminates low in the trachea, 1 cm from the carina. Recommend retraction 2 cm to the mid trachea. 2. Appropriate positioning of the endotracheal tube. 3. Features could suggest underlying CHF/heart failure with cardiomegaly small bilateral effusions and pulmonary vascular congestion. Additional diffuse features of anasarca with circumferential body wall edema. Heterogeneous enhancement of the liver, may reflect some underlying hepatic steatosis or altered hepatic perfusion dynamics or hepatic congestion in the setting of heart failure. 4. Mixed dependent and passive atelectatic changes in the lung bases. 5. Massively enlarged, heterogeneous and multinodular thyroid gland, with extension of the thyroid gland into the thoracic inlet. 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 Similar appearance to comparison exam and in the setting of significant comorbidities or limited life expectancy, no follow-up recommended (ref: J Am Coll Radiol. 2015 Feb;12(2): 143-50). 6. Redemonstration of a solid nodule in the superior segment left lower lobe this measures 12 x 14 x 18 mm, previously 8 x 11 x 9 mm reflecting interval growth however the attenuation of this structure closely matches that of simple fluid. Overall indeterminate though malignancy not fully excluded. Consider continued follow-up imaging if felt to be clinically warranted. 7. Fat and bowel containing left groin hernia without evidence of mechanical obstruction or strangulation at this level. 8. Additional fat containing  upper abdominal ventral hernia with fatty stranding and trace free fluid concerning for fat strangulation. 9. Diffusely edematous parents of both large and small bowel, could correlate for clinical features of an enterocolitis, either infectious or inflammatory. Critical results were called by telephone at the time of interpretation on 04/18/2020 at 12:58 am to provider Dr. Mellody Dance, who verbally acknowledged these results. Electronically Signed   By: Kreg Shropshire M.D.   On: 04/18/2020 01:24   DG Chest Portable 1 View  Result Date: 04/24/2020 CLINICAL DATA:  Intubated EXAM: PORTABLE CHEST 1 VIEW COMPARISON:  11/26/2015 chest radiograph and CT FINDINGS: Endotracheal tube tip less than a cm superior to the carina. Tracheal deviation to the right secondary to known enlarged substernal thyroid. Esophageal tube tip below the diaphragm but incompletely visualized. Cardiomegaly with central vascular congestion. Aortic atherosclerosis. No pneumothorax. Possible developing consolidation at the left lung base. IMPRESSION: 1. Endotracheal tube  tip less than a cm superior to the carina. 2. Cardiomegaly with vascular congestion and possible developing consolidation/pneumonia at the left lung base. 3. Left paratracheal mass with deviation of trachea to the right, corresponding to known massively enlarged substernal thyroid. Electronically Signed   By: Jasmine Pang M.D.   On: 04/30/2020 22:49    Microbiology Recent Results (from the past 240 hour(s))  Resp Panel by RT-PCR (Flu A&B, Covid) Nasopharyngeal Swab     Status: None   Collection Time: 30-Apr-2020 10:41 PM   Specimen: Nasopharyngeal Swab; Nasopharyngeal(NP) swabs in vial transport medium  Result Value Ref Range Status   SARS Coronavirus 2 by RT PCR NEGATIVE NEGATIVE Final    Comment: (NOTE) SARS-CoV-2 target nucleic acids are NOT DETECTED.  The SARS-CoV-2 RNA is generally detectable in upper respiratory specimens during the acute phase of infection. The  lowest concentration of SARS-CoV-2 viral copies this assay can detect is 138 copies/mL. A negative result does not preclude SARS-Cov-2 infection and should not be used as the sole basis for treatment or other patient management decisions. A negative result may occur with  improper specimen collection/handling, submission of specimen other than nasopharyngeal swab, presence of viral mutation(s) within the areas targeted by this assay, and inadequate number of viral copies(<138 copies/mL). A negative result must be combined with clinical observations, patient history, and epidemiological information. The expected result is Negative.  Fact Sheet for Patients:  BloggerCourse.com  Fact Sheet for Healthcare Providers:  SeriousBroker.it  This test is no t yet approved or cleared by the [place] FDA and  has been authorized for detection and/or diagnosis of SARS-CoV-2 by FDA under an Emergency Use Authorization (EUA). This EUA will remain  in effect (meaning this test can be used) for the duration of the COVID-19 declaration under Section 564(b)(1) of the Act, 21 U.S.C.section 360bbb-3(b)(1), unless the authorization is terminated  or revoked sooner.       Influenza A by PCR NEGATIVE NEGATIVE Final   Influenza B by PCR NEGATIVE NEGATIVE Final    Comment: (NOTE) The Xpert Xpress SARS-CoV-2/FLU/RSV plus assay is intended as an aid in the diagnosis of influenza from Nasopharyngeal swab specimens and should not be used as a sole basis for treatment. Nasal washings and aspirates are unacceptable for Xpert Xpress SARS-CoV-2/FLU/RSV testing.  Fact Sheet for Patients: BloggerCourse.com  Fact Sheet for Healthcare Providers: SeriousBroker.it  This test is not yet approved or cleared by the [place] FDA and has been authorized for detection and/or diagnosis of SARS-CoV-2 by FDA under  an Emergency Use Authorization (EUA). This EUA will remain in effect (meaning this test can be used) for the duration of the COVID-19 declaration under Section 564(b)(1) of the Act, 21 U.S.C. section 360bbb-3(b)(1), unless the authorization is terminated or revoked.  Performed at [facility], [address], [place], [place] [address]   Blood culture (routine x 2)     Status: None   Collection Time: Apr 30, 2020 11:22 PM   Specimen: BLOOD LEFT FOREARM  Result Value Ref Range Status   Specimen Description BLOOD LEFT FOREARM  Final   Special Requests   Final    BOTTLES DRAWN AEROBIC ONLY Blood Culture results may not be optimal due to an inadequate volume of blood received in culture bottles   Culture   Final    NO GROWTH 5 DAYS Performed at [facility], [address], [place], [place] [address]    Report Status 04/01/2020 FINAL  Final  Blood culture (routine x 2)     Status: None   Collection Time: 04/18/20  3:12 AM   Specimen: BLOOD  Result Value Ref Range Status   Specimen Description BLOOD LEFT ANTECUBITAL  Final   Special Requests   Final    BOTTLES DRAWN AEROBIC ONLY Blood Culture results may not be optimal due to an inadequate volume of blood received in culture bottles   Culture   Final    NO GROWTH 5 DAYS Performed at [facility], [address], [place], [place] [address]    Report Status 04/23/2020 FINAL  Final  MRSA PCR Screening     Status: None   Collection Time: 04/18/20  3:32 AM   Specimen: Nasopharyngeal  Result Value Ref Range Status   MRSA by PCR NEGATIVE NEGATIVE Final    Comment:        The GeneXpert MRSA Assay (FDA approved for NASAL specimens only), is one component of a comprehensive MRSA colonization surveillance program. It is not intended to diagnose MRSA infection nor to guide or monitor treatment for MRSA infections. Performed at [facility], [address] [place], [place] [address]   Urine culture     Status:  None   Collection Time: 04/18/20  4:25 AM   Specimen: Urine, Random  Result Value Ref Range Status   Specimen Description URINE, RANDOM  Final   Special Requests NONE  Final   Culture   Final    NO GROWTH Performed at [facility], [address], [place], [place] [address]    Report Status 04/19/2020 FINAL  Final    Lab Basic Metabolic Panel: Recent Labs  Lab 04/21/20 0805  NA 153*  K 3.8  CL 117*  CO2 24  GLUCOSE 119*  BUN 18  CREATININE 0.67  CALCIUM 9.0   Liver Function Tests: Recent Labs  Lab 04/21/20 0805  AST 20  ALT 14  ALKPHOS 79  BILITOT 1.2  PROT 6.8  ALBUMIN 2.3*   No results for input(s): LIPASE, AMYLASE in the last 168 hours. No results for input(s): AMMONIA in the last 168 hours. CBC: No results for input(s): WBC, NEUTROABS, HGB, HCT, MCV, PLT in the last 168 hours. Cardiac Enzymes: No results for input(s): CKTOTAL, CKMB, CKMBINDEX, TROPONINI in the last 168 hours. Sepsis Labs: No results for input(s): PROCALCITON, WBC, LATICACIDVEN in the last 168 hours.  Procedures/Operations  2/22 ETT   Bradley L Icard 04/27/2020, 7:42 AM

## 2020-05-29 ENCOUNTER — Ambulatory Visit: Payer: Medicare Other | Admitting: Interventional Cardiology
